# Patient Record
Sex: Female | Born: 1935 | Race: White | Hispanic: No | Marital: Married
Health system: Midwestern US, Academic
[De-identification: ages and names within clinical notes are randomized; demographics above are authoritative.]

---

## 2002-08-08 NOTE — Unmapped (Signed)
Signed by Meryle Ready MD on 08/08/2002 at 00:00:00  [facility]      Imported By: Betsey Amen 11/21/2007 12:07:26    _____________________________________________________________________    External Attachment:    Please see [facility] EMR for this document.

## 2002-10-22 NOTE — Unmapped (Signed)
Signed by Meryle Ready MD on 10/22/2002 at 00:00:00  [facility]      Imported By: Betsey Amen 11/21/2007 12:05:17    _____________________________________________________________________    External Attachment:    Please see [facility] EMR for this document.

## 2003-02-21 NOTE — Unmapped (Signed)
Signed by Meryle Ready MD on 02/21/2003 at 00:00:00  PET Scan      Imported By: Betsey Amen 11/21/2007 13:11:02    _____________________________________________________________________    External Attachment:    Please see [facility] EMR for this document.

## 2005-04-12 NOTE — Unmapped (Signed)
Signed by Elberta Fortis on 04/12/2005 at 13:02:38    Phone Note   Patient Call  Call back at Home Phone: [phone]  Caller: patient  Department: [facility]  Call for: RADIOLOGY     Summary of Call: PATIENT WOULD LIKE MRI STUDY'S DONE ON 08.31.04 AND 02.07.05 AND FORWARDED TO DR. BOB LUKIN CHAIR AT [facility].  HE WOULD LIKE TO COMPAIRED WITH STUDY DONE A FEW DAYS AGO.      LW [id number] WOULD LIKE TO BE NOTIFIED WHEN COPIED AND WOULD LIKE TO KNOW IF MESSENGER CAN FORWARD.     Initial call taken by: Elberta Fortis,  April 12, 2005 1:01 PM    This department is not currently using the EMR system. Please see the paper chart for this patient for the response to this phone message.

## 2005-05-27 ENCOUNTER — Inpatient Hospital Stay

## 2005-05-27 NOTE — Unmapped (Signed)
Signed by   LinkLogic on 05/27/2005 at 14:24:36  Patient: Ashley Madden  Note: All result statuses are Final unless otherwise noted.    Tests: (1) NM-BONE IMAGING WHOLE BODY [id number])    Order NotePricilla Handler Order Number: [id number]    Order Note:     *** VERIFIED [facility]  Reason:  TC-72M MEDRONATE I.V. REPORT RESULTS TO DR.  Shalise Rosado FAX: [phone]  Dict.Staff: Lovey Newcomer    Verified By: Onalee Hua   Ver: 05/27/05   2:24 pm  Exams:  NM-BONE IMAGING WHOLE BODY      Clinical: Patient has history of breast cancer and is referred  for evaluation of possible metastatic disease.    Technical: Whole-body images were obtained following the  administration of the radionuclide intravenously.    Findings: Images demonstrate the expected normal distribution of  the radionuclide in the skull, pelvis, the rib cage including  the sternum and long bones of the extremities. There is  scoliosis involving the Mauri Tolen thoracic and lumbar spine  identified. There is increased uptake noted in the distal lumbar  spine more pronounced on the left.    There is evidence of arthritis in the cervical spine and the  extremities.    Impression: This is  an unremarkable bone scan without evidence  to suggest presence of metastatic disease. Findings in the Reona Zendejas  spine are consistent with spondylosis secondary to this  patient's pronounced scoliosis.  **** end of result ****    Note: An exclamation mark (!) indicates a result that was not dispersed into   the flowsheet.  Document Creation Date: 05/27/2005 2:24 PM  _______________________________________________________________________    (1) Order result status: Final  Collection or observation date-time: 05/27/2005 13:30:00  Requested date-time: 05/27/2005 10:54:00  Receipt date-time:   Reported date-time: 05/27/2005 14:24:30  Referring Physician: Dionisio Paschal Diangelo Radel  Ordering Physician: Dionisio Paschal Chastity Noland (LOWERE)  Specimen Source:   Source: QRS  Filler Order Number:  [id number]  Lab site: [facility]

## 2006-08-29 ENCOUNTER — Inpatient Hospital Stay

## 2006-08-29 NOTE — Unmapped (Signed)
Signed by   LinkLogic on 08/29/2006 at 15:52:20  Patient: Ashley Madden  Note: All result statuses are Final unless otherwise noted.    Tests: (1) CT-PELVIS WITH CONTRAST [phone])    Order NotePricilla Handler Order Number: [id number]    Order Note:     *** VERIFIED [facility]  Reason:  BREAST CA,NON-HODGKINS LYMPHOMA  Dict.Staff: Lum Babe [phone]  Dict.Res: Leda Roys [id number]  Verified By: Lum Babe    Ver: 08/29/06   3:52 pm  Exams:  CT-ABDOMEN WITH CONTRAST  CT-PELVIS WITH CONTRAST    Exam: CT of the abdomen and pelvis with contrast performed on  08/29/2006    Comparison: None available    Indication: Breast cancer, non-Hodgkin's lymphoma    Technical factors: Following administration of 150 mL Isovue-370  intravenous contrast and water soluble oral contrast helically  acquired axial multidetector CT images were obtained from the  lung bases to the pelvis and reconstructed in contiguous 5 mm  sections using a 36 cm field-of-view.    Findings:    Chest findings will be reported separately.    The liver, adrenal glands, kidneys and pancreas are normal in  appearance.    The spleen is mildly enlarged.    Visualized portions of the bowel are normal in appearance.    There is no mesenteric or retroperitoneal adenopathy.    Atherosclerotic calcification of the origins of the celiac and  SMA are seen.    There are extensive degenerative changes in the lumbar spine. No  suspicious osteolytic or osteoblastic lesions are seen.    Impression:    Abdomen:  1. Mild splenomegaly.    Pelvis:  1. No evidence of metastatic disease.  **** end of result ****    Note: An exclamation mark (!) indicates a result that was not dispersed into   the flowsheet.  Document Creation Date: 08/29/2006 3:52 PM  _______________________________________________________________________    (1) Order result status: Final  Collection or observation date-time: 08/29/2006 10:20:43  Requested date-time: 08/29/2006  09:31:00  Receipt date-time:   Reported date-time: 08/29/2006 15:52:15  Referring Physician: Dionisio Paschal Katherene Dinino  Ordering Physician: Dionisio Paschal Ethel Veronica Sherryl Manges)  Specimen Source:   Source: QRS  Filler Order Number: [id number]  Lab site: [facility]

## 2006-08-29 NOTE — Unmapped (Signed)
Signed by   LinkLogic on 08/29/2006 at 15:02:17  Patient: Ashley Madden  Note: All result statuses are Final unless otherwise noted.    Tests: (1) CT-CHEST W CONTRAST [phone])    Order NotePricilla Handler Order Number: [id number]    Order Note:     *** VERIFIED [facility]  Reason:  BREAST CA,NON-HODGKINS LYMPHOMA  Dict.Staff: Horton Finer [phone]  Dict.Res: Henderson Cloud [id number]  Verified By: Shyrl Numbers A    Ver: 08/29/06   3:02 pm  Exams:  CT-CHEST W CONTRAST      CT of the chest with contrast dated 08/29/2006    Indication:  Breast cancer and non-Hodgkin's lymphoma.    Comparison:  None available.    Technique:  Helically acquired axial multidetector CT images  were obtained from the apices through the upper abdomen  following the administration of 150 ml of Isovue 370 intravenous  contrast  with a field-of-view of 36.  Images were reconstructed  at 5 mm.    Findings:    The central airways are widely patent.    Minimal irregular subpleural linear opacities are seen in  apices, likely postinflammatory change. These were seen on the  prior cervical spine CT in 2004. Parenchymal hands are seen the  bases bilaterally. No masses or pulmonary nodules are identified.    The patient is status post left mastectomy. There are  calcifications in the left anterior descending artery. The  pericardium and great vessels are normal in appearance.  No  mediastinal, hilar or axillary lymphadenopathy is identified.    No osteolytic or osteoblastic bone lesions are identified.    Abdomen and pelvis findings will be reported separately.    Impression:    No evidence of intrathoracic metastatic disease.  **** end of result ****    Note: An exclamation mark (!) indicates a result that was not dispersed into   the flowsheet.  Document Creation Date: 08/29/2006 3:02 PM  _______________________________________________________________________    (1) Order result status: Final  Collection or observation date-time:  08/29/2006 10:20:39  Requested date-time: 08/29/2006 09:31:00  Receipt date-time:   Reported date-time: 08/29/2006 15:02:25  Referring Physician: Dionisio Paschal Faylene Allerton  Ordering Physician: Dionisio Paschal Cagney Steenson Sherryl Manges)  Specimen Source:   Source: QRS  Filler Order Number: [id number]  Lab site: [facility]

## 2006-08-29 NOTE — Unmapped (Signed)
Signed by   LinkLogic on 08/29/2006 at 15:52:19  Patient: Ashley Madden  Note: All result statuses are Final unless otherwise noted.    Tests: (1) CT-ABDOMEN WITH CONTRAST [phone])    Order NotePricilla Handler Order Number: [id number]    Order Note:     *** VERIFIED [facility]  Reason:  BREAST CA,NON-HODGKINS LYMPHOMA  Dict.Staff: Lum Babe [phone]  Dict.Res: Leda Roys [id number]  Verified By: Lum Babe    Ver: 08/29/06   3:52 pm  Exams:  CT-ABDOMEN WITH CONTRAST  CT-PELVIS WITH CONTRAST    Exam: CT of the abdomen and pelvis with contrast performed on  08/29/2006    Comparison: None available    Indication: Breast cancer, non-Hodgkin's lymphoma    Technical factors: Following administration of 150 mL Isovue-370  intravenous contrast and water soluble oral contrast helically  acquired axial multidetector CT images were obtained from the  lung bases to the pelvis and reconstructed in contiguous 5 mm  sections using a 36 cm field-of-view.    Findings:    Chest findings will be reported separately.    The liver, adrenal glands, kidneys and pancreas are normal in  appearance.    The spleen is mildly enlarged.    Visualized portions of the bowel are normal in appearance.    There is no mesenteric or retroperitoneal adenopathy.    Atherosclerotic calcification of the origins of the celiac and  SMA are seen.    There are extensive degenerative changes in the lumbar spine. No  suspicious osteolytic or osteoblastic lesions are seen.    Impression:    Abdomen:  1. Mild splenomegaly.    Pelvis:  1. No evidence of metastatic disease.  **** end of result ****    Note: An exclamation mark (!) indicates a result that was not dispersed into   the flowsheet.  Document Creation Date: 08/29/2006 3:52 PM  _______________________________________________________________________    (1) Order result status: Final  Collection or observation date-time: 08/29/2006 10:20:35  Requested date-time: 08/29/2006  09:31:00  Receipt date-time:   Reported date-time: 08/29/2006 15:52:15  Referring Physician: Dionisio Paschal Joclyn Alsobrook  Ordering Physician: Dionisio Paschal Ellanie Oppedisano Sherryl Manges)  Specimen Source:   Source: QRS  Filler Order Number: [id number]  Lab site: [facility]

## 2006-08-29 NOTE — Unmapped (Signed)
Signed by   LinkLogic on 08/30/2006 at 13:12:40  Patient: Ashley Madden  Note: All result statuses are Final unless otherwise noted.    Tests: (1) MRI-L-SPINE W/WO CONTRAST [id number])    Order NotePricilla Handler Order Number: [id number]    Order Note:     *** VERIFIED [facility]  Reason:  HX OF BREAST CA, LYMPHOMA, F/U SCAN  Dict.Staff: Ursula Alert [phone]  Dict.Res: Nash Dimmer  Verified By: Burt Ek A      Ver: 08/30/06   1:12 pm  Exams:  MRI-L-SPINE W/WO CONTRAST  MRI-T-SPINE W/WO CONTRAST    Exam: MRI of thoracic and lumbar spine with and without contrast.    History: History of breast cancer and lymphoma for followup.    Comparison: 02 May 2003.    Technique: Multiplanar multiecho imaging of the thoracic and  lumbar spine was performed pre- and post uneventful  administration of 13 mL Magnevist IV.    Findings: Thoracic spine    On the scout images, there is mild retrolisthesis of C5 with  respect to C6. Spondylitic changes are seen within the cervical  spine, not directly imaged. There is normal anatomic alignment  of the thoracic spine. There are multilevel endplate  irregularities, consistent Schmorl's nodes.  A likely hemangioma  is seen within the T9 vertebral body, stable.    Since the prior examination, there is a new superior endplate  compression deformity involving the T4 vertebral body.There is a  region of decreased T1 and increased T2 signal intensity within  the T3 vertebral body which enhances after contrast  administration.  No additional abnormal regions of enhancement  are seen. These findings are improved from previous examination.    Axial imaging through the thoracic spine demonstrates the  following:    T6-T7: There is a left central disc herniation with flattening  of the ventral thecal sac and cord on the left. This appears  essentially stable from previous examination.    T7/8: There is a mild right paracentral disc osteophyte complex  which mildly  flattens the ventral thecal sac. This also appears  stable.    Impressions:  1. Region of enhancement involving the T3 vertebral body. No  additional abnormal regions of enhancement are seen. These  findings are improved from previous examination.  2. New superior endplate compression deformity involving T4.  3. Disc disease at T6-T7 and T7/8. There is flattening of the  ventral sac and cord at the T6/7 level, stable.    Findings: Lumbar spine    There is convex-right curvature of the lumbar spine. Alignment  is otherwise maintained without fracture or dislocation. There  are multilevel Schmorl's nodes, as well as endplate degenerative  changes at L4/5 and L5/S1. Marrow signal is otherwise  unremarkable for age without evidence of infiltrative process.  There is no abnormal enhancement postcontrast administration.  There is multilevel disc desiccation with narrowing of the  intervertebral disc space at L4/5. A root sleeve diverticulum  versus cyst is seen the S2/3 level. The conus is normal in size,  configuration, and signal intensity, terminating at L1/2. There  is slight disc bulge at L1/2.    Axial imaging through the intervertebral disc spaces  demonstrates the following:    L2/3: There is a diffuse disc bulge which extends into the right      neural foramina. This, combined with left greater than right  facet arthropathy and minimal ligamentum flavum infolding,  results in moderate left and  mild right neural foraminal  narrowing. In addition, there is mild left and minimal right  noncompressive lateral recess compromise.    L3/4: There is a diffuse disc bulge which combined with mild  facet and ligamentum flavum hypertrophy results in the mild to  moderate left and mild right neural foraminal narrowing. There  is minimal bilateral noncompressive lateral recess compromise.    L4/5: There is a diffuse disc bulge with right greater than left  facet arthropathy. This results in moderate right and mild left  neural  foraminal narrowing, as well as minimal right  noncompressive lateral recess compromise.    L5/S1: There are diffuse hypertrophic degenerative changes with  superimposed left foraminal disc protrusion. In addition, there  is a right greater than left facet arthropathy. These changes  result in moderate left neural foraminal narrowing, as well as  minimal noncompressive bilateral lateral recess compromise.    Impressions:  1. No evidence of marrow infiltrative process or abnormal  enhancement within the lumbar spine.  2. Multilevel disc disease as described, most pronounced at  L5/S1.  **** end of result ****    Note: An exclamation mark (!) indicates a result that was not dispersed into   the flowsheet.  Document Creation Date: 08/30/2006 1:12 PM  _______________________________________________________________________    (1) Order result status: Final  Collection or observation date-time: 08/29/2006 14:08:34  Requested date-time: 08/29/2006 12:05:00  Receipt date-time:   Reported date-time: 08/30/2006 13:12:48  Referring Physician: Dionisio Paschal Stevana Dufner  Ordering Physician: Dionisio Paschal Charly Hunton Sherryl Manges)  Specimen Source:   Source: QRS  Filler Order Number: [id number]  Lab site: [facility]

## 2006-08-29 NOTE — Unmapped (Signed)
Signed by   LinkLogic on 08/30/2006 at 13:12:40  Patient: Ashley Madden  Note: All result statuses are Final unless otherwise noted.    Tests: (1) MRI-T-SPINE W/WO CONTRAST [phone])    Order NotePricilla Handler Order Number: [id number]    Order Note:     *** VERIFIED [facility]  Reason:  HX OF BREAST CA, LYMPHOMA, F/U SCAN  Dict.Staff: Ursula Alert [phone]  Dict.Res: Nash Dimmer  Verified By: Burt Ek A      Ver: 08/30/06   1:12 pm  Exams:  MRI-L-SPINE W/WO CONTRAST  MRI-T-SPINE W/WO CONTRAST    Exam: MRI of thoracic and lumbar spine with and without contrast.    History: History of breast cancer and lymphoma for followup.    Comparison: 02 May 2003.    Technique: Multiplanar multiecho imaging of the thoracic and  lumbar spine was performed pre- and post uneventful  administration of 13 mL Magnevist IV.    Findings: Thoracic spine    On the scout images, there is mild retrolisthesis of C5 with  respect to C6. Spondylitic changes are seen within the cervical  spine, not directly imaged. There is normal anatomic alignment  of the thoracic spine. There are multilevel endplate  irregularities, consistent Schmorl's nodes.  A likely hemangioma  is seen within the T9 vertebral body, stable.    Since the prior examination, there is a new superior endplate  compression deformity involving the T4 vertebral body.There is a  region of decreased T1 and increased T2 signal intensity within  the T3 vertebral body which enhances after contrast  administration.  No additional abnormal regions of enhancement  are seen. These findings are improved from previous examination.    Axial imaging through the thoracic spine demonstrates the  following:    T6-T7: There is a left central disc herniation with flattening  of the ventral thecal sac and cord on the left. This appears  essentially stable from previous examination.    T7/8: There is a mild right paracentral disc osteophyte complex  which mildly  flattens the ventral thecal sac. This also appears  stable.    Impressions:  1. Region of enhancement involving the T3 vertebral body. No  additional abnormal regions of enhancement are seen. These  findings are improved from previous examination.  2. New superior endplate compression deformity involving T4.  3. Disc disease at T6-T7 and T7/8. There is flattening of the  ventral sac and cord at the T6/7 level, stable.    Findings: Lumbar spine    There is convex-right curvature of the lumbar spine. Alignment  is otherwise maintained without fracture or dislocation. There  are multilevel Schmorl's nodes, as well as endplate degenerative  changes at L4/5 and L5/S1. Marrow signal is otherwise  unremarkable for age without evidence of infiltrative process.  There is no abnormal enhancement postcontrast administration.  There is multilevel disc desiccation with narrowing of the  intervertebral disc space at L4/5. A root sleeve diverticulum  versus cyst is seen the S2/3 level. The conus is normal in size,  configuration, and signal intensity, terminating at L1/2. There  is slight disc bulge at L1/2.    Axial imaging through the intervertebral disc spaces  demonstrates the following:    L2/3: There is a diffuse disc bulge which extends into the right      neural foramina. This, combined with left greater than right  facet arthropathy and minimal ligamentum flavum infolding,  results in moderate left and  mild right neural foraminal  narrowing. In addition, there is mild left and minimal right  noncompressive lateral recess compromise.    L3/4: There is a diffuse disc bulge which combined with mild  facet and ligamentum flavum hypertrophy results in the mild to  moderate left and mild right neural foraminal narrowing. There  is minimal bilateral noncompressive lateral recess compromise.    L4/5: There is a diffuse disc bulge with right greater than left  facet arthropathy. This results in moderate right and mild left  neural  foraminal narrowing, as well as minimal right  noncompressive lateral recess compromise.    L5/S1: There are diffuse hypertrophic degenerative changes with  superimposed left foraminal disc protrusion. In addition, there  is a right greater than left facet arthropathy. These changes  result in moderate left neural foraminal narrowing, as well as  minimal noncompressive bilateral lateral recess compromise.    Impressions:  1. No evidence of marrow infiltrative process or abnormal  enhancement within the lumbar spine.  2. Multilevel disc disease as described, most pronounced at  L5/S1.  **** end of result ****    Note: An exclamation mark (!) indicates a result that was not dispersed into   the flowsheet.  Document Creation Date: 08/30/2006 1:12 PM  _______________________________________________________________________    (1) Order result status: Final  Collection or observation date-time: 08/29/2006 14:08:45  Requested date-time: 08/29/2006 12:05:00  Receipt date-time:   Reported date-time: 08/30/2006 13:12:48  Referring Physician: Dionisio Paschal Devlynn Knoff  Ordering Physician: Dionisio Paschal Loriana Samad Sherryl Manges)  Specimen Source:   Source: QRS  Filler Order Number: [id number]  Lab site: [facility]

## 2007-02-20 NOTE — Unmapped (Signed)
Signed by   LinkLogic on 02/20/2007 at 14:58:28    Appointment status changed to no show by  LinkLogic on 02/20/2007 2:58 PM.    No Show Comments  ----------------  (MOF) ELECTROMYOGRAPHY    Appointment Information  -----------------------  Appt Type:         Date:  Monday, February 20, 2007       Time:  2:45 PM for 45 min    Urgency:  Routine    Made By:  Berton Lan   To Visit:  EMG PROVIDER     Reason:  (MOF) ELECTROMYOGRAPHY    Appt Comments  -------------  -- 02/20/07 14:58: (DODDSL) NO SHOW --  (MOF) ELECTROMYOGRAPHY  EM[facility] LUE DX: NUMBNESS  FAX: [phone]    -- 02/13/07 10:37: Art Buff) BOOKED --  Routine  at 02/20/2007 2:45 PM for 45 min  (MOF) ELECTROMYOGRAPHY  EMG LUE DX: NUMBNESS  FAX: [phone]

## 2007-06-30 NOTE — Unmapped (Signed)
Signed by Meryle Ready MD on 06/30/2007 at 00:00:00  Office Visit      Imported By: Betsey Amen 11/21/2007 12:07:59    _____________________________________________________________________    External Attachment:    Please see [facility] EMR for this document.

## 2007-08-11 NOTE — Unmapped (Signed)
Signed by Meryle Ready MD on 08/11/2007 at 00:00:00  Office Visit      Imported By: Betsey Amen 11/21/2007 12:07:49    _____________________________________________________________________    External Attachment:    Please see [facility] EMR for this document.

## 2007-09-29 NOTE — Unmapped (Signed)
Signed by Meryle Ready MD on 09/29/2007 at 00:00:00  Office Visit      Imported By: Betsey Amen 11/21/2007 12:07:40    _____________________________________________________________________    External Attachment:    Please see [facility] EMR for this document.

## 2007-10-30 ENCOUNTER — Inpatient Hospital Stay

## 2007-10-30 NOTE — Unmapped (Signed)
Signed by Leanord Hawking Melissaann Dizdarevic MD on 10/31/2007 at 10:40:07  Patient: Ashley Madden  Note: All result statuses are Final unless otherwise noted.    Tests: (1) POC CREATININE (POCCRE)  ! POC Creatinine            0.8 mg/dL                   1.6-1.0    Note: An exclamation mark (!) indicates a result that was not dispersed into   the flowsheet.  Document Creation Date: 10/30/2007 2:44 PM  _______________________________________________________________________    (1) Order result status: Final  Collection or observation date-time: 10/30/2007 13:02  Requested date-time: 10/30/2007 13:02  Receipt date-time: 10/30/2007 14:48  Reported date-time: 10/30/2007 14:44  Referring Physician: Dionisio Paschal Okechukwu Regnier  Ordering Physician: Dionisio Paschal Kenedee Molesky [phone])  Specimen Source: VB&BLOOD, VENOUS     SYG&SYRINGE  Source: Faith Rogue Order Number: [id number]  Lab site: [facility]      [address] [place] [address]  [phone]

## 2007-10-30 NOTE — Unmapped (Signed)
Signed by Meryle Ready MD on 10/30/2007 at 00:00:00  CT Chest      Imported By: Betsey Amen 11/21/2007 12:04:31    _____________________________________________________________________    External Attachment:    Please see [facility] EMR for this document.

## 2007-11-01 ENCOUNTER — Inpatient Hospital Stay

## 2007-11-01 NOTE — Unmapped (Signed)
Signed by   LinkLogic on 11/01/2007 at 11:37:50  Patient: Ashley Madden  Note: All result statuses are Final unless otherwise noted.    Tests: (1) MRI-T-SPINE W CONTRAST [phone])    Order NotePricilla Handler Order Number: [id number]    Order Note:     *** VERIFIED [facility]  Reason:  BREAST CA  Dict.Staff: Jena Gauss [phone]    Verified By: Jena Gauss           Ver: 11/01/07  11:37 am  Exams:  MRI-L-SPINE W CONTRAST    Patient name: Ashley Madden    Examination: MR of the thoracic and lumbar spine with contrast  T1-weighted sequences only    Person: Previous noncontrast MR of the thoracic and lumbar spine  dated October 30, 2006, and previous MR's of the thoracic and  lumbar spine from 08/29/1998 88    History: Patient with known breast carcinoma and diffuse  metastasis related to this. Develops second primary of lymphoma.  Prior exam 2 days ago showed a T3 paraspinal soft tissue mass.    Findings:    MR of THORACIC SPINE --  As previously demonstrated left paraspinal, extra  canalicular soft tissue mass at the T3 level demonstrates  homogeneous abnormal contrast-enhancement and confirms that this  tumor is extended into the left T3/4 neural foramen and presents  as an intradural extramedullary lesion on the left side causing  mild compression and displacement of the thoracic spinal cord.  The appearance and location of this soft tissue mass would tend  to favor the known diagnosis of lymphoma.    There are considerable pulsation artifacts from the sagittal T1  fat-saturated postcontrast enhanced images making it difficult  to assess degree of enhancement in the thoracic vertebra.  However, the precontrast T1 sagittal images demonstrates  heterogeneity of marrow signal also seen on the previous study  from October 12. Furthermore, on the previous sagittal  fat-saturated fast spin-echo images, there is also inhomogeneity  of marrow signal with multifocal areas of increased T2 signal as  well as what  appears to be hemangioma of the T9 vertebral body.  It cannot be stated with certainty whether or not there is  abnormal enhancement of the thoracic vertebral bodies due to the  extensive pulsation artifacts. No definite abnormal enhancement  of the vertebral bodies is noted on the sagittal postcontrast  enhanced images without fat saturation. Nevertheless, the MR  does suggest heterogeneity of the thoracic marrow diffusely  perhaps related to infiltrating process, possibly related to  patient's known breast carcinoma. This inhomogeneity is also  noted on previous exams. However, the heights of the thoracic  vertebral bodies, and cortical margins as well as disc spaces  are preserved. There is no associated soft tissue mass extending  out from the vertebral bodies except for the left paraspinal  soft tissue mass at the T3 level.    Impression:    Enhancement of T3 left paraspinal soft tissue mass with  extension into foramen into the spinal canal most consistent  with patient's known diagnosis of lymphoma. Heterogeneity of the  thoracic vertebral marrow suggestive of infiltrative process.  This heterogeneity of the thoracic vertebral marrow is also  noted in 08/29/2006 and is basically unchanged    MR of LUMBAR SPINE--  There is again noted unchanged since 2005 a Tarlov's cyst,  or more likely an arachnoid cyst at the S2 level causing  scalloping of the posterior cortical margin. There does not  appear to be any abnormal contrast enhancement of the conus  medullaris or of the roots of the cauda equina. There does    appear to be patchy focal areas of abnormal enhancement in the  lumbar vertebral bodies on the fat saturation images as well as  some heterogeneity of signal. The enhancement does occur in  regions of Schmorl's nodes, and may just represent degenerative  changes. However, on the previous exam from October 12 and  T2-weighted fat-saturated images there also appears to be  heterogeneity diffusely of the  lumbar vertebra with focal areas  of increased T2 signal. The increased T2 signal heterogeneity is  seen much better on the sagittal topogram localizers in the  cervical thoracic and lumbar regions. Therefore, the patchy  areas of enhancement in the lumbar vertebral bodies raises the  question of a diffuse infiltrative process involving the lumbar  spine. However, the cortical margins of the lumbar vertebral  bodies are intact, and there is no evidence for soft tissue  tumor extending out from the margins of the bodies. These  heterogeneous changes in the lumbar spine were also noted back  and 08/29/2006 and are basically unchanged.    Impression:    No definite contrast enhancing mass lesions within the lumbar  spinal canal or related to the conus or roots of cauda equina.  Again, there is suggestion of heterogeneity of the lumbar  vertebral marrow suggesting infiltrative process, most likely  related to patient's previous breast cancer.  **** end of result ****    Order Note:   EMR Routing to: Alfredia Client, CNP - ordering - [id number]    Note: An exclamation mark (!) indicates a result that was not dispersed into   the flowsheet.  Document Creation Date: 11/01/2007 11:37 AM  _______________________________________________________________________    (1) Order result status: Final  Collection or observation date-time: 11/01/2007 08:51:15  Requested date-time: 11/01/2007 06:05:00  Receipt date-time:   Reported date-time: 11/01/2007 11:37:33  Referring Physician: Lenor Coffin  Ordering Physician: Lenor Coffin Algernon Huxley)  Specimen Source:   Source: QRS  Filler Order Number: [id number]  Lab site: [facility]

## 2007-11-01 NOTE — Unmapped (Signed)
Signed by   LinkLogic on 11/01/2007 at 11:37:49  Patient: Ashley Madden  Note: All result statuses are Final unless otherwise noted.    Tests: (1) MRI-L-SPINE W CONTRAST [phone])    Order NotePricilla Handler Order Number: [id number]    Order Note:     *** VERIFIED [facility]  Reason:  BREAST CA  Dict.Staff: Jena Gauss [phone]    Verified By: Jena Gauss           Ver: 11/01/07  11:37 am  Exams:  MRI-L-SPINE W CONTRAST    Patient name: Ashley Madden    Examination: MR of the thoracic and lumbar spine with contrast  T1-weighted sequences only    Person: Previous noncontrast MR of the thoracic and lumbar spine  dated October 30, 2006, and previous MR's of the thoracic and  lumbar spine from 08/29/1998 88    History: Patient with known breast carcinoma and diffuse  metastasis related to this. Develops second primary of lymphoma.  Prior exam 2 days ago showed a T3 paraspinal soft tissue mass.    Findings:    MR of THORACIC SPINE --  As previously demonstrated left paraspinal, extra  canalicular soft tissue mass at the T3 level demonstrates  homogeneous abnormal contrast-enhancement and confirms that this  tumor is extended into the left T3/4 neural foramen and presents  as an intradural extramedullary lesion on the left side causing  mild compression and displacement of the thoracic spinal cord.  The appearance and location of this soft tissue mass would tend  to favor the known diagnosis of lymphoma.    There are considerable pulsation artifacts from the sagittal T1  fat-saturated postcontrast enhanced images making it difficult  to assess degree of enhancement in the thoracic vertebra.  However, the precontrast T1 sagittal images demonstrates  heterogeneity of marrow signal also seen on the previous study  from October 12. Furthermore, on the previous sagittal  fat-saturated fast spin-echo images, there is also inhomogeneity  of marrow signal with multifocal areas of increased T2 signal as  well as what  appears to be hemangioma of the T9 vertebral body.  It cannot be stated with certainty whether or not there is  abnormal enhancement of the thoracic vertebral bodies due to the  extensive pulsation artifacts. No definite abnormal enhancement  of the vertebral bodies is noted on the sagittal postcontrast  enhanced images without fat saturation. Nevertheless, the MR  does suggest heterogeneity of the thoracic marrow diffusely  perhaps related to infiltrating process, possibly related to  patient's known breast carcinoma. This inhomogeneity is also  noted on previous exams. However, the heights of the thoracic  vertebral bodies, and cortical margins as well as disc spaces  are preserved. There is no associated soft tissue mass extending  out from the vertebral bodies except for the left paraspinal  soft tissue mass at the T3 level.    Impression:    Enhancement of T3 left paraspinal soft tissue mass with  extension into foramen into the spinal canal most consistent  with patient's known diagnosis of lymphoma. Heterogeneity of the  thoracic vertebral marrow suggestive of infiltrative process.  This heterogeneity of the thoracic vertebral marrow is also  noted in 08/29/2006 and is basically unchanged    MR of LUMBAR SPINE--  There is again noted unchanged since 2005 a Tarlov's cyst,  or more likely an arachnoid cyst at the S2 level causing  scalloping of the posterior cortical margin. There does not  appear to be any abnormal contrast enhancement of the conus  medullaris or of the roots of the cauda equina. There does    appear to be patchy focal areas of abnormal enhancement in the  lumbar vertebral bodies on the fat saturation images as well as  some heterogeneity of signal. The enhancement does occur in  regions of Schmorl's nodes, and may just represent degenerative  changes. However, on the previous exam from October 12 and  T2-weighted fat-saturated images there also appears to be  heterogeneity diffusely of the  lumbar vertebra with focal areas  of increased T2 signal. The increased T2 signal heterogeneity is  seen much better on the sagittal topogram localizers in the  cervical thoracic and lumbar regions. Therefore, the patchy  areas of enhancement in the lumbar vertebral bodies raises the  question of a diffuse infiltrative process involving the lumbar  spine. However, the cortical margins of the lumbar vertebral  bodies are intact, and there is no evidence for soft tissue  tumor extending out from the margins of the bodies. These  heterogeneous changes in the lumbar spine were also noted back  and 08/29/2006 and are basically unchanged.    Impression:    No definite contrast enhancing mass lesions within the lumbar  spinal canal or related to the conus or roots of cauda equina.  Again, there is suggestion of heterogeneity of the lumbar  vertebral marrow suggesting infiltrative process, most likely  related to patient's previous breast cancer.  **** end of result ****    Order Note:   EMR Routing to: Alfredia Client, CNP - ordering - [id number]    Note: An exclamation mark (!) indicates a result that was not dispersed into   the flowsheet.  Document Creation Date: 11/01/2007 11:37 AM  _______________________________________________________________________    (1) Order result status: Final  Collection or observation date-time: 11/01/2007 08:51:17  Requested date-time: 11/01/2007 06:05:00  Receipt date-time:   Reported date-time: 11/01/2007 11:37:33  Referring Physician: Lenor Coffin  Ordering Physician: Lenor Coffin Algernon Huxley)  Specimen Source:   Source: QRS  Filler Order Number: [id number]  Lab site: [facility]

## 2007-11-14 ENCOUNTER — Inpatient Hospital Stay: Attending: Hematology & Oncology

## 2007-11-23 NOTE — Unmapped (Signed)
Signed by Meryle Ready MD on 11/27/2007 at 07:25:19        A partnership of [facility] and [facility] Radiation Oncology      Reason for visit: Consultation  * This consultation was requested by [name], MD to evaluate the possible use of radiation therapy. A copy of this consultation is being forwarded with my opinion and recommendations.  Rad/Onc Provider: Lurline Idol MD      DIAGNOSIS    75 year old white female with 1. Breast cancer, diagnosed in 1998 ER-PR-  2. NHL, low grade B cell lymphoma, diagnosed in 2004      PREVIOUS TREATMENT   1998 - Surgery: left mastectomy  1998 - Chemotherapy: adjuvant chemotherapy per Dr Lower for breast cancer  2004-2009 - Chemotherapy: single agent rituxan every 6 weeks    HISTORY OF PRESENT ILLNESS  Mrs Soltis is a very pleasant 74 yo female we are seeing at the request of Dr Lower to discuss radiation therapy for her NHL.  She has a long history of breast cancer, the details of tx we do not have available.  She was diagnosed in 1998 and had a left mastectomy by Dr Eppie Gibson. She then had adjuvant chemotherapy per Dr Lower. She states her LN were not involved, and the tumor was ER-.  She then did well and was followed routinely by her doctors. She the developed significant neck pain in 2004. Apparently at this time imaging revealed an abnormality in her C5-6 and T12region and iliac crest.  She had bone marrow biopsies in 7/04 which was unable to ro underlying B cell lymphoproliferative disorder; She then had biopsy of her T12 vertebral body and this was confirmed as Low Grade B Cell Lymphoma.  Pet scan in 2/05 showed uptake in mid thoracic spine, lower left cerival spine, and possible subcarinal and hilar disease.  At this time she was started on single agent rituxan. She has taken this every 6 weeks for the past 5 years, and has tolerated it very well.  Her scans from 5/07-8/08 have not shown any significant disease. She then saw Dr Lower in 9/09 and had her routine  scans. This showed a new area at T3-4. MRI was then performed on 11/01/07 and showed a soft tissue mass from T3-4 w/ intradural extension and mild cord compression.  This was thought to favor lymphoma. She no other significant disease, aside from T2 herterogenitety in cervical and lumbar spine. She then had a CT guided biopsy by Dr Caleen Jobs which revealed benign lymphocytes and no malignancy. She then had a PET-CT on 11/2 which only showed disease in the T3 region, SUV only 2.4. There was no other evidence of disease.  She currently states she is feeling well. She has no pain in her back aside from chronic arthritis. She has numb/tingling fingers in her left hand, but states this has been like this for about 8 months. She is not having any change in bowels or bladder. She has had nocturia and some occasional incontinence for years and years.  She has no fevers, chills, nightsweats, or weight loss. She is on decadron 4 mg twice a day and is not tolerating this well. She feels off, dizzy, and not like herself.    Past History  Past Medical History (reviewed - no changes required):  Hyperlipidemia, Hypothyroidism, Cataracts, Eye Glasses, Shingles, Arthritis, Primary Breast cancer  Surgical History (reviewed - no changes required):  Appendectomy:*, Hysterectomy: *, Mastectomy: left, Knee Replacement: left  Social History (reviewed - no changes required): Marital Status: married,   Spouse: Norbert  Children: 7,   Employment Status: retired,   Patient Lives at: condo,   Exercise: lives in [place] half the year,   Tobacco Usage:non-smoker      REVIEW OF SYSTEMS   Ten system review (General/ Eyes/ ENT/ Cardiovascular/ Respiratory/ Gastrointestinal/ Genitourinary/ Musculoskeletal/ Skin and/ Neurologic) is negative except as noted in the HPI.        MEDICATIONS (on Intake):    SYNTHROID  TABS (LEVOTHYROXINE SODIUM TABS)   DEXAMETHASONE 4 MG TABS (DEXAMETHASONE) twice a day  ATENOLOL 50 MG TABS (ATENOLOL)   PREVACID   CPDR (LANSOPRAZOLE CPDR)   EVISTA 60 MG TABS (RALOXIFENE HCL)   ADULT ASPIRIN EC LOW STRENGTH 81 MG TBEC (ASPIRIN)   CITRACAL PLUS  TABS (MULTIPLE MINERALS-VITAMINS)         PHYSICAL EXAMINATION  Weight: 144 lbs./  0 kg.,   Temp. 98.1,   BP: 118/ 69 mm Hg,   Pulse Rate: 65,   Resp Rate: 16     Pain Score: 0       Constitutional: alert, no acute distress, well hydrated, well developed, well nourished;    Skin: normal turgor, normal color, no rashes;    Head: atraumatic, normocephalic;    Eyes: EOM intact, pupils equally reactive to light, normal light reaction, no nystagmus;    Ears/Nose/Throat: no pharyngeal erythema, tongue normal;    Neck: supple, no lymphadenopathy, no scars;    Chest: no chest wall tenderness, clear to auscultation, normal breath sounds, no dullness or hyperresonsance;    Cardiac:  regular rate and rhythm, no rubs, no murmurs;    Peripheral circulation: no cyanosis, no edema;    Extremities: full joint motion, no deformities, normal sensation;    Psych: oriented to time; place; and person, good eye contact, memory intact, normal cognition;    Neuro: cranial nerves grossly intact, DTR's normal, non focal, sensation intact, motor intact, speech intact, station & gait normal, appropriate mental status;  Cervical Nodes: normal  Clavicular Nodes normal  Axillary Nodes: normal          ASSESSMENT   Mrs Hamm is a 74 yo WF w/ history of breast cancer and NHL. She now has presumed progression of lymphoma w/ a paraspinal mass at T3-T4.    Disease Status:   local recurrence of disease      PLAN   We have recommended that she have radiation to her paraspinal mass.  She will get a total dose of 36 gray. We have discussed that potential side effects would be fatigue, skin irritation, and possible sore throat.  She would like to finish tx as soon as possible to get to [place].  We will set up a ct simulation tomorrow and begin tx monday. She will decrease her decadron to 2 mg three times a day.    PRECEPTOR  ACKNOWLEDGEMENTS    I saw and examined the patient.  I discussed with resident and agree with resident's findings and plan as documented in resident's note.    Comments: Mrs. Gannett is a delightful 75 year old white female, with a recurrent spinal lymphoma. On our planning CT, there is very clearly a threatenin cord compression.  We will begin her treatment immediately, and treat her over a course of 20 fractions in 4 weeks time.  Preceptor: Meryle Ready MD on November 27, 2007 7:25 AM

## 2007-11-24 ENCOUNTER — Inpatient Hospital Stay

## 2007-11-24 NOTE — Unmapped (Signed)
Signed by Meryle Ready MD on 11/24/2007 at 00:00:00  Procedure Consent      Imported By: Eather Colas Hummons 12/28/2007 11:09:32    _____________________________________________________________________    External Attachment:    Please see [facility] EMR for this document.

## 2007-11-24 NOTE — Unmapped (Signed)
Signed by   LinkLogic on 11/25/2007 at 10:32:33  Patient: Ashley Madden  Note: All result statuses are Final unless otherwise noted.    Tests: (1) CT-THERAPY FIELDS [id number])    Order NotePricilla Handler Order Number: [id number]    Order Note:     *** VERIFIED [facility]  Reason:  CT NECK,CHEST FOR TX PLANNING,DX:LYMPHOMA  Dict.Staff: Gwenyth Ober [phone]    Verified By: Gwenyth Ober      Ver: 11/25/07  10:32 am  Exams:  CT-THERAPY FIELDS      CT Scan performed on Nov 24, 2007 8:13:00 AM :    INDICATION:    Radiation Therapy Planning.    A CT Scan was performed for the purpose of Radiation Therapy  Planning.  Due to the nature of Radiation Therapy CT Scans, no  formal interpretation by a Radiologist will be made.    **** end of result ****    Order Note:   EMR Routing to: Meryle Ready, MD - ordering - [id number]    Note: An exclamation mark (!) indicates a result that was not dispersed into   the flowsheet.  Document Creation Date: 11/25/2007 10:32 AM  _______________________________________________________________________    (1) Order result status: Final  Collection or observation date-time: 11/24/2007 08:14:56  Requested date-time: 11/24/2007 08:13:00  Receipt date-time:   Reported date-time: 11/25/2007 10:32:29  Referring Physician: Cindi Carbon NON-STAFF  Ordering Physician: Lurline Idol [facility])  Specimen Source:   Source: QRS  Filler Order Number: [id number]  Lab site: [facility]

## 2007-11-27 NOTE — Unmapped (Signed)
Signed by Madden Ramp on 11/27/2007 at 10:21:33        A partnership of [facility] and [facility]          November 23, 2007    RE: Ashley Madden   DOB:  [birth year]    Dear Ashley Loser, MD,    It was a pleasure seeing your patient, Ashley Madden at your request for consultation.  Please find the evaluation of this visit below.    DIAGNOSIS:    1. Breast cancer, diagnosed in 1998 ER-PR-  2. NHL, low grade B cell lymphoma, diagnosed in 2004     HISTORY OF PRESENT ILLNESS:   Ashley Madden is a very pleasant 74 yo female we are seeing at the request of Ashley Madden to discuss radiation therapy for her NHL.  She has a long history of breast cancer, the details of tx we do not have available.  She was diagnosed in 1998 and had a left mastectomy by Ashley Madden. She then had adjuvant chemotherapy per Ashley Madden. She states her LN were not involved, and the tumor was ER-.  She then did well and was followed routinely by her doctors. She then developed significant neck pain in 2004. Apparently at this time imaging revealed an abnormality in her C5-6 and T12 region and iliac crest.  She had bone marrow biopsies in 7/04 which were unable to RO underlying B cell lymphoproliferative disorder. She then had biopsy of her T12 vertebral body and this was confirmed as Low Grade B Cell Lymphoma. Pet scan in 2/05 showed uptake in mid thoracic spine, Madden left cerival spine, and possible subcarinal and hilar disease.  At this time, she was started on single agent Rituxan. She has taken this every 6 weeks for the past 5 years, and has tolerated it very well.  Her scans from 5/07-8/08 have not shown any significant disease. She then saw Ashley Madden in 9/09 and had her routine scans. This showed a new area at T3-4. MRI was then performed on 11/01/07 and showed a soft tissue mass from T3-4 w/ intradural extension and mild cord compression.  This was thought to favor lymphoma. She no other significant disease, aside from  T2 herterogenitety in cervical and lumbar spine. She then had a CT guided biopsy by Ashley Ashley Madden which revealed benign lymphocytes and no malignancy. She then had a PET-CT on 11/2 which only showed disease in the T3 region, SUV only 2.4. There was no other evidence of disease. She currently states she is feeling well. She has no pain in her back aside from chronic arthritis. She has numb/tingling fingers in her left hand, but states this has been like this for about 8 months. She is not having any change in bowels or bladder. She has had nocturia and some occasional incontinence for years and years.  She has no fevers, chills, nightsweats, or weight loss. She is on Decadron 4 mg twice a day and is not tolerating this well. She feels off, dizzy, and not like herself.    DISEASE STATUS:  local recurrence of disease     ASSESSMENT:  Ashley Madden is a 74 yo WF w/ history of breast cancer and NHL. She now has presumed progression of lymphoma w/ a paraspinal mass at T3-T4.    PLAN:    We have recommended that she have radiation to her paraspinal mass.  She will get a total dose of 36 gray. We have discussed that  potential side effects would be fatigue, skin irritation, and possible sore throat.  She would like to finish tx as soon as possible to get to [place].  We will set up a ct simulation tomorrow and begin tx on Monday. She will decrease her Decadron to 2 mg three times a day.     Thank you for allowing me to participate in this patient's care.     Kind regards,      Ashley Ready, MD.  Radiation Oncologist

## 2007-11-30 NOTE — Unmapped (Signed)
Signed by Christel Mormon RN on 11/30/2007 at 16:03:51    PRELOAD    Coordinating Care Providers   Referring Physician: [name], MD  PCP Name: Jetty Peeks, MD  Radiation Oncologist: Lurline Idol MD    Diagnosis: 1. Breast cancer, diagnosed in 1998 ER-PR-  2. NHL, low grade B cell lymphoma, diagnosed in 2004      Preload Clinical Lists   Problems:   NON-HODGKIN'S LYMPHOMA (ICD-202.80)  CARCINOMA, BREAST (ICD-174.9)  HYPERTENSION (ICD-401.9)    Medications:   SYNTHROID  TABS (LEVOTHYROXINE SODIUM TABS)   DEXAMETHASONE 4 MG TABS (DEXAMETHASONE) twice a day  ATENOLOL 50 MG TABS (ATENOLOL)   PREVACID  CPDR (LANSOPRAZOLE CPDR)   EVISTA 60 MG TABS (RALOXIFENE HCL)   ADULT ASPIRIN EC LOW STRENGTH 81 MG TBEC (ASPIRIN)   CITRACAL PLUS  TABS (MULTIPLE MINERALS-VITAMINS)       Allergies:  No Known Allergies

## 2007-12-04 NOTE — Unmapped (Signed)
Signed by Meryle Ready MD on 12/04/2007 at 10:41:56        [facility]  THE [facility]  [place], [place]  [address]      Demographic Update       NURSING ASSESSMENT  Appetite: good  Sleeping: waking  Nausea: no  Vomiting: no  Diarrhea: no  Fatigue:     Feeling tired: yes    Napping Frequently: yes  Pain Level: 0    (Note: Pain level is assessed on a 10 point scale).Nurse: Neil Crouch RN      PATIENT PROFILE   74 years old white female with 1. Breast cancer, diagnosed in 1998 ER-PR-  2. NHL, low grade B cell lymphoma, diagnosed in 2004    Current Radiation Dose: 1080  Total Prescribed Dose: 3600     PHYSICIAN NOTE   Ms. Cosma notes today that she has a sore throat, thinks it may be from treatment, alxo notes mouth pain. Oral cavity and oropharynx without change or injection noted.      STAFF PHYSICIAN: Lurline Idol MD  RESIDENT: Marshall Cork, MD  TREATMENT UNIT: [facility]    MEDICATIONS (on Intake)  SYNTHROID  TABS (LEVOTHYROXINE SODIUM TABS)   DEXAMETHASONE 4 MG TABS (DEXAMETHASONE) twice a day  ATENOLOL 50 MG TABS (ATENOLOL)   PREVACID  CPDR (LANSOPRAZOLE CPDR)   EVISTA 60 MG TABS (RALOXIFENE HCL)   ADULT ASPIRIN EC LOW STRENGTH 81 MG TBEC (ASPIRIN)   CITRACAL PLUS  TABS (MULTIPLE MINERALS-VITAMINS)           Height: 64 in.,   Weight: 140.75 lbs.Temp. 97.1,   BP: 130/ 80 mm Hg,   Pulse Rate: 54,   Resp Rate: 18     Pain Score: 0             ASSESSMENT   Continue steroids at current dose given persistent neuropathy        PLAN   Continue treatment, add nystatin for sore throat.    MEDICATIONS   MILES MIXTURE Nystatin 2,000,000 U , Tetricycline 1 gram, Hydrocortisone 20 mg, Benadryl Elixir 1 tsp three times a day  #480 ml x 6, 12/04/2007, Marshall Cork MD  MILES MIXTURE Nystatin 2,000,000 U , Tetricycline 1 gram, Hydrocortisone 20 mg, Benadryl Elixir  #480 ml x 6, 12/04/2007, Marshall Cork MD        Prescriptions:  MILES MIXTURE Nystatin 2,000,000 U , Tetricycline 1 gram, Hydrocortisone 20 mg,  Benadryl Elixir 1 tsp three times a day  #480 ml x 6   Entered and Authorized by: Marshall Cork MD   Signed by: Marshall Cork MD on 12/04/2007   Method used: Print then Give to Patient   RxID: [id number]  MILES MIXTURE Nystatin 2,000,000 U , Tetricycline 1 gram, Hydrocortisone 20 mg, Benadryl Elixir  #480 ml x 6   Entered and Authorized by: Marshall Cork MD   Signed by: Marshall Cork MD on 12/04/2007   Method used: Print then Give to Patient   RxID: [id number]    ]

## 2007-12-06 NOTE — Unmapped (Signed)
Signed by Meryle Ready MD on 12/06/2007 at [facility]        [facility]  THE [facility]  [place], [place]  [address]      Demographic Update       NURSING ASSESSMENT  Appetite: good  Nausea: no  Vomiting: no  Diarrhea: yes, Take Immodium  Fatigue:   Pain Level: 0    (Note: Pain level is assessed on a 10 point scale).Nurse: Neil Crouch RN      PATIENT PROFILE   74 years old white female with 1. Breast cancer, diagnosed in 1998 ER-PR-  2. NHL, low grade B cell lymphoma, diagnosed in 2004    Current Radiation Dose: 1440  Total Prescribed Dose: 3600    PHYSICIAN NOTE   Her throat is still scratchy, and she tried miles mixture for the first time this morning. Still no injection.Her skin is very dry.      RESIDENT: Marshall Cork, MD  TREATMENT UNIT: [facility]    MEDICATIONS (on Intake)  SYNTHROID  TABS (LEVOTHYROXINE SODIUM TABS)   DEXAMETHASONE 4 MG TABS (DEXAMETHASONE) twice a day  ATENOLOL 50 MG TABS (ATENOLOL)   PREVACID  CPDR (LANSOPRAZOLE CPDR)   EVISTA 60 MG TABS (RALOXIFENE HCL)   ADULT ASPIRIN EC LOW STRENGTH 81 MG TBEC (ASPIRIN)   CITRACAL PLUS  TABS (MULTIPLE MINERALS-VITAMINS)   * MILES MIXTURE Nystatin 2,000,000 U , Tetricycline 1 gram, Hydrocortisone 20 mg, Benadryl Elixir 1 tsp three times a day          Height: 64 in.,   Weight: 143 lbs.Temp. 97,   BP: 118/ 70 mm Hg,   Pulse Rate: 92,   Resp Rate: 18     Pain Score: 0             ASSESSMENT   Give aquaphor.  Skin dry.        PLAN   Continue treatment.        ]

## 2007-12-13 NOTE — Unmapped (Signed)
Signed by Meryle Ready MD on 12/13/2007 at [facility]        [facility]  THE [facility]  [place], [place]  [address]      Demographic Update       NURSING ASSESSMENT  Appetite: good, sometimes feels full  Sleeping: no problems  Nausea: no  Vomiting: no  Fatigue:     Feeling tired: no    Napping Frequently: no    Energy Level: yes  Pain Level: 2    (Note: Pain level is assessed on a 10 point scale).  Pain Pain Location: mild sore throat  Skin Reactions: pink or redNurse: Christel Mormon RN      PATIENT PROFILE   74 years old white female with 1. Breast cancer, diagnosed in 1998 ER-PR-  2. NHL, low grade B cell lymphoma, diagnosed in 2004    Current Radiation Dose: 2420  Total Prescribed Dose: 3600    PHYSICIAN NOTE   Patient still having sore throat and issues with steroids.  Bloat, visual effects. Bleeding easly secondary to low platelets. Still with   neuropathy.      STAFF PHYSICIAN: Lurline Idol MD  RESIDENT: Marshall Cork, MD  TREATMENT UNIT: [facility]      Height: 64 in.,   Weight: 145 lbs.Temp. 97.5,   BP: 128/ 70 mm Hg,   Pulse Rate: 58,   Resp Rate: 18     Pain Score: 2             ASSESSMENT   Not much response        PLAN   Continue treatment.        ]

## 2007-12-20 NOTE — Unmapped (Signed)
Signed by Lenox Ahr MD on 12/20/2007 at [facility]        [facility]  THE [facility]  [place], [place]  [address]      Demographic Update       NURSING ASSESSMENT  Appetite: good, but experiences bloating  Nausea: no  Vomiting: no  Diarrhea: no, but reports frquency of stools up to 12/day  Fatigue:     Feeling tired: yes    Energy Level: yes, 5/10  Pain Level: 0    (Note: Pain level is assessed on a 10 point scale).Nurse: Neil Crouch RN      PATIENT PROFILE   74 years old white female with 1. Breast cancer, diagnosed in 1998 ER-PR-  2. NHL, low grade B cell lymphoma, diagnosed in 2004    Current Radiation Dose: 32.4  Total Prescribed Dose: 36    RESIDENT NOTE   Mrs Bolda is not tolerating steriods well. She feels very bloated, having visual symptoms, feels spacey, having trouble sleeping. She is on 2 mg twice a day of decadron. She is having more cough with thick yellow sputum and nasal discharge and frontal sinus pain. She does not feel the left hand tingling is stable, no change w/ tx or steriods.  Feels tired this week.    PHYSICIAN NOTE   cough and thik yellow sputum  nochange in L finger paresthesias since start of RT, but having steroid side effects      STAFF PHYSICIAN: Lenox Ahr MD  RESIDENT: Marshall Cork, MD  TREATMENT UNIT: [facility]    MEDICATIONS (on Intake)  SYNTHROID  TABS (LEVOTHYROXINE SODIUM TABS)   DEXAMETHASONE 4 MG TABS (DEXAMETHASONE) twice a day  ATENOLOL 50 MG TABS (ATENOLOL)   PREVACID  CPDR (LANSOPRAZOLE CPDR)   EVISTA 60 MG TABS (RALOXIFENE HCL)   ADULT ASPIRIN EC LOW STRENGTH 81 MG TBEC (ASPIRIN)   CITRACAL PLUS  TABS (MULTIPLE MINERALS-VITAMINS)   * MILES MIXTURE Nystatin 2,000,000 U , Tetricycline 1 gram, Hydrocortisone 20 mg, Benadryl Elixir 1 tsp three times a day          PHYSICAL EXAMINATION  Height: 64 in.,   Weight: 146 lbs.Temp. 97.5,   BP: 144/ 85 mm Hg,   Pulse Rate: 83,   Resp Rate: 20     Pain Score: 0       Constitutional: alert, no acute  distress, well hydrated, well developed;    Ears/Nose/Throat: no pharyngeal erythema, tongue normal;  slight frontal sinus tenderness.            ASSESSMENT   Pt with increased steriod side effects. Hand numbness stable. Likely sinus infection.      Performance Status Scores:      Karnofsky: 90    PLAN   She will finish tx on Friday. She will call when she is back on 12/14 for chemo if having any problems and to schedule her f/u. We will order an MRI and see her for f/u when she is here for her 2nd cycle of chemo in 01/2008. She will call in 12/09 to schedule when she knows when she will be back from [place].   We will decrease her decadron to 2 mg every day for 1 week, then every other day for 1 week, then stop. Her numbness is unchanged, and may or may not be related to her lymphoma.  We have also started her on a z-pack for her sinus infection.    MEDICATIONS   ZITHROMAX Z-PAK  TABS (AZITHROMYCIN TABS) as directed  #1 x 0, 12/20/2007, Marshall Cork MD    DISPOSITION     1.)   Return to clinic for Follow-up visit in 6 weeks   Preceptor Acknowledgements    I saw and examined the patient.  I discussed with the resident or fellow and agree with resident's/fellow's findings and plan as documented in the note.  A&P f/u with Dr Lower 12-14  f/u MRI 1/10    Preceptor: Lenox Ahr MD on December 20, 2007 8:58 AM        Prescriptions:  Rhys Martini   TABS (AZITHROMYCIN TABS) as directed  #1 x 0   Entered and Authorized by: Marshall Cork MD   Signed by: Marshall Cork MD on 12/20/2007   Method used: Print then Give to Patient   RxID: [id number]    ]

## 2007-12-22 NOTE — Unmapped (Signed)
Signed by Meryle Ready MD on 12/25/2007 at [facility]        [facility]  THE [facility]  [place], [place]  [address]    Demographic Update     FINAL RADIATION THERAPY NOTE     OTHER PHYSICIANS INVOLVED INCLUDE:     PATIENT PROFILE:   74 year old white female with1. Breast cancer, diagnosed in 1998 ER-PR-  2. NHL, low grade B cell lymphoma, diagnosed in 2004    TREATMENT SITE/ AREA OF INTEREST: T2-T4    COURSE #: 1  TECHNIQUE: 3D Conformal  BEAM ENERGY: 18 MV  DOSE PER FRACTION: 1.8   NUMBER OF FRACTIONS: 20  START DATE: 11/27/2007  END DATE: 12/22/2007  TOTAL SITE DOSE: 36 Gy    TREATMENT COURSE:   Mrs Vanes tolerated her tx well. She had a slight sore throat which was improved w/ miles mixture. She also had a slight decrease in energy. She did not notice any change in the numbness in her LUE. She did not tolerate steriods well, w/ visual changes, bloating, difficulty sleeping. She will be weaned over the next 1.5 weeks.   We will see her back in 6 weeks when she is in town for chemo. We will get an MRI at this time. She will call in 3 weeks to schedule.      CC:   Jetty Peeks, MD  [name], MD

## 2007-12-23 NOTE — Unmapped (Signed)
Signed by   LinkLog[employer] on 12/23/2007 at 19:09:23  Patient: Ashley Madden  Note: All result statuses are Final unless otherwise noted.    Tests: (1) DIAG-CHEST PA & LATERAL [phone])    Order NotePricilla Handler Order Number: [id number]    Non-EMR Ordering Provider: Sharrie Rothman     Order Note:     *** VERIFIED [facility]  Reason:  fever cough  Dict.Staff: Donia Pounds [phone]  Dict.Res: Henderson Cloud [id number]  Verified By: Donia Pounds     Ver: 12/23/07   7:09 pm  Exams:  DIAG-CHEST PA & LATERAL      PA and lateral chest dated 12/23/2007.    Indication: Cough and fever.    Comparison: CT chest dated 10/30/2007.    Findings: The cardiac silhouette is within normal limits.  Atherosclerotic calcification is noted in the aortic knob. There  is widening of the right paratracheal stripe, unchanged from the  prior CT. There is new bilateral upper lobe airspace opacity.  The patient is status post left-sided mastectomy.    Impression:    New bilateral upper lobe airspace opacity.  This is favored to  represent radiation pneumonitis. Pneumonia cannot be excluded.  Other etiologies such as drug toxicity are considered less  likely given the distribution. Sarcoidosis is also considered  less likely given the patient age.    **** end of result ****    Order Note:   EMR Routing to: Trudee Grip, MD - copy to - [phone]    Note: An exclamation mark (!) indicates a result that was not dispersed into   the flowsheet.  Document Creation Date: 12/23/2007 7:09 PM  _______________________________________________________________________    (1) Order result status: Final  Collection or observation date-time: 12/23/2007 14:49:32  Requested date-time: 12/23/2007 14:13:00  Receipt date-time:   Reported date-time: 12/23/2007 19:09:16  Referring Physician: Nelida Gores REFERRAL PT  Ordering Physician:  Non-EMR Physician [facility])  Specimen Source:   Source: QRS  Filler Order Number: [id number]  Lab site: [facility]

## 2007-12-23 NOTE — Unmapped (Signed)
THE [facility]     PATIENT NAME:   Ashley Madden, Ashley Madden                MR #:  [id number]   DATE OF BIRTH:  December 13, 1930                        ACCOUNT #:  [id number]   ED PHYSICIAN:   Salvatore Decent. Marylou Flesher, M.D.          ROOM #:  APOD   PRIMARY:        Marliss Coots, M.D.              NURSING UNIT:  [facility]   REFERRING:      Selected Referral Pt              FC:  M   DICTATED BY:    Prince Rome. Loleta Chance, M.D.             ADMIT DATE:  12/23/2007   VISIT DATE:     12/23/2007                        DISCHARGE DATE:                           EMERGENCY DEPARTMENT ADMISSION NOTE       CHIEF COMPLAINT:  Cough, fever.     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  The patient states that she woke up this morning with a   fever at 103.6.  She has, over the course of the past several weeks,   developed a cough that is productive of yellow sputum.  She has been taking   azithromycin at home, but has had continued worsened symptoms.  She states   that she does have some diarrhea, or frequent bowel movements, but this is   not changed from her baseline significantly.  She also has some nausea and   bloating sensation after meals, but again, this is not changed from baseline   for her.  She denies any dysuria or urinary frequency.  She denies any   symptoms of significant emesis.     PAST MEDICAL HISTORY:     1. Non-Hodgkin's lymphoma as stated above.   2. Hypothyroidism.   3. Hypertension.   4. Osteoporosis.     ALLERGIES:  None.     MEDICATIONS:     1. Atenolol.   2. Pravachol.   3. Synthroid.   4. Evista.   5. Aspirin.   6. Calcitrol.   7. Z-PAK.   8. Rituximab.     SOCIAL HISTORY:  She does not smoke.  She does drink wine with dinner daily.   She does not do any illicit drug.     REVIEW OF  SYSTEMS:  All other systems otherwise negative.     PHYSICAL EXAMINATION:     VITAL SIGNS:  Blood pressure is 132/81, pulse 108, respirations 20,   temperature is 97.3, O2 sat is 91% on room air.   GENERAL:  This is a well-developed, well-appearing 74 year old woman who   sitting in bed in no acute distress.   HEENT:  Pupils are  equal, round and reactive.  Sclerae are clear.  Oropharynx   is nonerythematous.   NECK:  Supple.  No lymphadenopathy.   CHEST:  Scattered inspiratory and expiratory crackles with decreased air   movement noticed in the right upper lung fields.   CARDIOVASCULAR:  Regular rate and rhythm, normal S1, S2, no murmurs, rubs,   gallops.   ABDOMEN:  Soft, nontender, nondistended, positive bowel sounds throughout.   SKIN:  Warm, dry, well-perfused.   NEUROLOGIC:  The patient is alert and oriented x4.  Speech is intact.  Gait   is intact.     LABORATORY DATA:  The patient had a CBC which shows a white count of 5.8, an   H&H 11.6 and 32.7, platelet count 147, neutrophils of 92.1, has an absolute   neutrophil count of 5342, has a renal panel which is largely within normal   limits except glucose 137,  sodium 134.     X-RAY:  The patient had a chest x-ray which was read as bilateral upper lobe   airspace opacities, favored to represent radiation pneumonitis, but pneumonia   cannot be excluded.     EMERGENCY DEPARTMENT COURSE/MEDICAL DECISION MAKING:  A 74 year old female   presenting to the emergency department with the above-stated complaint.  The   patient was triaged back to C pod, seen by myself and Dr. Marylou Flesher.  The   patient had a thorough history and physical examination performed.   IV   access was obtained.  The above-stated laboratory investigations were sent.   At this point in time, blood cultures are going to be obtained.  She will be   started on ceftriaxone and azithromycin for treatment of a community acquired   pneumonia.  She has not been hospitalized recently and given her febrile    symptoms even though her chest x-ray favors radiation pneumonitis type   picture, I do favor that this represents a pneumonia instead.  At this point   in time, I have the discussed the patient's clinical condition with the   Hematology/Oncology fellow on call, Dr. Carlisle Beers.  He is in agreement   with this plan.  The patient will be admitted to the hospital in stable   condition.     DIAGNOSIS:     1.  Community acquired pneumonia.                                                 _______________________________________   JMH/jf                                 _____   D:  12/23/2007 15:45                  Prince Rome. Loleta Chance, M.D.   T:  12/23/2007 16:23   Job #:  [id number]                                         _______________________________________  _____                                         Salvatore Decent Marylou Flesher, M.D.                             EMERGENCY DEPARTMENT ADMISSION NOTE                                                                PAGE    1 of   1                                                                PAGE    1 of   1

## 2007-12-27 NOTE — Unmapped (Signed)
Signed by   LinkLogic on 12/27/2007 at 15:40:52  Patient: Ashley Madden  Note: All result statuses are Final unless otherwise noted.    Tests: (1)  ([place])    Order Note: * [facility]*                       [address]                      [place], [place] [address]                          [phone]    Transthoracic Echocardiography    Patient:    Ashley Madden, Ashley Madden  MR #:       [id number]  Account:    [id number]  Study Date: 12/27/2007  Gender:     F  Age:        74  DOB:        06-08-1930  Room:       [address], Hemo   OPERATOR    [place] RDCS   REFERRING   Dammel, Lynelle Doctor, [place]   PERFORMING  Albertine Grates MD   ORDERING    Geanie Kenning, Valora Corporal   ADMITTING   [place], [place]   ATTENDING   Martyn Malay    ------------------------------------------------------------    Procedure:ECHO-2-D     Order:ECHO-2-D     Accession  ECHO COMPLETE (TTE)    ECHO COMPLETE      Number:[id number]                         (TTE)    ------------------------------------------------------------  Indications:  Shortness of Breath [786.05] pulmon HTN and  ejection fraction    ------------------------------------------------------------  PMH: Dyspnea. Risk factors: Hypothyroid, breast cancer s/p  left mastectomy, non Hodgkin's lymphoma s/p radiation and  chemotherapy Hypertension.    ------------------------------------------------------------  Study data:  Study status: Elective. Procedure:  Transthoracic echocardiography. Image quality was good.  Scanning was performed from the parasternal, apical, and  subcostal acoustic windows. Study completion: The patient  tolerated the procedure well and was discharged from the  lab.    Transthoracic echocardiography. M-mode, complete 2D,  complete spectral Doppler, and color Doppler. Height:  Height: 64in. Weight: Weight: 140lb. Body mass index: BMI:  24.1kg/m^2. Body surface area: BSA: 1.69m^2. Patient status:  Inpatient. Location: Echo  laboratory.    ------------------------------------------------------------  Study Conclusions    - Left ventricle: The cavity size was normal. Wall thickness    was normal. Systolic function was normal. The estimated    ejection fraction was in the range of 60% to 65%. Wall    motion was normal; there were no regional wall motion    abnormalities.  - Aortic valve: Trivial regurgitation.  - Mitral valve: Mildly calcified annulus. Mildly thickened    leaflets, .    ------------------------------------------------------------  Cardiac Anatomy  Left ventricle:    - The cavity size was normal. Wall thickness was normal.    Systolic function was normal. The estimated ejection    fraction was in the range of 60% to 65%. Wall motion was    normal; there were no regional wall motion abnormalities.  Aortic valve:    - Structurally normal valve. Cusp separation was normal.  Doppler:    - Transvalvular velocity  was within the normal range. There    was no stenosis. Trivial regurgitation.  - Peak velocity ratio of LVOT to aortic valve: 0.66.  - Mean gradient: 4mm Hg (S). Peak gradient: 7mm Hg (S).  Aorta:    - The aorta was normal.  Mitral valve:    - Mildly calcified annulus. Mildly thickened leaflets, .  Leaflet separation was normal.  Doppler:    - Transvalvular velocity was within the normal range. There    was no evidence for stenosis. No regurgitation.  Left atrium:    - The atrium was normal in size.  Right ventricle:    - The cavity size was normal. Wall thickness was normal.    Systolic function was normal.  Pulmonic valve:  Doppler:    - Trivial regurgitation.  Tricuspid valve:    - Structurally normal valve. Leaflet separation was normal.  Doppler:    - Transvalvular velocity was within the normal range. There    was no evidence for stenosis. Trivial regurgitation.  Right atrium:    - The atrium was normal in size.  Pericardium:    - There was no pericardial  effusion.    ------------------------------------------------------------    Basic Measurements                                  Normal  Left atrium  Area ES, A4C                            15.5 cm^2   8.8-23.4     Basic Measurements                                  Normal  Left ventricle  LV internal dimension, ED                 45 mm     37-56  LV internal dimension, ES                 27 mm     --------  Fractional shortening                     40 %      29-45  LV posterior wall, ED                      8 mm     6-11  Septal/posterior wall ratio, ED         0.88        --------  Relative wall thickness, ED             0.36        <0.45  Volume, ED, Teichholz                   92.4 ml     --------  Volume, ES, Teichholz                     27 ml     --------  Ejection fraction, Teichholz           70.78 %      64-83  Stroke volume, Teichholz                65.4 ml     --------  Volume index, ED, Teichholz               54 ml/m^2 --------  Volume index, ES, Teichholz               16 ml/m^2 --------  Stroke index, Teichholz                 38.4 ml/m^2 --------  Wall mass                              104.5 g      --------  Wall mass index                         61.3 g/m^2  --------  Mass/height                             0.64 g/cm   --------  Ventricular septum  Septal thickness, ED                       7 mm     --------  Septal thickness, ES                      12 mm     --------  Septal fractional thickening             *71 %      18-53  Aorta  Root diameter, ED                         29 mm     20-37  Left atrium  Anterior-posterior dimension, ES          28 mm     19-40  Anterior-posterior dimension index, ES  1.64 cm/m^2 <2.2  LA/aortic root ratio                    0.97        --------  Right ventricle  RV internal dimension, ED                 20 mm     --------     Doppler measurements                                Normal  Left ventricle  Ea, lateral annulus, tissue Doppler      7.7 cm/s    --------  E/Ea, lateral annulus, tissue Doppler   6.61        --------  Aa, lateral annulus, tissue Doppler     14.7 cm/s   --------  Ea/Aa, lateral annulus, tissue Doppler  0.52        --------  Sa, lateral annulus, tissue Doppler     10.8 cm/s   --------  LVOT  Peak velocity, S                          90 cm/s   --------  Aortic valve  Peak velocity, S                         136 cm/s   --------  Mean velocity, S  97.9 cm/s   --------  VTI, S                                  20.8 cm     --------  Mean gradient, S                           4 mm Hg  --------  Peak gradient, S                           7 mm Hg  --------  Peak velocity ratio, LVOT/AV            0.66        --------  Mitral valve  Peak E-wave velocity                    50.9 cm/s   --------  Peak A-wave velocity                    68.6 cm/s   --------  Deceleration time                       *130 ms     150-230  Peak E/A ratio                           0.7        --------  Tricuspid valve  Regurgitant peak velocity                228 cm/s   --------  Peak RV-RA gradient, S                    21 mm Hg  --------  Maximal regurgitant velocity             228 cm/s   --------  Pulmonic valve  Peak velocity, S                        53.2 cm/s   --------  Legend:  Mean values are shown as u=mean value.  Asterisk (*) marks values outside specified normal range.  Reviewed and confirmed by    Albertine Grates MD  2009-12-09T15:[id number]    Note: An exclamation mark (!) indicates a result that was not dispersed into   the flowsheet.  Document Creation Date: 12/27/2007 3:40 PM  _______________________________________________________________________    (1) Order result status: Final  Collection or observation date-time: 12/27/2007 14:13:17  Requested date-time:   Receipt date-time:   Reported date-time: 12/27/2007 15:40:48  Referring Physician: Marliss Coots  Ordering Physician:  Reviewed In Hospital [facility])  Specimen Source:   Source: DBS  Filler  Order Number: [id number]  Lab site:

## 2007-12-29 NOTE — Unmapped (Signed)
Signed by   LinkLogic on 12/29/2007 at 12:10:55  Patient: Ashley Madden  Note: All result statuses are Final unless otherwise noted.    Tests: (1) Bronchoscopy (Bronch)    Order Note: Indications:b/l upper lobe ground glass opacities; pt with   immunosupression     Medications: 1% lidocaine HCL 4 cc during the procedure Fentanyl 100   mcg IV during the procedure 1% lidocaine HCL 20 cc during the   procedure     Estimated Blood Loss:  Minimal cc.     Consent:  The benefits, risks, and alternatives to the procedure were   discussed, and informed consent was obtained from the patient.     Procedure:  The bronchoscope was passed with ease through the nares;   it was extended to the pharynx, larynx, trachea, right bronchial   tree, and left bronchial tree. The views were excellent. The   patient's toleration of the procedure was excellent. Transbronchial   biopsies were obtained from LUL ( apicoposterior segment). BAL was   then obtained: 150 cc total fluid instilled; approximately 50 cc   obtained in return. On the 3 subsequent alloquots there was a   decrease in the slightlly blood tinged color, therefore no pulmonary   hemorhage.     Findings: The vocal cord, a trachea, right and left bronchi were   examined. Tracheobronchial washings were obtained. The pharynx   appeared to be normal. The larynx appeared to be normal. The trachea   appeared to be normal. The right bronchial tree appeared to be   normal. The left bronchial tree appeared to be normal. No   endobronchial lesions; no hemorhage.     Unplanned Events:  There were no unplanned events.     Recommendations: Follow-up on the results of the biopsy specimen(s).   Follow up on the results of the cytology specimens. Follow-up on the   results of the cultures.     Summary: Normal appearance in the pharynx. Normal appearance in the   larynx. Normal appearance in the trachea. Normal appearance in the   right bronchial tree. Normal appearance in the left bronchial  tree.     Procedure Codes: 09323: Bronchoscopy with transbronchial biopsy   [phone]: Bronchoscopy with alveolar lavage     Performed By:  The procedure was performed by Nic Oprescu in the   presence of Philippa Chester, MD.     Fellows  Nic Oprescu  Version 1, electronically signed by Dr. Philippa Chester on 12/29/2007 at   12:10.    Note: An exclamation mark (!) indicates a result that was not dispersed into   the flowsheet.  Document Creation Date: 12/29/2007 12:10 PM  _______________________________________________________________________    (1) Order result status: Final  Collection or observation date-time: 12/29/2007 11:55  Requested date-time: 12/29/2007 11:00  Receipt date-time:   Reported date-time: 12/29/2007 12:11  Referring Physician: Marliss Coots  Ordering Physician:  Reviewed In Hospital [facility])  Specimen Source:   Source: DBS  Filler Order Number: [id number]  Lab site: 1

## 2008-01-04 NOTE — Unmapped (Signed)
Signed by   LinkLogic on 01/05/2008 at 00:59:50  Patient: Ashley Madden  Note: All result statuses are Final unless otherwise noted.    Tests: (1)  (MR)    Order Note:                                   [facility]     PATIENT NAME:   LEZLIE, RITCHEY                MR #:  [id number]  DATE OF BIRTH:  10/14/1930                        ACCOUNT #:  [id number]  ADMITTING:      Martyn Malay, M.D.         ROOM #:  [id number]  ATTENDING:      Theodis Aguas                      NURSING UNIT:  [id number]  SERVICE:                                          FC:  M  PRIMARY:        Marliss Coots, M.D.              ADMIT DATE:  12/23/2007  REFERRING:      Selected Referral Pt              DISCHARGE DATE:  01/04/2008  DICTATED BY:    Lillie Columbia, M.D.                                   DISCHARGE SUMMARY        ATTENDING PHYSICIAN:  Theodis Aguas, MD     DISCHARGE DIAGNOSES:     1. Non-Hodgkin's lymphoma.  2. History of breast cancer.  3. Pneumonia.  4. Hypothyroidism.  5. Hypertension.  6. Constipation.     PROCEDURES:     1. Chest x-ray on December 23, 2007, stable chest with diffuse airspace    opacities.  2. On December 25, 2007, chest x-ray showed a new bilateral upper lobe    airspace opacity.  The cardiomediastinal silhouette was stable, no definite    effusions or pneumothoraces are demonstrated, increased bilateral upper    zone predominant airspace disease of uncertain etiology, pneumonia versus    atypical edema.  The distribution and rapidity of onset are somewhat    atypical for radiation pneumonitis.  3. On December 27, 2007 there was an echo with Doppler.  The findings are left    ventricle, the cavity size was normal, wall thickness was normal, systolic    function was normal.  The estimated ejection fraction was in the range of    60-65%.  Wall motion was normal.  There were no regional wall motion    abnormalities.  Aortic valve, trivial regurgitation.  Mitral valve, mildly    calcified annulus, mildly  thickened leaflets.  4. On December 28, 2007, a  chest x-ray showed increased bilateral upper zone    predominant airspace disease consistent with pneumonia.  Also, consider    eosinophilic pneumonia, drug reaction, and cryptogenic  organizing pneumonia    as etiologies for the upper zone predominant parenchymal opacity.  5. On December 28, 2007, a CT chest without contrast showed findings of the    central airways are patent.  Previously visualized soft tissue density    within the trachea is no longer seen.  There has been interval development    of diffuse ground glass opacities in the bilateral upper zones.  There are    mild bronchiectasis associated with the ground glass opacities.  There is    relative sparing of the bilateral lung bases.  There are multiple    mediastinal lymph nodes, slightly increased in size since prior study.    There are findings consistent with left mastectomy.  Previous seen soft    tissue density in the left paravertebral region at T3 level extending into    the neural foraminal paravertebral region at T3 level extending into the    neural foraminal into the adjacent spinal canal has decreased in size,    measuring about 1.7 cm on today's study.  No bony erosion is demonstrated.    Bone windows demonstrate diffuse sclerosis of the C7 vertebral body,    unchanged.  Limited evaluation of the upper abdomen is normal.  Impression:  A. New areas of nonspecific diffuse ground glass opacities predominantly in    the are presents.  Diagnostic considerations include drug reaction,    atypical pneumonia, organizing pneumonia, pulmonary hemorrhage or acute    interstitial pneumonia.  Given the distribution, radiation pneumonitis is    unlikely.  B. Interval decrease in size of the left paraspinal soft tissue density at T3    extending into the neural foraminal spinal canal.  C. Interval mild increase in size and mediastinal lymph nodes which are not    enlarged by CT criteria.  D. Stable  sclerotic C7 vertebral body.  This is nonspecific, however,    metastatic disease cannot be excluded.  6. On December 29, 2007, there was an endoscopic bronchoscopy.  The findings    are the vocal cord, trachea, right and left bronchi were examined,    tracheobronchial washings were obtained.  Pharynx appear to be normal.  The    larynx appeared to be normal.  The trachea appeared to be normal.  The    right bronchial tree appeared to be normal.  The left bronchial tree    appeared to be normal and no endobronchial lesions, no hemorrhage.    Summary:  Normal appearance in the pharynx, normal appearance in the    larynx, normal appearance in the trachea, normal appearance in the right    bronchial tree, normal appearance in the left bronchial tree.  7. On December 29, 2007, a post transbronchial biopsy with the history of    biapical airspace disease is similar to the previous examination.  No    pneumothorax.  Impression:  There has been little change in the overall    appearance of the chest after transbronchial biopsy.  8. On January 01, 2008, a chest x-ray showed there are diffuse predominantly    upper and mid lung ill defined consolidative and ground glass opacities.    There is a focal opacity in the right upper lobe.  The lower lungs remain    grossly clear.  There is a more focal opacity in the right upper lobe.  The    cardiomediastinal silhouette appears stable.  There is no evidence of    pneumothorax.  Impression:  Diffuse ill defined consolidative and ground    glass opacities in both lungs.  These appear grossly unchanged.  These    findings can be seen in atypical pneumonia, drug toxicity or cryptogenic    organizing pneumonia.     CONSULTATIONS:     1. Pulmonary.  2. Infectious disease.  3. Respiratory.  4. Physical therapy.     ALLERGIES:  No known allergies.     DISCHARGE MEDICATIONS:     1. Atenolol 25 mg by mouth every day.  2. Synthroid 125 mcg po daily.  3. Evista 60 mg by mouth every day.  4.  Aspirin 81 mg by mouth every day.  5. Simvastatin 10 mg by mouth every qhs, however, the patient may substitute    their home prescription of Pravachol.  6. Calcitriol 0.25 mg by mouth every day.  7. Colace 100 mg by mouth daily prn constipation.     REASON FOR ADMISSION:  This is a 74 year old white female with history of  non-Hodgkin's lymphoma and paraspinal mass at T3-T4, it is a low-grade B cell  lymphoma diagnosed in 2004 and breast cancer, which was diagnosed in 1998.  It is ER negative, PR positive and status post left breast mastectomy.  Also  past medical history of hypothyroidism which is stable and hypertension which  is stable.  Presents with a fever up to 102 degrees Fahrenheit and a cough  productive with yellow sputum initially.  The hospital course was that the  patient was diagnosed with pneumonia versus rule out pneumonitis or the flu  and the patient was treated with Tamiflu and amantadine for the first four  days and was in isolation.  She had initially been started also on  azithromycin and IV ceftriaxone, but during the course of her stay, her fever  spiked and so the antibiotics were changed to vancomycin IV and cefepime of  which she received a seven day course and all the cultures were negative  including a urine Legionella negative, histo negative, fungal cultures  negative, and influenza A and B negative.  The patient improved over the  course of her hospitalization, however, at one point she had desaturated into  the 80s with activity and needed to be placed on 4 liters of oxygen, of which  she gradually weaned off on the day number 17 to room air with no oxygen  requirement.  Her oxygen sats were above 93 at that point and the patient was  seen by pulmonary and infectious disease and received a bronchoalveolar  lavage and bronchoscopy to aid in diagnosis, which showed no malignant cells  and HSV negative, and the CMV was negative.  So, the patient was seen to be  clinically improving  and was able to finish her course of seven days of IV  antibiotics and be discharged home on no oral outpatient antibiotics with no  oxygen requirement and no further cough or fever.  Also, her blood pressure  was well controlled during the course of her stay on her home medications,  which were atenolol and her constipation did require some oral lactulose as  she had no BM for three days and which resolved with the laxative and the  non-Hodgkin's lymphoma, was agreed to be the treatment for the chemotherapy  for it was to be rescheduled once she gets discharged from the hospital per  Dr. Samara Snide.     CONDITION ON DISCHARGE:  Stable.     DISCHARGE INSTRUCTIONS:  1. Discharge to home with her family.  2. Her diet is regular meals with supplementation of a protein shake with    every meal.  3. Her activity is as tolerated.  4. She is to contact Dr. Samara Snide for an appointment within 7-14 days.                                                    _______________________________________  JAK/im                                 _____  D:  01/04/2008 19:38                  Attending Physician, M.D.  T:  01/05/2008 00:53                  Dictated by:  Lillie Columbia, M.D.  Job #:  [id number]     c:   Elyse E. Lower, M.D.          Dr. Doralee Albino Rixe                                   DISCHARGE SUMMARY                                                               PAGE    1 of   1    Note: An exclamation mark (!) indicates a result that was not dispersed into   the flowsheet.  Document Creation Date: 01/05/2008 12:59 AM  _______________________________________________________________________    (1) Order result status: Final  Collection or observation date-time: 01/04/2008 00:00  Requested date-time:   Receipt date-time:   Reported date-time:   Referring Physician: Selected Pt  Ordering Physician:  Reviewed In Hospital [facility])  Specimen Source:   Source: DBS  Filler Order Number: [id number]  Lab site:

## 2008-01-05 NOTE — Unmapped (Signed)
THE [facility]     PATIENT NAME:   Ashley Madden, Ashley Madden                MR #:  [id number]   DATE OF BIRTH:  05/24/1930                        ACCOUNT #:  [id number]   ADMITTING:      Martyn Malay, M.D.         ROOM #:  [id number]   ATTENDING:      Theodis Aguas                      NURSING UNIT:  [id number]   SERVICE:                                          FC:  M   PRIMARY:        Marliss Coots, M.D.              ADMIT DATE:  12/23/2007   REFERRING:      Selected Referral Pt              DISCHARGE DATE:  01/04/2008   DICTATED BY:    Lillie Columbia, M.D.                                   DISCHARGE SUMMARY       ATTENDING PHYSICIAN:  Theodis Aguas, MD     DISCHARGE DIAGNOSES:     1. Non-Hodgkin's lymphoma.   2. History of breast cancer.   3. Pneumonia.   4. Hypothyroidism.   5. Hypertension.   6. Constipation.     PROCEDURES:     1. Chest x-ray on December 23, 2007, stable chest with diffuse airspace     opacities.   2. On December 25, 2007, chest x-ray showed a new bilateral upper lobe     airspace opacity.  The cardiomediastinal silhouette was stable, no definite     effusions or pneumothoraces are demonstrated, increased bilateral upper     zone predominant airspace disease of uncertain etiology, pneumonia versus     atypical edema.  The distribution and rapidity of onset are somewhat     atypical for radiation pneumonitis.   3. On December 27, 2007 there was an echo with Doppler.  The findings are left     ventricle, the cavity size was normal, wall thickness was normal, systolic     function was normal.  The estimated ejection fraction was in the range of     60-65%.  Wall motion was normal.  There were no regional wall motion     abnormalities.  Aortic valve, trivial regurgitation.  Mitral valve, mildly     calcified annulus, mildly thickened leaflets.   4. On December 28, 2007, a  chest x-ray showed increased bilateral upper zone     predominant airspace disease consistent with  pneumonia.  Also, consider     eosinophilic pneumonia, drug reaction, and cryptogenic organizing pneumonia     as etiologies for the upper zone predominant parenchymal opacity.   5. On December 28, 2007, a CT chest without contrast showed findings of the     central airways are patent.  Previously visualized  soft tissue density     within the trachea is no longer seen.  There has been interval development     of diffuse ground glass opacities in the bilateral upper zones.  There are     mild bronchiectasis associated with the ground glass opacities.  There is     relative sparing of the bilateral lung bases.  There are multiple     mediastinal lymph nodes, slightly increased in size since prior study.     There are findings consistent with left mastectomy.  Previous seen soft     tissue density in the left paravertebral region at T3 level extending into     the neural foraminal paravertebral region at T3 level extending into the     neural foraminal into the adjacent spinal canal has decreased in size,     measuring about 1.7 cm on today's study.  No bony erosion is demonstrated.     Bone windows demonstrate diffuse sclerosis of the C7 vertebral body,     unchanged.  Limited evaluation of the upper abdomen is normal.  Impression:   A. New areas of nonspecific diffuse ground glass opacities predominantly in     the are presents.  Diagnostic considerations include drug reaction,     atypical pneumonia, organizing pneumonia, pulmonary hemorrhage or acute     interstitial pneumonia.  Given the distribution, radiation pneumonitis is     unlikely.   B. Interval decrease in size of the left paraspinal soft tissue density at T3     extending into the neural foraminal spinal canal.   C. Interval mild increase in size and mediastinal lymph nodes which are not     enlarged by CT criteria.   D. Stable sclerotic C7 vertebral body.  This is nonspecific, however,     metastatic disease cannot be excluded.   6. On December 29, 2007, there was an endoscopic bronchoscopy.  The findings     are the vocal cord, trachea, right and left bronchi were examined,     tracheobronchial washings were obtained.  Pharynx appear to be normal.  The     larynx appeared to be normal.  The trachea appeared to be normal.  The     right bronchial tree appeared to be normal.  The left bronchial tree     appeared to be normal and no endobronchial lesions, no hemorrhage.     Summary:  Normal appearance in the pharynx, normal appearance in the     larynx, normal appearance in the trachea, normal appearance in the right     bronchial tree, normal appearance in the left bronchial tree.   7. On December 29, 2007, a post transbronchial biopsy with the history of     biapical airspace disease is similar to the previous examination.  No     pneumothorax.  Impression:  There has been little change in the overall     appearance of the chest after transbronchial biopsy.   8. On January 01, 2008, a chest x-ray showed there are diffuse predominantly     upper and mid lung ill defined consolidative and ground glass opacities.     There is a focal opacity in the right upper lobe.  The lower lungs remain     grossly clear.  There is a more focal opacity in the right upper lobe.  The     cardiomediastinal silhouette appears stable.  There is no evidence of     pneumothorax.  Impression:  Diffuse ill defined consolidative and ground     glass opacities in both lungs.  These appear grossly unchanged.  These     findings can be seen in atypical pneumonia, drug toxicity or cryptogenic     organizing pneumonia.     CONSULTATIONS:     1. Pulmonary.   2. Infectious disease.   3. Respiratory.   4. Physical therapy.     ALLERGIES:  No known allergies.     DISCHARGE MEDICATIONS:     1. Atenolol 25 mg by mouth every day.   2. Synthroid 125 mcg po daily.   3. Evista 60 mg by mouth every day.   4. Aspirin 81 mg by mouth every day.   5. Simvastatin 10 mg by mouth every qhs, however, the  patient may substitute     their home prescription of Pravachol.   6. Calcitriol 0.25 mg by mouth every day.   7. Colace 100 mg by mouth daily prn constipation.     REASON FOR ADMISSION:  This is a 74 year old white female with history of   non-Hodgkin's lymphoma and paraspinal mass at T3-T4, it is a low-grade B cell   lymphoma diagnosed in 2004 and breast cancer, which was diagnosed in 1998.   It is ER negative, PR positive and status post left breast mastectomy.  Also   past medical history of hypothyroidism which is stable and hypertension which   is stable.  Presents with a fever up to 102 degrees Fahrenheit and a cough   productive with yellow sputum initially.  The hospital course was that the   patient was diagnosed with pneumonia versus rule out pneumonitis or the flu   and the patient was treated with Tamiflu and amantadine for the first four   days and was in isolation.  She had initially been started also on   azithromycin and IV ceftriaxone, but during the course of her stay, her fever   spiked and so the antibiotics were changed to vancomycin IV and cefepime of   which she received a seven day course and all the cultures were negative   including a urine Legionella negative, histo negative, fungal cultures   negative, and influenza A and B negative.  The patient improved over the   course of her hospitalization, however, at one point she had desaturated into   the 80s with activity and needed to be placed on 4 liters of oxygen, of which   she gradually weaned off on the day number 17 to room air with no oxygen   requirement.  Her oxygen sats were above 93 at that point and the patient was   seen by pulmonary and infectious disease and received a bronchoalveolar   lavage and bronchoscopy to aid in diagnosis, which showed no malignant cells   and HSV negative, and the CMV was negative.  So, the patient was seen to be   clinically improving and was able to finish her course of seven days of IV    antibiotics and be discharged home on no oral outpatient antibiotics with no   oxygen requirement and no further cough or fever.  Also, her blood pressure   was well controlled during the course of her stay on her home medications,   which were atenolol and her constipation did require some oral lactulose as   she had no BM for three days and which resolved with the laxative and the   non-Hodgkin's lymphoma, was agreed  to be the treatment for the chemotherapy   for it was to be rescheduled once she gets discharged from the hospital per   Dr. Samara Snide.     CONDITION ON DISCHARGE:  Stable.     DISCHARGE INSTRUCTIONS:     1. Discharge to home with her family.   2. Her diet is regular meals with supplementation of a protein shake with     every meal.   3. Her activity is as tolerated.   4. She is to contact Dr. Samara Snide for an appointment within 7-14 days.                                                 _______________________________________   JAK/im                                 _____   D:  01/04/2008 19:38                  Attending Physician, M.D.   T:  01/05/2008 00:53                  Dictated by:  Lillie Columbia, M.D.   Job #:  [id number]     c:   Elyse E. Lower, M.D.          Dr. Doralee Albino Rixe                                   DISCHARGE SUMMARY                                                                PAGE    1 of   1   Job #:  [id number]     c:   Elyse E. Lower, M.D.          Dr. Doralee Albino Rixe                                   DISCHARGE SUMMARY                                                                PAGE    1 of   1

## 2008-01-15 NOTE — Unmapped (Signed)
Signed by Edman Circle on 01/15/2008 at 16:55:24    Clinical Lists Changes    Problems:  Added new problem of INTERSTITIAL LUNG DISEASE (ICD-515)  Orders:  Added new Test order of CT, Chest High Resolution w/o contrast (TDD-22025) - Signed

## 2008-01-17 ENCOUNTER — Inpatient Hospital Stay

## 2008-01-17 ENCOUNTER — Inpatient Hospital Stay: Attending: Vascular Surgery

## 2008-01-17 NOTE — Unmapped (Signed)
Signed by Kendrick Ranch MD on 01/17/2008 at 00:00:00  Dr. Garth Schlatter      Imported By: Maryellen Pile 01/23/2008 09:51:05    _____________________________________________________________________    External Attachment:    Please see [employer] EMR for this document.

## 2008-01-17 NOTE — Unmapped (Signed)
Signed by   [employer] on 01/17/2008 at 16:02:59  Patient: Ashley Madden  Note: All result statuses are Final unless otherwise noted.    Tests: (1) CT-CHEST W/O CONTRAST ([id number])    Order NotePricilla Handler Order Number: [id number]    Order Note:     *** VERIFIED ***  [facility]  Reason:  HRCT-ILD  Dict.Staff: Gwenyth Ober [phone]    Verified By: Gwenyth Ober      Ver: 01/17/08   4:03 pm  Exams:  CT-CHEST W/O CONTRAST      High-resolution CT of the chest, inspiratory and expiratory,  without intravenous contrast January 17, 2008 at 1522 the hours  compared with December 28, 2006.    Indication: ILD.    Findings: There are changes of left mastectomy, as seen on prior  exam.    The diffuse groundglass opacity seen on the prior exam has  evolved into denser areas of opacification with marked airway  dilatation. These findings are most profuse in the extreme lung  apices and the paramediastinal regions of the upper lungs. There  is associated architectural distortion.    Impression: Evolving bilateral parenchymal disease, now with an  appearance suggesting fibrosis. The distribution suggests  radiation as a possible cause. Recommend clinical correlation.  **** end of result ****    Order Note:   EMR Routing to: Kendrick Ranch, MD - ordering - [id number]    Note: An exclamation mark (!) indicates a result that was not dispersed into   the flowsheet.  Document Creation Date: 01/17/2008 4:02 PM  _______________________________________________________________________    (1) Order result status: Final  Collection or observation date-time: 01/17/2008 15:31:00  Requested date-time: 01/17/2008 06:51:00  Receipt date-time:   Reported date-time: 01/17/2008 16:03:04  Referring Physician: Dionisio Paschal LOWER  Ordering Physician: Aleen Sells [facility])  Specimen Source:   Source: QRS  Filler Order Number: [id number]  Lab site: [facility]

## 2008-01-17 NOTE — Unmapped (Signed)
Signed by Meryle Ready MD on 01/17/2008 at 00:00:00  DR. BAUGHMAN: CONSULT      Imported ByEather Colas Hummons 03/12/2008 14:05:21    _____________________________________________________________________    External Attachment:    Please see [employer] EMR for this document.

## 2008-01-17 NOTE — Unmapped (Signed)
Signed by Kendrick Ranch MD on 01/18/2008 at 11:10:04      History of Present Illness:     Patient with breast cancer and lypmhoma. Developed acute pneumontiis after radiation.  See letter.    Review of Systems     Respiratory   Cough:          - Better  Wheezing:          - Negative  Hemoptysis:          - Negative    Cardiovascular   Chest Discomfort:          - Better  Dyspnea on Exertion:        - Better  Edema:          - Currently has  History of Vascular Disease:          - Currently has    Gastrointestinal:          Gastrointestinal Comment:   no nausea    Musculoskeletal:     Arthritis:          - Currently has    Skin:     Rash:          - Negative    Neurological:     Seizures:          - Negative       Neurologic Comment:   numbness in left hand    Endocrine:     Diabetes:          - Negative    Constitutional:     Fatigue:          - Currently has  Weight Loss:          - Negative  Fever:          - Has had in the past      Results of Imaging Studies:   CT Scan Date: 01/18/08  CT scan results show: upper lobe fibrotic changes from radiation    Past History  Past Medical History (reviewed - no changes required):  Hyperlipidemia, Hypothyroidism, Cataracts, Eye Glasses, Shingles, Arthritis, Primary Breast cancer  Surgical History (reviewed - no changes required):  Appendectomy:*, Hysterectomy: *, Mastectomy: left, Knee Replacement: left    Social History (reviewed - no changes required): Marital Status: married,   Spouse: Norbert  Children: 7,   Employment Status: retired,   Patient Lives at: condo,   Exercise: lives in [place] half the year,   Tobacco Usage:non-smoker      VITAL SIGNS   Height: 64 inches,    Pulse rate: 80,    Respirations: 16,  Oximetry:      Patient has no oxygen requirements.    O2 Saturation:   96%      MEDICATIONS (on Intake):    SYNTHROID  TABS (LEVOTHYROXINE SODIUM TABS)   DEXAMETHASONE 4 MG TABS (DEXAMETHASONE) twice a day  ATENOLOL 50 MG TABS (ATENOLOL)   PREVACID  CPDR  (LANSOPRAZOLE CPDR)   EVISTA 60 MG TABS (RALOXIFENE HCL)   ADULT ASPIRIN EC LOW STRENGTH 81 MG TBEC (ASPIRIN)   CITRACAL PLUS  TABS (MULTIPLE MINERALS-VITAMINS)   * MILES MIXTURE Nystatin 2,000,000 U , Tetricycline 1 gram, Hydrocortisone 20 mg, Benadryl Elixir 1 tsp three times a day  ADVAIR DISKUS 250-50 MCG/DOSE MISC (FLUTICASONE-SALMETEROL) 1 bid        Physical Examination  Constitutional:  Within normal limits, well-developed.  Eyes (conjunctiva and lids):  Within normal limits.  Ears/Nose/Throat:  Nasal mucosa within normal limits, oral mucosa within normal limits.  Chest:  Inspection within normal limits, respiratory effort within normal limits, percussion of chest within normal limits, palpation of chest within normal limits, auscultation within normal limits.  Heart:  Heart exam within normal limits.  Muscle Strength/Tone:  Muscle strength/tone within normal limits.  Extremities:  Extremities within normal limits.  Skin:  Skin within normal limits.  Psychiatric:  Oriented to person-place-time.      ASSESSMENT & PLAN   Patient with past breast cacner and lymphoma. Had lymphoma around T4, got radiation. Developed diffuse infiltrates.  Bronch negative for infection. Foudn atypia.  CT evolved to upper lobe fibrosis.   Looks liuek radiation.  See letter.    Patient Instructions: advair one puff a day    chest xray on return    DISPOSITION Return to clinic for Follow-up  Appointment Date:   02/14/2008  Appt Time:   3 PM    MEDICATIONS   ADVAIR DISKUS 250-50 MCG/DOSE MISC (FLUTICASONE-SALMETEROL) 1 bid  #2 x 3, 01/17/2008, Kendrick Ranch MD    TODAY'S ORDERS   X-ray, Chest, single view, frontal [CPT-71010]      MEDICAL DECISION MAKING     * independent review of data-eg image specimen    * obtain old records or history from another person    * permanent chart problem/surgery list reviewed    * permanent chart chronic med/allergy list reviewed    * permanent chart social/family history reviewed    * review/order  clinical lab tests    * review/order radiology tests    RISKS & BENEFITS     * Risks, benefits and treatment options discussed with patient.

## 2008-01-20 NOTE — Unmapped (Signed)
THE [facility]     PATIENT NAME:   Ashley Madden, Ashley Madden                MR #:  [id number]   DATE OF BIRTH:  March 27, 1930                        ACCOUNT #:  [id number]   VISIT DATE:     01/17/2008                        ROOM #:   PRIMARY:        Marliss Coots, M.D.              NURSING UNIT:   REFERRING:      Elyse E. Lower, M.D.              Elaina Hoops:  Judie Petit   DICTATED BY:    Balinda Quails                       ADMIT DATE:  01/17/2008   READ BY:        Amy B. Renato Gails, M.D.                 DISCHARGE DATE:                                   VASCULAR LAB REPORT       PROCEDURE(S) PERFORMED:  Lower extremity venous duplex.     INDICATIONS:  This 75 year old white female presents with edema in the   bilateral lower extremities.     TECHNICAL OVERVIEW:  Lower extremity venous imaging was performed using real   time B-mode in the transverse plane with and without compression.   Visualization included the common femoral vein including the saphenofemoral   junction, femoral vein, popliteal vein, calf veins and the great saphenous   vein in the thigh.  Color and spectral Doppler were used to document flow   characteristics from the common femoral vein, femoral vein and popliteal vein   to evaluate for the presence or absence of spontaneous flow and respiratory   phasic variation.  Augmentation maneuvers were performed as needed to   document venous flow response and valve competence.     FINDINGS:  Evidence of acute occluding DVT noted in the right posterior   tibial vein and peroneal vein and subacute chronic occluding DVT noted in the   left mid femoral vein.  Otherwise there is no evidence for DVT/SVT noted in   the bilateral lower extremities.  Critical results called to Dr. Samara Snide on   01/17/08 at 11:05, read back and verified.     IMPRESSION:     1. Evidence of acute occluding DVT noted in the right posterior tibial vein     and peroneal vein and subacute chronic occluding DVT noted in the left mid   femoral vein.   2. Otherwise there is no evidence for DVT/SVT noted in the bilateral lower     extremities.   3. Critical results called to Dr. Samara Snide on 01/17/08 at 11:05, read back and     verified.  _______________________________________   MM/tmg                                 _____   D:  01/17/2008 15:31                  Amy B. Renato Gails, M.D.   T:  01/20/2008 08:51                  Dictated by:  Balinda Quails   Job #:  [id number]                                       VASCULAR LAB REPORT                                                                PAGE    1 of   1                                                                PAGE    1 of   1

## 2008-02-21 ENCOUNTER — Inpatient Hospital Stay

## 2008-02-21 NOTE — Unmapped (Signed)
Signed by Kendrick Ranch MD on 02/28/2008 at 13:46:48      History of Present Illness:     Patient's cough better, but not totally resolved. She is brining up some white with yellow phlegm      Review of Systems     Respiratory   Cough:          - Better  Wheezing:          - Negative  Hemoptysis:          - Negative    Cardiovascular   Chest Discomfort:          - Currently has  Dyspnea on Exertion:        - Better    Gastrointestinal:          Gastrointestinal Comment:   no nausea    Skin:     Rash:          - Worse       Skin Comment:   since returning to [place]    Constitutional:     Fatigue:          - Negative  Weight Loss:          - Negative  Fever:          - Negative      Results of Imaging Studies:   X-Ray Date: 02/21/08  Chest x-ray results show: marked improvement of infiltrates    Past History  Past Medical History (reviewed - no changes required):  Hyperlipidemia, Hypothyroidism, Cataracts, Eye Glasses, Shingles, Arthritis, Primary Breast cancer  Surgical History (reviewed - no changes required):  Appendectomy:*, Hysterectomy: *, Mastectomy: left, Knee Replacement: left    Social History (reviewed - no changes required): Marital Status: married,   Spouse: Norbert  Children: 7,   Employment Status: retired,   Patient Lives at: condo,   Exercise: lives in [place] half the year,   Tobacco Usage:non-smoker      VITAL SIGNS   Height: 64 inches,    Pulse rate: 80,    Respirations: 16,  Oximetry:      Patient has no oxygen requirements.    O2 Saturation:   98%      MEDICATIONS (on Intake):    SYNTHROID  TABS (LEVOTHYROXINE SODIUM TABS)   ATENOLOL 50 MG TABS (ATENOLOL)   PREVACID  CPDR (LANSOPRAZOLE CPDR)   EVISTA 60 MG TABS (RALOXIFENE HCL)   ADULT ASPIRIN EC LOW STRENGTH 81 MG TBEC (ASPIRIN)   CITRACAL PLUS  TABS (MULTIPLE MINERALS-VITAMINS)   * MILES MIXTURE Nystatin 2,000,000 U , Tetricycline 1 gram, Hydrocortisone 20 mg, Benadryl Elixir 1 tsp three times a day  ADVAIR DISKUS 500-50 MCG/DOSE MISC  (FLUTICASONE-SALMETEROL) 1 daily to twice a day        Physical Examination  Constitutional:  Within normal limits, well-developed.  Eyes (conjunctiva and lids):  Within normal limits.  Ears/Nose/Throat:  Nasal mucosa within normal limits, oral mucosa within normal limits.  Chest:  Inspection within normal limits, respiratory effort within normal limits, percussion of chest within normal limits, palpation of chest within normal limits, auscultation within normal limits.  Heart:  Heart exam within normal limits.  Muscle Strength/Tone:  Muscle strength/tone within normal limits.  Extremities:  Extremities within normal limits.  Skin:  Skin within normal limits.  Psychiatric:  Oriented to person-place-time.    Smoking Status: non-smoker   Medications reviewed, updated and verified with patient or patient representative.      ASSESSMENT &  PLAN   Patient's cough much better. However, she still has some phlegm. It sounds mildly purulent.    Her chest xray shows complete resolution of infiltrates in lung fields. Still has some upper lobe scarring.    Will add amoxicillin for bronchiectasis.  Will check CT on return.    Patient Instructions: CT scan at 1:30 PM    DISPOSITION Return to clinic for Follow-up  Appointment Date:   05/22/2008  Appt Time:   2 PM    MEDICATIONS   ADVAIR DISKUS 500-50 MCG/DOSE MISC (FLUTICASONE-SALMETEROL) 1 daily to twice a day  #3 x 3, 02/21/2008, Kendrick Ranch MD  ADVAIR DISKUS 500-50 MCG/DOSE MISC (FLUTICASONE-SALMETEROL) 1 daily to twice a day  #1 x 6, 02/21/2008, Kendrick Ranch MD  AMOXICILLIN 500 MG CAPS (AMOXICILLIN) 1 twice a day  #60 x 6, 02/21/2008, Kendrick Ranch MD  Stopped or Removed  at this visit:  DEXAMETHASONE 4 MG TABS (DEXAMETHASONE) twice a day    TODAY'S ORDERS   CT, Chest, w/o contrast [CPT-71250]      MEDICAL DECISION MAKING     * independent review of data-eg image specimen    * permanent chart problem/surgery list reviewed    * permanent chart chronic med/allergy  list reviewed    * permanent chart social/family history reviewed    * review/order radiology tests    * review/order other diagnostic or tx interventions    RISKS & BENEFITS     * Risks, benefits and treatment options discussed with patient.

## 2008-02-21 NOTE — Unmapped (Signed)
Signed by   LinkLogic on 02/21/2008 at 13:47:35  Patient: Ashley Madden  Note: All result statuses are Final unless otherwise noted.    Tests: (1) DIAG-CHEST PA OR AP [phone])    Order Note: RadNet Accession Number: [id number]    Order Note:                               [facility]     Patient: Ashley Madden, Ashley Madden   DOB:     [birth year]   MRN:     [id number]   FIN:       [id number]   Accn#:   [id number]                                  Diagnostic Radiology     Exam                       Exam Date/Time       Ordering Physician   DIAG-CHEST PA OR AP        02/21/2008 13:34 EST Kendrick Ranch     Reason for Exam   ild     Report   PA chest dated 21 February 2008 at 13: 33 hours.     Comparison: 01 January 2008.     Impression:     1. In the interim, there has been substantial improvement of the previously   described bilateral and biapical groundglass opacities. There are residual   opacities at the apices   while the parahilar opacities have essentially cleared.   2. Again noted is left mastectomy, calcified mediastinal and left hilar   lymph nodes, aortic atherosclerosis and lumbar scoliosis.   ********** VERIFIED REPORT **********     Dictated: 02/21/2008 1:43 pm      CANTOR M.D., Gerrit Friends   Signed (Electronic Signature):  CANTOR M.D., Gerrit Friends           02/21/08   1:48     Technologist: SAB    Order Note:   EMR Routing to: Kendrick Ranch - ordering - [id number]    Note: An exclamation mark (!) indicates a result that was not dispersed into   the flowsheet.  Document Creation Date: 02/21/2008 1:47 PM  _______________________________________________________________________    (1) Order result status: Final  Collection or observation date-time: 02/21/2008 13:34:48  Requested date-time: 02/21/2008 13:30:00  Receipt date-time:   Reported date-time: 02/21/2008 13:48:00  Referring Physician: Aleen Sells  Ordering Physician: Aleen Sells [facility])  Specimen Source: RAD&Rad Type  Source: QRS  Filler  Order Number: [id number]  Lab site: [facility]

## 2008-02-28 NOTE — Unmapped (Signed)
Signed by Kendrick Ranch MD on 02/28/2008 at 13:47:18          [facility]  [facility]  Aleen Sells MD and [place] MD  [phone]            Fax [phone]        Re:  Ashley Madden  SSN:  [id number]  DOB:  Nov 17, 1930  MRN:  [id number]      February 28, 2008              Dear Jetty Peeks, MD  Dr. Samara Snide  Dr. Dia Sitter      I recently saw your patient in the office.  Enclosed are the most recent laboratory tests and my comments.      Results of Imaging Studies:   X-Ray Date: 02/21/08  Chest x-ray results show: marked improvement of infiltrates        Patient's cough much better. However, she still has some phlegm. It sounds mildly purulent.    Her chest xray shows complete resolution of infiltrates in lung fields. Still has some upper lobe scarring.    Will add amoxicillin for bronchiectasis.  Will check CT on return.      Sincerely,    Kendrick Ranch MD  Professor of Medicine    Note:  If lab test results are missing, they were either not available or not done.  Further records may also be available in records at [address] [facility] [phone]).    Signed electronically      Copies To:        ]

## 2008-05-08 ENCOUNTER — Inpatient Hospital Stay

## 2008-05-22 ENCOUNTER — Inpatient Hospital Stay

## 2008-05-22 NOTE — Unmapped (Signed)
Signed by   LinkLogic on 05/22/2008 at 16:23:44    Appointment status changed to no show by  LinkLogic on 05/22/2008 4:23 PM.    No Show Comments  ----------------  (AM5) FOLLOW UP    Appointment Information  -----------------------  Appt Type:         Date:  Wednesday, May 22, 2008       Time:  2:00 PM for 15 min    Urgency:  Routine    Made By:  Kayren Eaves   To Visit:  Aleen Sells MD     Reason:  (AM5) FOLLOW UP    Appt Comments  -------------  -- 05/22/08 16:23: Gerrit Heck) NO SHOW --  (AM5) FOLLOW UP  CHEST CT ON 05/22/08 @ 1:30    -- 02/28/08 10:27: (COPELADJ) BOOKED --  Routine  at 05/22/2008 2:00 PM for 15 min  (AM5) FOLLOW UP  CHEST CT ON 05/22/08 @ 1:30

## 2009-07-30 ENCOUNTER — Inpatient Hospital Stay

## 2009-07-30 NOTE — Unmapped (Signed)
Signed by   LinkLogic on 07/30/2009 at 10:45:45  Patient: Ashley Madden  Note: All result statuses are Final unless otherwise noted.    Tests: (1) CT-MAXILLOFACIAL W/O CONTRAST ([id number])    Order Note: RadNet Accession Number: [id number]    Order Note:                                [facility]     Patient: [name], [name]   DOB:     [birth year]   MRN:     [id number]   FIN:       [id number]   Accn#:   [id number]                                   Computed Tomography     Exam                       Exam Date/Time       Ordering Physician   CT-MAXILLOFACIAL W/O       07/30/2009 10:15 EDT MCCLELLAN, MICHAEL M   CONTRAST     Reason for Exam   CHRONIC SINUSITIS     Report   Paranasal sinus CT without contrast July 30, 2009 at 10: 16     Indications: Chronic sinusitis     Comparison: None     Findings:     Axial images are obtained through the paranasal sinuses with coronal   reconstruction views.     Metallic posts from multiple maxillary dental implants are identified. At   least 2 maxillary dental implant posts on the right extending into the   right maxillary sinus cavity. One dental implant post on the left extends   into the left maxillary sinus cavity. There is extensive mucosal thickening   in both maxillary sinuses, right slightly greater than left. Right sphenoid   sella  mucosal thickening is noted. There is bilateral anterior, middle and   posterior ethmoid air cell mucosal thickening as well as bilateral inferior   frontal sinus mucosal thickening. There is no definite osseous thickening   present. There is mucosal thickening involving the osteomeatal complexes.   There is a Keros type III configuration of the floor the anterior cranial   fossa.      Bilateral TMJ arthropathy is noted which appears relatively severe with   marked flattening of the mandibular condyles and subchondral cystic change.     Impression:   1. Pansinus opacification as detailed above.   2. Bilateral dental implants with  implant posts extending into the   maxillary sinuses bilaterally.   3. Severe bilateral TMJ arthropathy.   ********** VERIFIED REPORT **********     Dictated: 07/30/2009 10:44 am     CORNELIUS M.D., [facility] S   Signed (Electronic Signature):  CORNELIUS M.D., [facility] S       07/30/09   10:47     Technologist: Minda Meo    Note: An exclamation mark (!) indicates a result that was not dispersed into   the flowsheet.  Document Creation Date: 07/30/2009 10:45 AM  _______________________________________________________________________    (1) Order result status: Final  Collection or observation date-time: 07/30/2009 10:15:54  Requested date-time: 07/30/2009 07:00:00  Receipt date-time:   Reported date-time: 07/30/2009 10:47:35  Referring Physician: Amador Cunas  Ordering Physician:  Non-EMR Physician [facility])  Specimen Source: RAD&Rad Type  Source: QRS  Filler Order Number: [id number]  Lab site: [facility]

## 2010-08-05 NOTE — Unmapped (Signed)
Name: Ashley Madden   DOB:  April 04, 1930   MRN:  [id number]    Indications:b/l upper lobe ground glass opacities; pt with  immunosupression    Medications: 1% lidocaine HCL 4 cc during the procedure Fentanyl 100  mcg IV during the procedure 1% lidocaine HCL 20 cc during the  procedure    Estimated Blood Loss:  Minimal cc.    Consent:  The benefits, risks, and alternatives to the procedure were  discussed, and informed consent was obtained from the patient.    Procedure:  The bronchoscope was passed with ease through the nares;  it was extended to the pharynx, larynx, trachea, right bronchial  tree, and left bronchial tree. The views were excellent. The  patient's toleration of the procedure was excellent. Transbronchial  biopsies were obtained from LUL ( apicoposterior segment). BAL was  then obtained: 150 cc total fluid instilled; approximately 50 cc  obtained in return. On the 3 subsequent alloquots there was a  decrease in the slightlly blood tinged color, therefore no pulmonary  hemorhage.    Findings: The vocal cord, a trachea, right and left bronchi were  examined. Tracheobronchial washings were obtained. The pharynx  appeared to be normal. The larynx appeared to be normal. The trachea  appeared to be normal. The right bronchial tree appeared to be  normal. The left bronchial tree appeared to be normal. No  endobronchial lesions; no hemorhage.    Unplanned Events:  There were no unplanned events.    Recommendations: Follow-up on the results of the biopsy specimen(s).  Follow up on the results of the cytology specimens. Follow-up on the  results of the cultures.    Summary: Normal appearance in the pharynx. Normal appearance in the  larynx. Normal appearance in the trachea. Normal appearance in the  right bronchial tree. Normal appearance in the left bronchial tree.    Procedure Codes: 54098: Bronchoscopy with transbronchial biopsy  [phone]: Bronchoscopy with alveolar lavage    Performed By:  The procedure was  performed by Nic Oprescu in the  presence of Philippa Chester, MD.    Fellows  Nic Oprescu  Version 1, electronically signed by Dr. Philippa Chester on 12/29/2007 at  12:10.

## 2011-02-25 MED ORDER — atenolol (TENORMIN) 50 MG tablet
50 | ORAL_TABLET | Freq: Every day | ORAL | 0.00 refills | 30.00000 days | Status: AC
Start: 2011-02-25 — End: 2012-11-03

## 2011-02-25 NOTE — Unmapped (Addendum)
Patient is out of refills and would like refill today.  Please call [facility] in [place]---R. Dyer// BB: [facility] records show we have regularly filled this Rx for patient and that it is a 50 mg tablet

## 2011-04-23 NOTE — Unmapped (Signed)
I spoke to [place] at Dr. Rema Jasmine office in medical records. The phone number is [phone]. She is going to fax her records and chemotherapy records to [place] at [phone]-------[facility]

## 2011-04-23 NOTE — Unmapped (Signed)
Patient has been in [place]. She has not been see Dr. Samara Snide  the [facility]. Her last appointment with Dr. Samara Snide was in October ( per Pt) Wanted to consult with you before appt. Was made. She also stated she is due for treatment on April 15. She will be returning to [place] [employer] before then

## 2011-04-23 NOTE — Unmapped (Signed)
I emailed Jasmaine and [facility] to schedule patient for that day, around 1230 or so and to confirm w/patient that she is still using Dr Phillips Hay with [facility] for her [facility] treatment.    BECKY - would you please contact [place] [facility] for CHEMO dosing records and all office visits and imaging, etc to have by end of next week (so we have time to review treatment dosing for EPIC input)?  PH: [phone]. Thanks! BB, RN//

## 2011-05-04 NOTE — Unmapped (Signed)
Message copied by Yarden Manuelito Kathie Rhodes on Tue May 04, 2011 10:21 AM  ------       Message from: Marcelo Baldy       Created: Tue May 04, 2011 10:06 AM       Regarding: RE: chemo infusion       Contact: [phone]         That diagnosis worked!!! She's cleared to start her chemo here.              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       She has been receiving this, approved, at the [place] oncologist during the winter and also at [facility] prior to transferring here.       Let me know if you need anything else and could you send me a message when approved?              Thanks!       Steward Drone       ----- Message -----          From: Marcelo Baldy          Sent: 05/04/2011   8:21 AM            To: Norton Blizzard, RN       Subject: chemo infusion                                             Good Morning,              Steward Drone,       Mrs.Lonzo has medicare and she'll require a signed abn for IVIG Infusion, it's not a covered service based on the diag I used which was 202.80. That was listed on her visit with Dr.Lower on 04/19.              Thanks,       Nordstrom       [phone]

## 2011-05-04 NOTE — Unmapped (Signed)
Message copied by Fallon Howerter S on Tue May 04, 2011 11:01 AM  ------       Message from: Valentino Saxon, [place] L       Created: Tue May 04, 2011 11:00 AM       Regarding: RE: chemo infusion       Contact: [phone]         Yes, her Rituxan is a go as well!!              Tiffani       ----- Message -----          From: Norton Blizzard, RN          Sent: 05/04/2011  10:10 AM            To: Tiffani L Harrell       Subject: RE: chemo infusion                                         YAY!!! Thanks!       Is her Rituxan a go as well, I assume?              :)       ----- Message -----          From: Marcelo Baldy          Sent: 05/04/2011  10:06 AM            To: Norton Blizzard, RN       Subject: RE: chemo infusion                                         That diagnosis worked!!! She's cleared to start her chemo here.              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       She has been receiving this, approved, at the [place] oncologist during the winter and also at [facility] prior to transferring here.       Let me know if you need anything else and could you send me a message when approved?              Thanks!       Steward Drone       ----- Message -----          From: Marcelo Baldy          Sent: 05/04/2011   8:21 AM            To: Norton Blizzard, RN       Subject: chemo infusion  Good Morning,              Steward Drone,       Mrs.Dorn has medicare and she'll require a signed abn for IVIG Infusion, it's not a covered service based on the diag I used which was 202.80. That was listed on her visit with Dr.Lower on 04/19.              Thanks,       Nordstrom       [phone]

## 2011-05-06 ENCOUNTER — Encounter

## 2011-05-06 NOTE — Unmapped (Signed)
[facility]  AUTHORITY FOR TREATMENT / CONSENT      Patient Name:  Ashley Madden   DOB: 11/26/1930    MRN: [id number]      I authorize  __________Dr [name], MD____________________________________ (lead practitioner) and the associates or assistants of his/her choice to treat the following condition(s): __________________Non-Hodgkins Lymphoma and Immunoglobulin Deficiency_______________________________________________________________________________________________________________________________    My physician/practitioner _____Dr [name], MD_________________________ has explained that the chemotherapy/biological therapy necessary to treat my condition will involve:     ________Ritxuan (lymphoma treatment) and IVIG (immunglobulin deficiency)________________________________________________________________________________________________________________________________________________    My physician has explained the risks and benefits associated with this treatment and the potential problems that might occur during treatment.  I understand that chemotherapy/biological therapy can have short-term and long-term side effects.  Each drug used has its own side effect risks. Depending on the drugs used, one or more of these side effects may occur. These side effects may include, but are not limited to:    ?? Feeling sick to my stomach (I may vomit or ???throw up???)  ?? Sores in the mouth  ?? Loose stools (diarrhea)  ?? Problems moving my bowels (constipation)  ?? Kidney damage (may be permanent)  ?? Heart damage (may be permanent)  ?? Nerve damage (may be permanent)  ?? Hearing loss  ?? Allergic reaction (fast heart rate, low blood pressure, trouble breathing, swelling of face)  ?? Fever with or without presence of infection  ?? Low white blood cells (these fight infection) - this could put me a risk for infection  ?? Low red blood cells (these cells carry oxygen) - this could make me feel tired or short of  breath  ?? Low platelets (these cells help blood clot) - this could put me at risk for bleeding   ?? Reduced chance of becoming pregnant or fathering children (however, I will not rely on chemotherapy as a form of birth control)  ?? Hair loss (usually not permanent)  ?? If the drug leaks out from the infusion site, I may have:  o Local redness, swelling, and pain  o Permanent skin or nerve damage at the site where the drug is infused       I understand that I could have side effects from my chemotherapy/biological therapy that are not listed on this form.  Each patient can respond differently to chemotherapy, and could have side effects that have not been reported by others.  I understand complications from chemotherapy/biological therapy could cause my death. My physician has explained the risks and benefits associated with this treatment potential and problems that might occur during treatment.  Additional risks include: ____________________________________________ _________________________________________________________________________  _________________________________________________________________________  (optional description of additional risks) or ______supplemental page attached    My physician has discussed appropriate alternatives and their associated benefits and risks. This includes the possible results from not receiving the recommended care, treatment, and services. I understand that I may stop treatment at any time.    The goal of my treatment is:   ____ Cure   ____ Disease control   ____ Symptom control    ____ Other: ______________________________________      The likelihood of achieving the goals of this treatment is:  ____Poor   ____Fair   ____Good   ____Unknown due to:  ______________________________________________________    I understand that physicians and other practitioners in addition to the lead practitioner may be involved in my treatment, including Resident physicians and other  trainees.  They may perform such tasks only within  their scope of practice and license, and as set forth in the privileges granted by the hospital.  Residents may participate under the oversight of the Attending physician, depending on their level of education and skills, and the patient???s condition. The names of these individuals will be identified in the medical record.     The [facility] is dedicated to advancing medical knowledge to improve care for its patients.  I understand that the treatment outlined below is necessary to support this mission and I consent to it.  However, I may cross out the treatment if I do not consent and it will not affect my care.    I consent to the photographing or televising of the treatment being given, including appropriate portions of my/the patient???s body, for medical, scientific or educational purposes as long as my/the patient???s identity is not disclosed. I understand that: 1) if I am conscious during the treatment, I can ask that the recording stop and 2) I can rescind (take back) my consent for use of this media up to a reasonable time before the images are used.     Additional comments:  ________________________________________________________________________________________________________________________________________________________    All information concerning this treatment and necessary for my informed consent, including alternative or no treatment has been disclosed to me.  Also, all my questions about the procedure, including the expected involvement of other practitioners and trainees, have been answered to my satisfaction.      I understand that the practice of medicine is not an exact science and I acknowledge that no guarantees have been made to me about the results of this treatment.      I explained the risks, benefits and alternatives of this procedure, including the above, with the patient, or the patient???s representative (Physician or other  individual practitioner)      ___________________________________________________________________________  (Physician Signature)                                         (Printed Name)                                                       (Date and Time)      I give my permission and consent to the treatment or procedure specified above:    ___________________________________________________________________________  (Patient Signature)                                         (Printed Name)                                                       (Date and Time)        Patient is unable to consent, I therefore consent for this patient.  ___________________________________________________________________________  (Signature of Surrogate Decision-maker)      (Printed Name/Relationship)                                        (  Date and Time)        ____________________________________________________________________________  (Signature of Witness if consent by telephone or otherwise not obtained at the time of the initial explanation)                                                      (Printed Name)                                      (Date and Time)         _____Check if telephone consent        _____Check if interpreter involved ________________________________________ (Name)

## 2011-05-07 ENCOUNTER — Ambulatory Visit: Admit: 2011-05-07 | Discharge: 2011-05-07 | Payer: MEDICARE | Attending: Hematology & Oncology

## 2011-05-07 ENCOUNTER — Inpatient Hospital Stay: Admit: 2011-05-07 | Attending: Hematology & Oncology

## 2011-05-07 ENCOUNTER — Ambulatory Visit: Admit: 2011-05-07 | Discharge: 2011-05-07 | Payer: MEDICARE

## 2011-05-07 DIAGNOSIS — C8589 Other specified types of non-Hodgkin lymphoma, extranodal and solid organ sites: Secondary | ICD-10-CM

## 2011-05-07 DIAGNOSIS — C50919 Malignant neoplasm of unspecified site of unspecified female breast: Secondary | ICD-10-CM

## 2011-05-07 LAB — BASIC METABOLIC PANEL
1/Creatinine: 1.19 ratio
Anion Gap: 10 mmol/L (ref 3–16)
BUN: 21 mg/dL (ref 7–25)
CO2: 33 mmol/L (ref 21–33)
Calcium: 9.4 mg/dL (ref 8.6–10.2)
Chloride: 102 mmol/L (ref 98–110)
Creatinine: 0.84 mg/dL (ref 0.50–1.20)
GFR MDRD Af Amer: 79 See note.
GFR MDRD Non Af Amer: 65 See note.
Glucose: 47 mg/dL (ref 65–99)
Osmolality, Calculated: 300 mOsm/kg (ref 278–305)
Potassium: 4.6 mmol/L (ref 3.5–5.3)
Sodium: 145 mmol/L (ref 135–146)

## 2011-05-07 LAB — KAPPA/LAMBDA, FREE LIGHT CHAINS, SERUM
Kappa/Lambda Ratio: 1 (ref 0.26–1.65)
Kappa: 8.91 mg/L (ref 3.30–19.40)
Lambda: 8.87 mg/L (ref 5.71–26.30)

## 2011-05-07 LAB — CBC
Hematocrit: 40.9 % (ref 35.0–45.0)
Hemoglobin: 13.9 g/dL (ref 11.7–15.5)
MCH: 29.3 pg (ref 27.0–33.0)
MCHC: 33.9 g/dL (ref 32.0–36.0)
MCV: 86.3 fL (ref 80.0–100.0)
MPV: 10.2 fL (ref 7.5–11.5)
Platelets: 150 10E3/uL (ref 140–400)
RBC: 4.74 10E6/uL (ref 3.80–5.10)
RDW: 14 % (ref 11.0–15.0)
WBC: 4.7 10E3/uL (ref 3.8–10.8)

## 2011-05-07 LAB — HEPATIC FUNCTION PANEL
ALT: 21 U/L (ref 6–40)
AST: 23 U/L (ref 14–36)
Albumin: 4.2 g/dL (ref 3.6–5.1)
Alkaline Phosphatase: 82 U/L (ref 33–130)
Bilirubin, Direct: 0 mg/dL (ref 0.0–0.2)
Total Bilirubin: 0.8 mg/dL (ref 0.2–1.2)
Total Protein: 6.9 g/dL (ref 6.2–8.3)

## 2011-05-07 LAB — IGA: IgA: 62 mg/dL (ref 91–414)

## 2011-05-07 LAB — IGM: IgM: 78 mg/dL (ref 40–230)

## 2011-05-07 LAB — IGG: IgG: 725 mg/dL (ref 700–1600)

## 2011-05-07 MED ORDER — sodium chloride 0.9 % flush 10 mL
Freq: Every day | INTRAMUSCULAR | Status: AC | PRN
Start: 2011-05-07 — End: 2011-05-07

## 2011-05-07 MED ORDER — diphenhydrAMINE (BENADRYL) injection 25 mg
50 | Freq: Every day | INTRAMUSCULAR | Status: AC | PRN
Start: 2011-05-07 — End: 2011-05-07

## 2011-05-07 MED ORDER — dexamethasone (DECADRON) tablet 10 mg
4 | Freq: Two times a day (BID) | ORAL | Status: AC
Start: 2011-05-07 — End: 2011-05-07
  Administered 2011-05-07: 16:00:00 via ORAL

## 2011-05-07 MED ORDER — albuterol (PROVENTIL HFA;VENTOLIN HFA) inhaler 1-2 puff
90 | RESPIRATORY_TRACT | Status: AC | PRN
Start: 2011-05-07 — End: 2011-05-07

## 2011-05-07 MED ORDER — riTUXimab (RITUXAN) 640 mg in sodium chloride 0.9 % 640 mL infusion
10 | Freq: Once | INTRAVENOUS | Status: AC
Start: 2011-05-07 — End: 2011-05-07
  Administered 2011-05-07: 17:00:00 via INTRAVENOUS

## 2011-05-07 MED ORDER — acetaminophen (TYLENOL) tablet 650 mg
325 | Freq: Once | ORAL | Status: AC
Start: 2011-05-07 — End: 2011-05-07
  Administered 2011-05-07: 16:00:00 via ORAL

## 2011-05-07 MED ORDER — diphenhydrAMINE (BENADRYL) capsule 50 mg
50 | Freq: Once | ORAL | Status: AC
Start: 2011-05-07 — End: 2011-05-07

## 2011-05-07 MED ORDER — EPINEPHrine (ADRENALIN) injection 0.3 mg
1 | Freq: Every day | INTRAMUSCULAR | Status: AC | PRN
Start: 2011-05-07 — End: 2011-05-07

## 2011-05-07 MED ORDER — 0.9%  NaCl infusion
Freq: Every day | INTRAVENOUS | Status: AC | PRN
Start: 2011-05-07 — End: 2011-05-07
  Administered 2011-05-07: 17:00:00 via INTRAVENOUS

## 2011-05-07 MED ORDER — 0.9%  NaCl infusion
Freq: Every day | INTRAVENOUS | Status: AC | PRN
Start: 2011-05-07 — End: 2011-05-07

## 2011-05-07 MED ORDER — acetaminophenTYLENOLtablet650mg
325 | Freq: Once | ORAL | Status: AC
Start: 2011-05-07 — End: 2011-05-07

## 2011-05-07 MED ORDER — heparin lock flush 100 unit/mL syringe Syrg 500 Units
100 | Freq: Every day | INTRAVENOUS | Status: AC | PRN
Start: 2011-05-07 — End: 2011-05-07

## 2011-05-07 MED ORDER — diphenhydrAMINE (BENADRYL) capsule 25 mg
25 | Freq: Once | ORAL | Status: AC
Start: 2011-05-07 — End: 2011-05-07
  Administered 2011-05-07: 16:00:00 via ORAL

## 2011-05-07 MED ORDER — hydrocortisone sod succ (PF)(SOLU-CORTEF) 100 mg injection
100 | Freq: Every day | INTRAMUSCULAR | Status: AC | PRN
Start: 2011-05-07 — End: 2011-05-07

## 2011-05-07 MED FILL — TYLENOL 325 MG TABLET: 325 325 mg | ORAL | Qty: 2

## 2011-05-07 MED FILL — RITUXAN 10 MG/ML CONCENTRATE,INTRAVENOUS: 10 10 mg/mL | INTRAVENOUS | Qty: 64

## 2011-05-07 MED FILL — DIPHENHYDRAMINE 25 MG CAPSULE: 25 25 mg | ORAL | Qty: 1

## 2011-05-07 NOTE — Unmapped (Signed)
See dictated note

## 2011-05-07 NOTE — Unmapped (Signed)
Chemotherapy consent signed and in IVT dept.  Pt here for Rituxan and IVIG for lymphoma and decreased immune system.  Was being tx'd in [place] for the winter but has returned to [place] again.  See clinic note for v/s, med update and pain assessment.  Pt states that she gets Rituxan over 3 hours d/t side effects after tx.  She will not be able to get IVIG today d/t time limitation today.  She will return Monday for IVIG.  See doc flow sheet for vs's.  Pt tolerated treatment well.  PIV discontinued, bandaide applied.  Left ambulatory.

## 2011-05-10 ENCOUNTER — Ambulatory Visit: Admit: 2011-05-10 | Discharge: 2011-05-10 | Payer: MEDICARE

## 2011-05-10 DIAGNOSIS — C8589 Other specified types of non-Hodgkin lymphoma, extranodal and solid organ sites: Secondary | ICD-10-CM

## 2011-05-10 MED ORDER — immuneglobulinhumanIgGGAMMAGARDSoln25g
10 | Freq: Once | INTRAVENOUS | Status: AC
Start: 2011-05-10 — End: 2011-05-10
  Administered 2011-05-10: 13:00:00 via INTRAVENOUS

## 2011-05-10 MED ORDER — sodium chloride 0.9 % flush 10 mL
Freq: Every day | INTRAMUSCULAR | Status: AC | PRN
Start: 2011-05-10 — End: 2011-05-10

## 2011-05-10 MED ORDER — acetaminophen (TYLENOL) tablet 650 mg
325 | Freq: Once | ORAL | Status: AC
Start: 2011-05-10 — End: 2011-05-10
  Administered 2011-05-10: 13:00:00 via ORAL

## 2011-05-10 MED ORDER — diphenhydrAMINE (BENADRYL) injection 25 mg
50 | Freq: Every day | INTRAMUSCULAR | Status: AC | PRN
Start: 2011-05-10 — End: 2011-05-10

## 2011-05-10 MED ORDER — diphenhydrAMINE (BENADRYL) capsule 25 mg
25 | Freq: Once | ORAL | Status: AC
Start: 2011-05-10 — End: 2011-05-10
  Administered 2011-05-10: 13:00:00 via ORAL

## 2011-05-10 MED ORDER — hydrocortisone sod succ (PF)(SOLU-CORTEF) 100 mg injection
100 | Freq: Every day | INTRAMUSCULAR | Status: AC | PRN
Start: 2011-05-10 — End: 2011-05-10

## 2011-05-10 MED ORDER — EPINEPHrine (ADRENALIN) injection 0.3 mg
1 | Freq: Every day | INTRAMUSCULAR | Status: AC | PRN
Start: 2011-05-10 — End: 2011-05-10

## 2011-05-10 MED ORDER — 0.9%  NaCl infusion
Freq: Every day | INTRAVENOUS | Status: AC | PRN
Start: 2011-05-10 — End: 2011-05-10
  Administered 2011-05-10: 13:00:00 via INTRAVENOUS

## 2011-05-10 MED ORDER — heparin lock flush 100 unit/mL syringe Syrg 500 Units
100 | Freq: Every day | INTRAVENOUS | Status: AC | PRN
Start: 2011-05-10 — End: 2011-05-10

## 2011-05-10 MED ORDER — albuterol (PROVENTIL HFA;VENTOLIN HFA) inhaler 1-2 puff
90 | RESPIRATORY_TRACT | Status: AC | PRN
Start: 2011-05-10 — End: 2011-05-10

## 2011-05-10 MED FILL — DIPHENHYDRAMINE 25 MG CAPSULE: 25 25 mg | ORAL | Qty: 1

## 2011-05-10 MED FILL — TYLENOL 325 MG TABLET: 325 325 mg | ORAL | Qty: 2

## 2011-05-10 MED FILL — GAMMAGARD LIQUID 10 % INJECTION SOLUTION: 10 10 % | INTRAMUSCULAR | Qty: 250

## 2011-05-10 NOTE — Unmapped (Signed)
Message copied by Maude Leriche on Mon May 10, 2011  9:14 AM  ------       Message from: BURNS, BRENDA S       Created: Fri May 07, 2011  5:28 PM       Regarding: FW: appt time                       ----- Message -----          From: Lois Huxley, RN          Sent: 05/07/2011   4:34 PM            To: Larence Penning, RN, Norton Blizzard, RN       Subject: appt time                                                  Steward Drone       This pt's next appt with Dr. Samara Snide is at 10:30 am on 6/14 I believe.  In order for her to get both the Rituxan and the IVIG on the same day, she needs to see Dr. Samara Snide earlier so we can get her started by 10 am.  Can you get her appt moved up earlier?  The pt said that she was going to call Karen Kitchens to get an earlier appt also.         Thank you       Chales Abrahams

## 2011-05-10 NOTE — Unmapped (Signed)
Here for IVIG. No recent problems with rituxan or IVIG. Has been getting infusions in [place]. Likes both infusions given on same day. Message left with Bobbi concerning earlier appt needed for next infusion day. No reaction. PIV flushed and dc'd. DC ambulatory.

## 2011-05-11 NOTE — Unmapped (Addendum)
Subjective:   DICTATED ADDENDUM.  PROVIDER TO REVIEW AND SIGN     Patient ID: Ashley Madden is a 78 y.o. female.    DIAGNOSIS:  Breast cancer, non-Hodgkin's lymphoma, acquired immunoglobulin deficiency.    HPI  Ashley Madden is here for follow up.  She is accompanied today by her husband.  She has been in [place] most of the winter where she has been receiving IVIG and Rituxan every 6 weeks.  For the most part she has done very well with this.  I did discuss with her that I think at some point we should re-evaluate and consider discontinuing this and just monitoring.  She is quite hesitant about it but I have told her that by the end of the year I would like to ideally have her off Rituxan.  She is not having any major issues with it.  No other GI, GU, pulmonary or other major toxicities.  She still requires Advair on a regular basis for her underlying lung issues.  She will need a mammogram this summer as well.  She is otherwise up-to-date with other testing.    Review of Systems  Remainder of a 10-point review of systems is unchanged.       Objective:    Physical Exam  VITAL SIGNS:  Per flow sheet.  HEENT:  She has a small, about 4 mm erythematous, slightly desquamative lesion over her nose, which I suspect may be an early skin cancer.  Throat is clear.  CHEST:  Mastectomy site negative.  LUNGS:  Clear.   ABDOMEN:  Soft, nontender.    EXTREMITIES:  There is no lower extremity edema.  NEUROLOGIC: Otherwise grossly intact.    Laboratory tests all per flow sheet.         Assessment:       Clinically pretty stable.       Plan:       1.  The patient will be re-evaluated this summer.  2.  She will continue IVIG and Rituxan.  3.  Questions of hers and her husband's have otherwise been addressed.  4.  She will return as noted.      EEL/dla  [id number]

## 2011-06-18 ENCOUNTER — Encounter: Payer: MEDICARE | Attending: Hematology & Oncology

## 2011-07-02 ENCOUNTER — Ambulatory Visit: Admit: 2011-07-02 | Discharge: 2011-07-02 | Payer: MEDICARE | Attending: Hematology & Oncology

## 2011-07-02 ENCOUNTER — Ambulatory Visit: Admit: 2011-07-02 | Discharge: 2011-07-02 | Payer: MEDICARE

## 2011-07-02 ENCOUNTER — Inpatient Hospital Stay: Admit: 2011-07-02 | Payer: MEDICARE | Attending: Hematology & Oncology

## 2011-07-02 DIAGNOSIS — C8589 Other specified types of non-Hodgkin lymphoma, extranodal and solid organ sites: Secondary | ICD-10-CM

## 2011-07-02 DIAGNOSIS — C50919 Malignant neoplasm of unspecified site of unspecified female breast: Secondary | ICD-10-CM

## 2011-07-02 DIAGNOSIS — C8590 Non-Hodgkin lymphoma, unspecified, unspecified site: Secondary | ICD-10-CM

## 2011-07-02 LAB — CBC
Hematocrit: 41.1 % (ref 35.0–45.0)
Hemoglobin: 13.9 g/dL (ref 11.7–15.5)
MCH: 29.8 pg (ref 27.0–33.0)
MCHC: 33.8 g/dL (ref 32.0–36.0)
MCV: 88.3 fL (ref 80.0–100.0)
MPV: 9.8 fL (ref 7.5–11.5)
Platelets: 130 10*3/uL (ref 140–400)
RBC: 4.65 10*6/uL (ref 3.80–5.10)
RDW: 14 % (ref 11.0–15.0)
WBC: 4.5 10*3/uL (ref 3.8–10.8)

## 2011-07-02 LAB — COMPREHENSIVE METABOLIC PANEL
1/Creatinine: 1.43 ratio
ALT: 22 U/L (ref 6–40)
AST: 24 U/L (ref 16–46)
Albumin: 4.1 g/dL (ref 3.6–5.1)
Alkaline Phosphatase: 77 U/L (ref 33–130)
Anion Gap: 12 mmol/L (ref 3–16)
BUN: 17 mg/dL (ref 7–25)
CO2: 30 mmol/L (ref 21–33)
Calcium: 9.2 mg/dL (ref 8.6–10.2)
Chloride: 101 mmol/L (ref 98–110)
Creatinine: 0.7 mg/dL (ref 0.50–1.20)
GFR MDRD Af Amer: 98 See note.
GFR MDRD Non Af Amer: 81 See note.
Glucose: 105 mg/dL (ref 65–99)
Osmolality, Calculated: 298 mOsm/kg (ref 278–305)
Potassium: 4.4 mmol/L (ref 3.5–5.3)
Sodium: 143 mmol/L (ref 135–146)
Total Bilirubin: 0.8 mg/dL (ref 0.2–1.2)
Total Protein: 7.1 g/dL (ref 6.2–8.3)

## 2011-07-02 LAB — DIFFERENTIAL
Basophils Absolute: 14 /uL (ref 0–200)
Basophils Relative: 0.3 % (ref 0.0–1.0)
Eosinophils Absolute: 36 /uL (ref 15–500)
Eosinophils Relative: 0.8 % (ref 0.0–8.0)
Lymphocytes Absolute: 608 /uL (ref 850–3900)
Lymphocytes Relative: 13.5 % (ref 15.0–45.0)
Monocytes Absolute: 441 /uL (ref 200–950)
Monocytes Relative: 9.8 % (ref 0.0–12.0)
Neutrophils Absolute: 3402 /uL (ref 1500–7800)
Neutrophils Relative: 75.6 % (ref 40.0–80.0)

## 2011-07-02 LAB — KAPPA/LAMBDA, FREE LIGHT CHAINS, SERUM
Kappa/Lambda Ratio: 0.44 ratio (ref 0.26–1.65)
Kappa: 3.2 mg/L (ref 3.3–19.4)
Lambda: 7.2 mg/L (ref 5.7–26.3)

## 2011-07-02 LAB — IGG: IgG: 746 mg/dL (ref 751.0–1560.0)

## 2011-07-02 LAB — IGA: IgA: 59.7 mg/dL (ref 82.0–453.0)

## 2011-07-02 LAB — IGM: IgM: 76 mg/dL (ref 46.0–304.0)

## 2011-07-02 MED ORDER — hydrocortisone sod succ (PF)(SOLU-CORTEF) 100 mg injection
100 | Freq: Every day | INTRAMUSCULAR | Status: AC | PRN
Start: 2011-07-02 — End: 2011-07-02

## 2011-07-02 MED ORDER — 0.9%  NaCl infusion
Freq: Every day | INTRAVENOUS | Status: AC | PRN
Start: 2011-07-02 — End: 2011-07-02
  Administered 2011-07-02: 15:00:00 via INTRAVENOUS

## 2011-07-02 MED ORDER — diphenhydrAMINE (BENADRYL) capsule 50 mg
50 | Freq: Once | ORAL | Status: AC
Start: 2011-07-02 — End: 2011-07-02
  Administered 2011-07-02: 15:00:00 via ORAL

## 2011-07-02 MED ORDER — dexamethasone (DECADRON) tablet 10 mg
4 | Freq: Two times a day (BID) | ORAL | Status: AC
Start: 2011-07-02 — End: 2011-07-02
  Administered 2011-07-02: 15:00:00 via ORAL

## 2011-07-02 MED ORDER — heparin lock flush 100 unit/mL syringe Syrg 500 Units
100 | Freq: Every day | INTRAVENOUS | Status: AC | PRN
Start: 2011-07-02 — End: 2011-07-02

## 2011-07-02 MED ORDER — diphenhydrAMINE (BENADRYL) injection 25 mg
50 | Freq: Every day | INTRAMUSCULAR | Status: AC | PRN
Start: 2011-07-02 — End: 2011-07-02

## 2011-07-02 MED ORDER — acetaminophen (TYLENOL) tablet 650 mg
325 | Freq: Once | ORAL | Status: AC
Start: 2011-07-02 — End: 2011-07-02
  Administered 2011-07-02: 15:00:00 via ORAL

## 2011-07-02 MED ORDER — EPINEPHrine (ADRENALIN) injection 0.3 mg
1 | Freq: Every day | INTRAMUSCULAR | Status: AC | PRN
Start: 2011-07-02 — End: 2011-07-02

## 2011-07-02 MED ORDER — albuterol (PROVENTIL HFA;VENTOLIN HFA) inhaler 1-2 puff
90 | RESPIRATORY_TRACT | Status: AC | PRN
Start: 2011-07-02 — End: 2011-07-02

## 2011-07-02 MED ORDER — riTUXimab (RITUXAN) 640 mg in sodium chloride 0.9 % 320 mL infusion
Freq: Once | INTRAVENOUS | Status: AC
Start: 2011-07-02 — End: 2011-07-02
  Administered 2011-07-02: 16:00:00 via INTRAVENOUS

## 2011-07-02 MED ORDER — sodium chloride 0.9 % flush 10 mL
Freq: Every day | INTRAMUSCULAR | Status: AC | PRN
Start: 2011-07-02 — End: 2011-07-02

## 2011-07-02 MED FILL — DIPHENHYDRAMINE 50 MG CAPSULE: 50 50 MG | ORAL | Qty: 1

## 2011-07-02 MED FILL — DEXAMETHASONE 4 MG TABLET: 4 4 MG | ORAL | Qty: 3

## 2011-07-02 MED FILL — RITUXAN 10 MG/ML CONCENTRATE,INTRAVENOUS: 10 10 mg/mL | INTRAVENOUS | Qty: 64

## 2011-07-02 MED FILL — TYLENOL 325 MG TABLET: 325 325 mg | ORAL | Qty: 2

## 2011-07-02 NOTE — Unmapped (Signed)
See dictated note

## 2011-07-02 NOTE — Unmapped (Signed)
Here for chemo after Dr Lower appt. Meds, allergies assessed in clinic. Labs WNL. Pt prefers slow about 3 hr infusion as she tolerates it better. Did well with infusion. PIV flushed and dc'd. DC ambulatory. Pt states not receiving IVIG until those labs reviewed by Dr Lower.

## 2011-07-06 NOTE — Unmapped (Signed)
DICTATED ADDENDUM.  PROVIDER TO REVIEW AND SIGN    Diagnosis:  Breast cancer, non-Hodgkin's lymphoma.    Miss Ashley Madden is here for follow up.  Overall she seems to be doing pretty well.  She and her husband recently returned from a river trip, which they thoroughly enjoyed.  She has had no increased coughing, shortness of breath.  She did not have any change in her diarrhea, which is pretty stable right now.  She is due for scans.  She is going to contact Dr. Patrcia Dolly office to see if mammo is set up.  We did discuss restaging her and perhaps discontinuation of treatment.  She feels that she does much better from a respiratory viewpoint as long as she stays on the IVIG.  I have told  her that we will recheck her immunoglobulin levels today to see if she needs this.      Review of Systems:    Remainder of a 10-point review of systems is unchanged.    Physical Examination:  Vital signs per flow sheet.  Throat is clear. Lungs are clear.  Cardiac exam is negative.  Abdomen is soft, nontender.  There is no lower extremity edema.  Skin is otherwise negative.  Neuro grossly intact.  Labs all per flow sheet.      Impression:  Clinically stable.    Plan:  1.  Patient will continue with current Rituxan.    2.  She will be set up for additional scans in the fall of this year.  It sounds like she would like [place] to do mammo, bone density, as well as CAT scans.  3.  Quantitative immunoglobulins will be checked.  4.  She is to return as noted.    EL/bk  [id number]

## 2011-07-08 NOTE — Unmapped (Addendum)
Patient LM on scheduler's line VM, which was transferred to me via [place] in VM.   She would like results of recent test to find out if she will need IVIG next visit.  Per Dr Chinita Greenland tasking note results, patient will need IVIG at next visit or to be scheduled for such.    6-21 - no answer at preferred c/b #. LM to c/b to both Becky's and RN line - her choice.    Patient did c/b and asked for results.  I called back shortly after; no answer again. LM this time that IgG level is low and to expect treatment of IVIG at next app't

## 2011-07-09 NOTE — Unmapped (Addendum)
Patient called back again after I'd LM for her that her IVIG does need to be given and at her next app't.  Patient wanted to clarify this and thought she was supposed to come for an early app't just to receive her IVIG, prior to next scheduled appt in July.    Dr Lower didn't indicate this in the memo sent to me, so I will ask her tonight and c/b patient on Monday. She is ok w/this plan.    Dr Lower had to re-clarify answer Monday night. She does agree to patient coming ahead of schedule to get her IVIG separately from her Rituxan infusion late July.  Notified schedulers to call patient with this information, etc.

## 2011-07-19 ENCOUNTER — Ambulatory Visit: Admit: 2011-07-19 | Discharge: 2011-07-19 | Payer: MEDICARE | Attending: Hematology & Oncology

## 2011-07-19 ENCOUNTER — Inpatient Hospital Stay: Admit: 2011-07-19 | Payer: MEDICARE | Attending: Hematology & Oncology

## 2011-07-19 ENCOUNTER — Ambulatory Visit: Admit: 2011-07-19 | Discharge: 2011-07-19 | Payer: MEDICARE

## 2011-07-19 DIAGNOSIS — C8590 Non-Hodgkin lymphoma, unspecified, unspecified site: Secondary | ICD-10-CM

## 2011-07-19 DIAGNOSIS — C50919 Malignant neoplasm of unspecified site of unspecified female breast: Secondary | ICD-10-CM

## 2011-07-19 DIAGNOSIS — D809 Immunodeficiency with predominantly antibody defects, unspecified: Secondary | ICD-10-CM

## 2011-07-19 LAB — COMPREHENSIVE METABOLIC PANEL
1/Creatinine: 1.61 ratio
ALT: 23 U/L (ref 6–40)
AST: 22 U/L (ref 16–46)
Albumin: 3.9 g/dL (ref 3.6–5.1)
Alkaline Phosphatase: 87 U/L (ref 33–130)
Anion Gap: 12 mmol/L (ref 3–16)
BUN: 20 mg/dL (ref 7–25)
CO2: 30 mmol/L (ref 21–33)
Calcium: 8.7 mg/dL (ref 8.6–10.2)
Chloride: 101 mmol/L (ref 98–110)
Creatinine: 0.62 mg/dL (ref 0.50–1.20)
GFR MDRD Af Amer: 112 See note.
GFR MDRD Non Af Amer: 93 See note.
Glucose: 94 mg/dL (ref 65–99)
Osmolality, Calculated: 298 mOsm/kg (ref 278–305)
Potassium: 3.9 mmol/L (ref 3.5–5.3)
Sodium: 143 mmol/L (ref 135–146)
Total Bilirubin: 0.6 mg/dL (ref 0.2–1.2)
Total Protein: 7.1 g/dL (ref 6.2–8.3)

## 2011-07-19 LAB — DIFFERENTIAL
Basophils Absolute: 5 /uL (ref 0–200)
Basophils Relative: 0.1 % (ref 0.0–1.0)
Eosinophils Absolute: 51 /uL (ref 15–500)
Eosinophils Relative: 1 % (ref 0.0–8.0)
Lymphocytes Absolute: 638 /uL (ref 850–3900)
Lymphocytes Relative: 12.5 % (ref 15.0–45.0)
Monocytes Absolute: 530 /uL (ref 200–950)
Monocytes Relative: 10.4 % (ref 0.0–12.0)
Neutrophils Absolute: 3876 /uL (ref 1500–7800)
Neutrophils Relative: 76 % (ref 40.0–80.0)

## 2011-07-19 LAB — CBC
Hematocrit: 41.5 % (ref 35.0–45.0)
Hemoglobin: 13.5 g/dL (ref 11.7–15.5)
MCH: 28.9 pg (ref 27.0–33.0)
MCHC: 32.7 g/dL (ref 32.0–36.0)
MCV: 88.6 fL (ref 80.0–100.0)
MPV: 9.4 fL (ref 7.5–11.5)
Platelets: 129 10*3/uL (ref 140–400)
RBC: 4.68 10*6/uL (ref 3.80–5.10)
RDW: 13.6 % (ref 11.0–15.0)
WBC: 5.1 10*3/uL (ref 3.8–10.8)

## 2011-07-19 MED ORDER — immune globulin (human) (IgG) (GAMMAGARD) Soln 25 g
10 | Freq: Once | INTRAVENOUS | Status: AC
Start: 2011-07-19 — End: 2011-07-19
  Administered 2011-07-19: 18:00:00 via INTRAVENOUS

## 2011-07-19 MED ORDER — hydrocortisone sod succ (PF) (SOLU-CORTEF) 100 mg/2 mL injection SolR 100 mg
100 | Freq: Every day | INTRAMUSCULAR | Status: AC | PRN
Start: 2011-07-19 — End: 2011-07-19

## 2011-07-19 MED ORDER — sodium chloride 0.9 % flush 10 mL
Freq: Every day | INTRAMUSCULAR | Status: AC | PRN
Start: 2011-07-19 — End: 2011-07-19

## 2011-07-19 MED ORDER — EPINEPHrine (ADRENALIN) injection 0.3 mg
1 | Freq: Every day | INTRAMUSCULAR | Status: AC | PRN
Start: 2011-07-19 — End: 2011-07-19

## 2011-07-19 MED ORDER — 0.9%  NaCl infusion
Freq: Every day | INTRAVENOUS | Status: AC | PRN
Start: 2011-07-19 — End: 2011-07-19
  Administered 2011-07-19: 18:00:00 via INTRAVENOUS

## 2011-07-19 MED ORDER — heparin lock flush 100 unit/mL syringe Syrg 500 Units
100 | Freq: Every day | INTRAVENOUS | Status: AC | PRN
Start: 2011-07-19 — End: 2011-07-19

## 2011-07-19 MED ORDER — diphenhydrAMINE (BENADRYL) capsule 25 mg
25 | Freq: Once | ORAL | Status: AC
Start: 2011-07-19 — End: 2011-07-19
  Administered 2011-07-19: 18:00:00 via ORAL

## 2011-07-19 MED ORDER — acetaminophen (TYLENOL) tablet 650 mg
325 | Freq: Once | ORAL | Status: AC
Start: 2011-07-19 — End: 2011-07-19
  Administered 2011-07-19: 18:00:00 via ORAL

## 2011-07-19 MED ORDER — albuterol (PROVENTIL HFA;VENTOLIN HFA) inhaler 1-2 puff
90 | RESPIRATORY_TRACT | Status: AC | PRN
Start: 2011-07-19 — End: 2011-07-19

## 2011-07-19 MED ORDER — diphenhydrAMINE (BENADRYL) injection 25 mg
50 | Freq: Every day | INTRAMUSCULAR | Status: AC | PRN
Start: 2011-07-19 — End: 2011-07-19

## 2011-07-19 MED FILL — DIPHENHYDRAMINE 25 MG CAPSULE: 25 25 mg | ORAL | Qty: 1

## 2011-07-19 MED FILL — TYLENOL 325 MG TABLET: 325 325 mg | ORAL | Qty: 2

## 2011-07-19 MED FILL — GAMMAGARD LIQUID 10 % INJECTION SOLUTION: 10 10 % | INTRAMUSCULAR | Qty: 250

## 2011-07-19 NOTE — Unmapped (Signed)
See dictated note

## 2011-07-19 NOTE — Unmapped (Addendum)
Pt here for IVIG infusion. #24 PIV started in R hand. Pt given pre-meds of tylenol and benadryl. Pt inquired about getting IVIG and rituxan on same day in the future. RN discussed with clinical manager and advised pt to discuss this with Dr. Chinita Greenland nurse or CSC at next visit, as pt will need to see Dr. Samara Snide and arrive for her infusion appt in the morning. Pt verbalized understanding. Pt tolerated infusion well. Vitals monitored throughout. PIV flushed with NS and d/c'd at completion of infusion. Pt left area ambulatory with appt for next treatment.

## 2011-07-21 NOTE — Unmapped (Signed)
DICTATED ADDENDUM.  PROVIDER TO REVIEW AND SIGN    DIAGNOSIS(ES):  History of breast cancer, non-Hodgkin's lymphoma on Rituxan maintenance, history immunoglobulin deficiency.      Ashley Madden is here with her husband to continue with IVIG.  Her last quantitative immunoglobulins were low and she has had a couple urinary tract infections treated by her PCP since I last saw her.  She is not complaining of any respiratory complaints.  Her stomach is pretty settled down, but she still has diarrhea on occasion.  She does see her gastroenterologist on a regular basis.  She has otherwise felt fairly well.  She does have a very busy holiday schedule this coming week.      Remainder of a 10-point review of systems unchanged.    PHYSICAL EXAMINATION:    She is accompanied by her husband.  She is alert, oriented and otherwise in no acute distress.  Her throat is clear.  No ulceration.  Hearing is grossly intact.    No lymph nodes in the cervical, supra clavicular areas.    Chest wall: No signs of recurrence.  Port is accessed.    Abdomen without ascites, jaundice or organomegaly.    There is no lower extremity edema.  She is not clubbed.    Skin without rash or urticaria.  She does have some scattered ecchymoses and some precancerous lesions.  Musculoskeletal exam:  No fractures or arthritis.    Neurologic:  Mental status , alert and oriented.  Cranial nerves grossly intact.  Remainder unchanged.      Laboratory tests all per flow sheet.      IMPRESSION:    1.  Ready to continue IVIG.  She is quite familiar with side effects.  2.  She will return as directed.  3.  The patient will see the dermatologist regarding her skin issues.  4.  I have cautioned her to let us know if she has additional signs or symptoms of infection.  She will return as noted.    EEL/kmh  [id number]

## 2011-08-12 ENCOUNTER — Inpatient Hospital Stay: Admit: 2011-08-12 | Payer: MEDICARE | Attending: Hematology & Oncology

## 2011-08-12 DIAGNOSIS — C50919 Malignant neoplasm of unspecified site of unspecified female breast: Secondary | ICD-10-CM

## 2011-08-12 LAB — COMPREHENSIVE METABOLIC PANEL
1/Creatinine: 1.12 ratio
ALT: 25 U/L (ref 6–40)
AST: 27 U/L (ref 16–46)
Albumin: 4 g/dL (ref 3.6–5.1)
Alkaline Phosphatase: 80 U/L (ref 33–130)
Anion Gap: 13 mmol/L (ref 3–16)
BUN: 21 mg/dL (ref 7–25)
CO2: 30 mmol/L (ref 21–33)
Calcium: 8.9 mg/dL (ref 8.6–10.2)
Chloride: 101 mmol/L (ref 98–110)
Creatinine: 0.89 mg/dL (ref 0.50–1.20)
GFR MDRD Af Amer: 74 See note.
GFR MDRD Non Af Amer: 61 See note.
Glucose: 96 mg/dL (ref 65–99)
Osmolality, Calculated: 301 mOsm/kg (ref 278–305)
Potassium: 4.2 mmol/L (ref 3.5–5.3)
Sodium: 144 mmol/L (ref 135–146)
Total Bilirubin: 0.7 mg/dL (ref 0.2–1.2)
Total Protein: 7.2 g/dL (ref 6.2–8.3)

## 2011-08-12 LAB — IGG: IgG: 620 mg/dL — ABNORMAL LOW (ref 751.0–1560.0)

## 2011-08-12 LAB — CBC
Hematocrit: 41.2 % (ref 35.0–45.0)
Hemoglobin: 13.7 g/dL (ref 11.7–15.5)
MCH: 29.7 pg (ref 27.0–33.0)
MCHC: 33.2 g/dL (ref 32.0–36.0)
MCV: 89.4 fL (ref 80.0–100.0)
MPV: 10.1 fL (ref 7.5–11.5)
Platelets: 113 10*3/uL (ref 140–400)
RBC: 4.61 10*6/uL (ref 3.80–5.10)
RDW: 13.9 % (ref 11.0–15.0)
WBC: 4.7 10*3/uL (ref 3.8–10.8)

## 2011-08-12 LAB — DIFFERENTIAL
Basophils Absolute: 9 /uL (ref 0–200)
Basophils Relative: 0.2 % (ref 0.0–1.0)
Eosinophils Absolute: 56 /uL (ref 15–500)
Eosinophils Relative: 1.2 % (ref 0.0–8.0)
Lymphocytes Absolute: 555 /uL (ref 850–3900)
Lymphocytes Relative: 11.8 % (ref 15.0–45.0)
Monocytes Absolute: 418 /uL (ref 200–950)
Monocytes Relative: 8.9 % (ref 0.0–12.0)
Neutrophils Absolute: 3661 /uL (ref 1500–7800)
Neutrophils Relative: 77.9 % (ref 40.0–80.0)

## 2011-08-12 LAB — KAPPA/LAMBDA, FREE LIGHT CHAINS, SERUM
Kappa/Lambda Ratio: 0.33 ratio (ref 0.26–1.65)
Kappa: 2.7 mg/L (ref 3.3–19.4)
Lambda: 8.2 mg/L (ref 5.7–26.3)

## 2011-08-12 LAB — IGA: IgA: 34.2 mg/dL — ABNORMAL LOW (ref 82.0–453.0)

## 2011-08-12 LAB — IGM: IgM: 26.7 mg/dL — ABNORMAL LOW (ref 46.0–304.0)

## 2011-08-12 LAB — LACTATE DEHYDROGENASE: LD: 481 U/L (ref 313–618)

## 2011-08-13 ENCOUNTER — Ambulatory Visit: Admit: 2011-08-13 | Discharge: 2011-08-13 | Payer: MEDICARE | Attending: Hematology & Oncology

## 2011-08-13 ENCOUNTER — Ambulatory Visit: Admit: 2011-08-13 | Payer: MEDICARE

## 2011-08-13 DIAGNOSIS — C8589 Other specified types of non-Hodgkin lymphoma, extranodal and solid organ sites: Secondary | ICD-10-CM

## 2011-08-13 MED ORDER — sodium chloride 0.9 % flush 10 mL
Freq: Every day | INTRAMUSCULAR | Status: AC | PRN
Start: 2011-08-13 — End: 2011-08-13

## 2011-08-13 MED ORDER — hydrocortisone sod succ (PF) (SOLU-CORTEF) 100 mg/2 mL injection SolR 100 mg
100 | Freq: Every day | INTRAMUSCULAR | Status: AC | PRN
Start: 2011-08-13 — End: 2011-08-13

## 2011-08-13 MED ORDER — diphenhydrAMINE (BENADRYL) capsule 25 mg
25 | Freq: Once | ORAL | Status: AC
Start: 2011-08-13 — End: 2011-08-13

## 2011-08-13 MED ORDER — albuterol (PROVENTIL HFA;VENTOLIN HFA) inhaler 1-2 puff
90 | RESPIRATORY_TRACT | Status: AC | PRN
Start: 2011-08-13 — End: 2011-08-13

## 2011-08-13 MED ORDER — 0.9%  NaCl infusion
Freq: Every day | INTRAVENOUS | Status: AC | PRN
Start: 2011-08-13 — End: 2011-08-13

## 2011-08-13 MED ORDER — heparin lock flush 100 unit/mL syringe Syrg 500 Units
100 | Freq: Every day | INTRAVENOUS | Status: AC | PRN
Start: 2011-08-13 — End: 2011-08-13

## 2011-08-13 MED ORDER — acetaminophen (TYLENOL) tablet 650 mg
325 | Freq: Once | ORAL | Status: AC
Start: 2011-08-13 — End: 2011-08-13

## 2011-08-13 MED ORDER — diphenhydrAMINE (BENADRYL) injection 25 mg
50 | Freq: Every day | INTRAMUSCULAR | Status: AC | PRN
Start: 2011-08-13 — End: 2011-08-13

## 2011-08-13 MED ORDER — riTUXimab (RITUXAN) 640 mg in sodium chloride 0.9 % 320 mL infusion
10 | Freq: Once | INTRAVENOUS | Status: AC
Start: 2011-08-13 — End: 2011-08-13
  Administered 2011-08-13: 15:00:00 via INTRAVENOUS

## 2011-08-13 MED ORDER — dexamethasone (DECADRON) tablet 10 mg
4 | Freq: Two times a day (BID) | ORAL | Status: AC
Start: 2011-08-13 — End: 2011-08-13
  Administered 2011-08-13: 15:00:00 via ORAL

## 2011-08-13 MED ORDER — immune globulin (human) (IgG) (GAMMAGARD) Soln 25 g
10 | Freq: Once | INTRAVENOUS | Status: AC
Start: 2011-08-13 — End: 2011-08-13
  Administered 2011-08-13: 18:00:00 via INTRAVENOUS

## 2011-08-13 MED ORDER — acetaminophen (TYLENOL) tablet 650 mg
325 | Freq: Once | ORAL | Status: AC
Start: 2011-08-13 — End: 2011-08-13
  Administered 2011-08-13: 15:00:00 via ORAL

## 2011-08-13 MED ORDER — diphenhydrAMINE (BENADRYL) capsule 50 mg
25 | Freq: Once | ORAL | Status: AC
Start: 2011-08-13 — End: 2011-08-13
  Administered 2011-08-13: 15:00:00 via ORAL

## 2011-08-13 MED ORDER — EPINEPHrine (ADRENALIN) injection 0.3 mg
1 | Freq: Every day | INTRAMUSCULAR | Status: AC | PRN
Start: 2011-08-13 — End: 2011-08-13

## 2011-08-13 MED FILL — TYLENOL 325 MG TABLET: 325 325 mg | ORAL | Qty: 2

## 2011-08-13 MED FILL — DEXAMETHASONE 4 MG TABLET: 4 4 MG | ORAL | Qty: 3

## 2011-08-13 MED FILL — DIPHENHYDRAMINE 25 MG CAPSULE: 25 25 mg | ORAL | Qty: 2

## 2011-08-13 MED FILL — RITUXAN 10 MG/ML CONCENTRATE,INTRAVENOUS: 10 10 mg/mL | INTRAVENOUS | Qty: 64

## 2011-08-13 MED FILL — GAMMAGARD LIQUID 10 % INJECTION SOLUTION: 10 10 % | INTRAMUSCULAR | Qty: 250

## 2011-08-13 NOTE — Unmapped (Signed)
See dictated note

## 2011-08-13 NOTE — Unmapped (Signed)
Chemotherapy consent signed and in Epic.  See clinic note for v/s, med update and pain assessment.  Pt here for Rituxan and IVIG tx.  1st time receiving both on the same day here at [facility].  See clinic note for v/s, med update and pain assessment.  Pt requested that we try running Rituxan alittle faster this time.  Rituxan was infused over 2hrs and 30 min instead of 3 hrs and tolerated well by pt.  Pt tolerated IVIG infusion well also.  PIV discontinued, bandaide applied.  Left ambulatory.

## 2011-08-17 NOTE — Unmapped (Signed)
DICTATED ADDENDUM.  PROVIDER TO REVIEW AND SIGN    DIAGNOSIS(ES):  History of breast cancer, history non-Hodgkin's lymphoma, on Rituxan and IVIG.    Ashley Madden is here for follow-up.  Overall she is actually doing pretty well today.  She has a lot of back pain in the morning.  This seems to be getting a little bit worse.  It has not been bad enough to necessitate medication but it takes her a while to get going.  It is an area in which she previously had disease and I have told her I think we should repeat her imaging to be sure things are stable.  She does plan a big vacation trip out [place] and then when she comes back she will be here a short time and take off again for [place].  She has not had any signs of intercurrent infection.  She tolerates her IVIG well as well as her Rituxan.  She is also due for a mammogram. Occasionally she has some shortness of breath but it is pretty much back to her baseline.  She also is due for restaging with a chest CT as well.    PHYSICAL EXAMINATION:  Vital signs per flow sheet.  She is alert, oriented and otherwise in no acute distress.  Throat is clear.  Hearing is grossly intact.  No supraclavicular, cervical or infraclavicular adenopathy.  Cardiac exam: No murmurs, rubs or gallops.  Lungs are clear.  Chest wall is negative.  Abdomen without ascites, jaundice or organomegaly.  There is no lower extremity edema.  She is not clubbed.  Skin is without rash.  She does have some ____ changes present which are not new for her.  There is no ecchymoses.  Musculoskeletal: No arthritis.  No fractures.  Neurologic: Mental status alert and oriented.  Cranial nerves grossly intact.  Motor and sensory grossly intact.  Gait unremarkable.      Laboratory tests per flow sheet.    IMPRESSION AND PLAN:   1. Ready to continue with IVIG and Rituxan.  2. Patient was given copies of today's blood work.  3. She will have MRIs of the thoracic and lumbar spine to follow-up on her back pain.  4. She is  due for chest CT as part of restaging for her lymphoma.  5. Questions were addressed.  6. Mammogram will be ordered.    7. She will return as directed.    EEL/jwa  [id number]

## 2011-08-17 NOTE — Unmapped (Signed)
Left patient a message to schedule Ct, Mammogram ( @ [facility] ) and MRI

## 2011-08-31 MED ORDER — LORazepam (ATIVAN) 1 MG tablet
1 | ORAL_TABLET | ORAL | Status: AC
Start: 2011-08-31 — End: 2011-09-17

## 2011-08-31 NOTE — Unmapped (Signed)
Mri is scheduled for Thursday am at [facility] needs a medication to calm her down. Please call her back. She had Ativan 1 mg last time, has 4 pill left.

## 2011-08-31 NOTE — Unmapped (Signed)
I was not here yesterday and coverage was in process of trying to get back to patient.  Explained this to patient. Dr Lower had already approved renewal of 1 mg Ativan for patient's MRI when I discussed w/her this afternoon.  Patient states her Ativan is from 2009 and is expired.   We will renew; called into pharmacist at confirmed pharmacy.

## 2011-09-02 ENCOUNTER — Inpatient Hospital Stay: Admit: 2011-09-02 | Payer: MEDICARE | Attending: Hematology & Oncology

## 2011-09-02 DIAGNOSIS — C50919 Malignant neoplasm of unspecified site of unspecified female breast: Secondary | ICD-10-CM

## 2011-09-02 DIAGNOSIS — J8410 Pulmonary fibrosis, unspecified: Secondary | ICD-10-CM

## 2011-09-02 MED ORDER — GADAVIST (gadobutrol) Soln 0.1 mL/kg
1 | Freq: Once | INTRAVENOUS | Status: AC | PRN
Start: 2011-09-02 — End: 2011-09-02
  Administered 2011-09-02: 16:00:00 via INTRAVENOUS

## 2011-09-03 NOTE — Unmapped (Signed)
Left message for patient that CT chest of 09/02/11 revealed no recurrence of lymphoma.

## 2011-09-07 NOTE — Unmapped (Signed)
Message copied by Lyndle Herrlich on Tue Sep 07, 2011 10:51 AM  ------       Message from: BURNS, BRENDA S       Created: Tue Sep 07, 2011 10:00 AM         Please let patient know the MRIs (done for back pain assessed at July visit) only show arthritis/degenerative changes; no cancer.       We can send to internist, etc if patient desires.       Thanks!       ----- Message -----          From: [name], MD          Sent: 09/06/2011   7:57 PM            To: Norton Blizzard, RN              notify       ----- Message -----          From: Rad Results In Interface          Sent: 09/06/2011  11:31 AM            To: [name], MD

## 2011-09-08 NOTE — Unmapped (Signed)
Patient states she was in hurry to call back as the radiologist who is her friend told her results. She knows that her the MRI of the T and L Spine showed arthritis. She states to fax to her PCP, Dr. Brain Hilts

## 2011-09-08 NOTE — Unmapped (Signed)
Faxed results to [phone]. Phone [phone].  Confirmation rec'd that fax went through--bdyer

## 2011-09-17 ENCOUNTER — Ambulatory Visit: Admit: 2011-09-17 | Discharge: 2011-09-17 | Payer: MEDICARE | Attending: Hematology & Oncology

## 2011-09-17 ENCOUNTER — Inpatient Hospital Stay: Admit: 2011-09-17 | Payer: MEDICARE | Attending: Hematology & Oncology

## 2011-09-17 ENCOUNTER — Ambulatory Visit: Admit: 2011-09-17 | Payer: MEDICARE

## 2011-09-17 DIAGNOSIS — C50919 Malignant neoplasm of unspecified site of unspecified female breast: Secondary | ICD-10-CM

## 2011-09-17 DIAGNOSIS — C8590 Non-Hodgkin lymphoma, unspecified, unspecified site: Secondary | ICD-10-CM

## 2011-09-17 DIAGNOSIS — D809 Immunodeficiency with predominantly antibody defects, unspecified: Secondary | ICD-10-CM

## 2011-09-17 LAB — CBC
Hematocrit: 37.7 % (ref 35.0–45.0)
Hemoglobin: 12.8 g/dL (ref 11.7–15.5)
MCH: 30.2 pg (ref 27.0–33.0)
MCHC: 34 g/dL (ref 32.0–36.0)
MCV: 89 fL (ref 80.0–100.0)
MPV: 10.7 fL (ref 7.5–11.5)
Platelet Estimate: DECREASED
Platelets: 124 10*3/uL (ref 140–400)
RBC: 4.24 10*6/uL (ref 3.80–5.10)
RDW: 13.9 % (ref 11.0–15.0)
WBC: 3.8 10*3/uL (ref 3.8–10.8)

## 2011-09-17 LAB — DIFFERENTIAL
Lymphocytes Absolute: 570 /uL (ref 850–3900)
Lymphocytes Relative: 15 % (ref 15.0–45.0)
Monocytes Absolute: 228 /uL (ref 200–950)
Monocytes Relative: 6 % (ref 0.0–12.0)
Neutrophils Absolute: 3002 /uL (ref 1500–7800)
Neutrophils Relative: 79 % (ref 40.0–80.0)

## 2011-09-17 LAB — COMPREHENSIVE METABOLIC PANEL
ALT: 24 U/L (ref 6–40)
AST: 24 U/L (ref 16–46)
Albumin: 3.9 g/dL (ref 3.6–5.1)
Alkaline Phosphatase: 65 U/L (ref 33–130)
Anion Gap: 15 mmol/L (ref 3–16)
BUN: 23 mg/dL (ref 7–25)
CO2: 28 mmol/L (ref 21–33)
Calcium: 8.6 mg/dL (ref 8.6–10.2)
Chloride: 102 mmol/L (ref 98–110)
Creatinine: 0.71 mg/dL (ref 0.50–1.20)
GFR MDRD Af Amer: 96 See note.
GFR MDRD Non Af Amer: 79 See note.
Glucose: 137 mg/dL (ref 65–99)
Osmolality, Calculated: 306 mOsm/kg (ref 278–305)
Potassium: 4 mmol/L (ref 3.5–5.3)
Sodium: 145 mmol/L (ref 135–146)
Total Bilirubin: 0.6 mg/dL (ref 0.2–1.2)
Total Protein: 6.5 g/dL (ref 6.2–8.3)

## 2011-09-17 LAB — LACTATE DEHYDROGENASE: LD: 551 U/L (ref 313–618)

## 2011-09-17 MED ORDER — immune globulin (human) (IgG) (GAMMAGARD) Soln 25 g
10 | Freq: Once | INTRAVENOUS | Status: AC
Start: 2011-09-17 — End: 2011-09-17
  Administered 2011-09-17: 16:00:00 via INTRAVENOUS

## 2011-09-17 MED ORDER — 0.9%  NaCl infusion
Freq: Every day | INTRAVENOUS | Status: AC | PRN
Start: 2011-09-17 — End: 2011-09-18

## 2011-09-17 MED ORDER — EPINEPHrine (ADRENALIN) injection 0.3 mg
1 | Freq: Every day | INTRAMUSCULAR | Status: AC | PRN
Start: 2011-09-17 — End: 2011-09-18

## 2011-09-17 MED ORDER — diphenhydrAMINE (BENADRYL) capsule 25 mg
25 | Freq: Once | ORAL | Status: AC
Start: 2011-09-17 — End: 2011-09-17
  Administered 2011-09-17: 16:00:00 via ORAL

## 2011-09-17 MED ORDER — heparin lock flush 100 unit/mL syringe Syrg 500 Units
100 | Freq: Every day | INTRAVENOUS | Status: AC | PRN
Start: 2011-09-17 — End: 2011-09-18

## 2011-09-17 MED ORDER — sodium chloride 0.9 % flush 10 mL
Freq: Every day | INTRAMUSCULAR | Status: AC | PRN
Start: 2011-09-17 — End: 2011-09-18

## 2011-09-17 MED ORDER — acetaminophen (TYLENOL) 325 MG tablet
325 | ORAL | Status: AC
Start: 2011-09-17 — End: 2011-09-17

## 2011-09-17 MED ORDER — acetaminophen (TYLENOL) tablet 650 mg
325 | Freq: Once | ORAL | Status: AC
Start: 2011-09-17 — End: 2011-09-22

## 2011-09-17 MED ORDER — hydrocortisone sod succ (PF) (SOLU-CORTEF) 100 mg/2 mL injection SolR 100 mg
100 | Freq: Every day | INTRAMUSCULAR | Status: AC | PRN
Start: 2011-09-17 — End: 2011-09-18

## 2011-09-17 MED ORDER — diphenhydrAMINE (BENADRYL) capsule 50 mg
25 | Freq: Once | ORAL | Status: AC
Start: 2011-09-17 — End: 2011-09-22

## 2011-09-17 MED ORDER — dexamethasone (DECADRON) tablet 10 mg
4 | Freq: Two times a day (BID) | ORAL | Status: AC
Start: 2011-09-17 — End: 2011-09-22
  Administered 2011-09-17: 16:00:00 via ORAL

## 2011-09-17 MED ORDER — diphenhydrAMINE (BENADRYL) injection 25 mg
50 | Freq: Every day | INTRAMUSCULAR | Status: AC | PRN
Start: 2011-09-17 — End: 2011-09-18

## 2011-09-17 MED ORDER — albuterol (PROVENTIL HFA;VENTOLIN HFA) inhaler 1-2 puff
90 | RESPIRATORY_TRACT | Status: AC | PRN
Start: 2011-09-17 — End: 2011-09-18

## 2011-09-17 MED ORDER — acetaminophen (TYLENOL) tablet 650 mg
325 | Freq: Once | ORAL | Status: AC
Start: 2011-09-17 — End: 2011-09-17
  Administered 2011-09-17: 16:00:00 via ORAL

## 2011-09-17 MED ORDER — riTUXimab (RITUXAN) 640 mg in sodium chloride 0.9 % 320 mL infusion
10 | Freq: Once | INTRAVENOUS | Status: AC
Start: 2011-09-17 — End: 2011-09-17
  Administered 2011-09-17: 18:00:00 via INTRAVENOUS

## 2011-09-17 MED FILL — TYLENOL 325 MG TABLET: 325 325 mg | ORAL | Qty: 2

## 2011-09-17 MED FILL — GAMMAGARD LIQUID 10 % INJECTION SOLUTION: 10 10 % | INTRAMUSCULAR | Qty: 250

## 2011-09-17 MED FILL — RITUXAN 10 MG/ML CONCENTRATE,INTRAVENOUS: 10 10 mg/mL | INTRAVENOUS | Qty: 64

## 2011-09-17 NOTE — Unmapped (Signed)
Pt here for IVIG and for Remicade.  See clinic note for v/s, med update and pain assessment.  Pt usually takes Remicade over several hrs d/t it causing her to feel bad for several days after infusion but wants to try infusing faster today.  Remicade infused over  ~2 hrs and 30 minutes. Pt tolerated both drugs well today.  PIV discontinued, bandaide applied.  Left ambulatory.

## 2011-09-17 NOTE — Unmapped (Signed)
See dictated note

## 2011-09-22 NOTE — Unmapped (Signed)
DICTATED ADDENDUM.  PROVIDER TO REVIEW AND SIGN    DIAGNOSIS(ES):  Non-Hodgkin's lymphoma, history of breast cancer.      Ms. Fontenot is here for continuation of Rituxan and IVIG.  Overall she is in very good spirits.  She has had no intercurrent infection.  No cough, shortness of breath, GI, GU or other major change.  She still is having her back pain.  Since I last saw her she had extensive imaging performed which did not show anything serious.  She does have some arthritis.  There were no signs of lymphoma, etc.  She is not coughing.  No other GI/GU complaints.  We did discuss that I think we should probably continue her Rituxan and IVIG support until they leave for [place] at which time I think we can discontinue her therapy and reevaluate her approximately 3 to 4 months later to make sure things are otherwise stable.  She is quite apprehensive about this.  Her husband does not accompany her today and I have told her we can discuss this again in a couple months but I think overall that would be a very warranted plan.  She does understand if we see a return of her disease activity we could certainly restart things down the road but hopefully that will not be the case.  She otherwise is in good spirits.  I did tell her she is due for bone density which we will order as well.  Mammogram was performed in August.      Remainder of a 10-point review of systems is unchanged.    PHYSICAL EXAMINATION:  Vital signs per flow sheet.  She is alert, oriented and otherwise in no acute distress.  Throat is clear.  Hearing is grossly intact.  No supra, infraclavicular or cervical adenopathy.  Cardiac exam: No murmurs, rubs or gallops.  Breast area was not examined.  Abdomen soft without ascites, jaundice or organomegaly.  There is no lower extremity edema.  She is not clubbed.  Skin is without rash.  No ecchymoses.  Musculoskeletal: No active arthritis.  She does have some mild musculoskeletal issues.  No fractures.  Neurologic:  Mental status alert and oriented.  Cranial nerves grossly intact.  Motor and sensory grossly intact.  Gait unremarkable.  Psychiatric: No anxiety or depression today.      Laboratory tests reviewed.      MRI report already reviewed with her.    IMPRESSION:  Clinically doing quite well.    PLAN:    1. Patient will continue with current IVIG as well as Rituxan today.    2. Copies of lab work will be given once they have returned.    3. We did discuss stopping treatment and observing.  Will discuss this again when I see her husband.    4. Questions were addressed.    EEL/jwa  [id number]

## 2011-10-22 NOTE — Unmapped (Signed)
Patient states she got card in mail we could not reach her for her dexa. I gave the patient the date and time of her DEXA. She is aware this was ordered by Dr. Harrie Jeans

## 2011-10-27 ENCOUNTER — Inpatient Hospital Stay: Admit: 2011-10-27 | Payer: MEDICARE | Attending: Hematology & Oncology

## 2011-10-27 DIAGNOSIS — C50919 Malignant neoplasm of unspecified site of unspecified female breast: Secondary | ICD-10-CM

## 2011-11-02 NOTE — Unmapped (Signed)
Normal bone mineral density in the lumbar spine (mean L1 through L4).   Spondylosis and/or scoliosis are present which spuriously elevate lumbar   bone mineral density. L1 has a T-score of -1.9 and therefore may be at   risk for fracture.     This patient's T-score in the left hip meets the World Health   Organization's guidelines for moderate osteopenia.

## 2011-11-02 NOTE — Unmapped (Signed)
Message copied by Lyndle Herrlich on Tue Nov 02, 2011  4:10 PM  ------       Message from: BURNS, BRENDA S       Created: Fri Oct 29, 2011 10:40 AM                       ----- Message -----          From: [name], MD          Sent: 10/28/2011   8:18 PM            To: Norton Blizzard, RN              notify       ----- Message -----          From: Rad Results In Interface          Sent: 10/28/2011   1:19 PM            To: [name], MD                       ------

## 2011-11-03 NOTE — Unmapped (Signed)
LM with husband for call back--he knows it is not urgent--he states she has an appt this week---bdyer

## 2011-11-04 NOTE — Unmapped (Signed)
Patient is aware that DEXA is normal in lumbar spine and shows osteopenia in hip. She state she takes Ca with Vit D once a day. She is aware that Dr. Samara Snide recommends this in split dose fashion for good bone health. She is aware Dr. Samara Snide will have a copy for her tomorrow at her appt--bdyer

## 2011-11-05 ENCOUNTER — Ambulatory Visit: Admit: 2011-11-05 | Discharge: 2011-11-05 | Payer: MEDICARE

## 2011-11-05 ENCOUNTER — Ambulatory Visit: Admit: 2011-11-05 | Discharge: 2011-11-05 | Payer: MEDICARE | Attending: Hematology & Oncology

## 2011-11-05 ENCOUNTER — Inpatient Hospital Stay: Admit: 2011-11-05 | Payer: MEDICARE

## 2011-11-05 DIAGNOSIS — C8590 Non-Hodgkin lymphoma, unspecified, unspecified site: Secondary | ICD-10-CM

## 2011-11-05 DIAGNOSIS — D809 Immunodeficiency with predominantly antibody defects, unspecified: Secondary | ICD-10-CM

## 2011-11-05 DIAGNOSIS — C50919 Malignant neoplasm of unspecified site of unspecified female breast: Secondary | ICD-10-CM

## 2011-11-05 LAB — COMPREHENSIVE METABOLIC PANEL
ALT: 26 U/L (ref 6–40)
AST: 34 U/L (ref 16–46)
Albumin: 4.2 g/dL (ref 3.6–5.1)
Alkaline Phosphatase: 67 U/L (ref 33–130)
Anion Gap: 7 mmol/L (ref 3–16)
BUN: 18 mg/dL (ref 7–25)
CO2: 32 mmol/L (ref 21–33)
Calcium: 8.7 mg/dL (ref 8.6–10.2)
Chloride: 100 mmol/L (ref 98–110)
Creatinine: 0.73 mg/dL (ref 0.50–1.20)
GFR MDRD Af Amer: 93 See note.
GFR MDRD Non Af Amer: 77 See note.
Glucose: 144 mg/dL — ABNORMAL HIGH (ref 65–99)
Osmolality, Calculated: 292 mosm/kg (ref 278–305)
Potassium: 4.5 mmol/L (ref 3.5–5.3)
Sodium: 139 mmol/L (ref 135–146)
Total Bilirubin: 0.8 mg/dL (ref 0.2–1.2)
Total Protein: 6.4 g/dL (ref 6.2–8.3)

## 2011-11-05 LAB — DIFFERENTIAL
Basophils Absolute: 9 /uL (ref 0–200)
Basophils Relative: 0.2 % (ref 0.0–1.0)
Eosinophils Absolute: 34 /uL (ref 15–500)
Eosinophils Relative: 0.8 % (ref 0.0–8.0)
Lymphocytes Absolute: 593 /uL (ref 850–3900)
Lymphocytes Relative: 13.8 % (ref 15.0–45.0)
Monocytes Absolute: 417 /uL (ref 200–950)
Monocytes Relative: 9.7 % (ref 0.0–12.0)
Neutrophils Absolute: 3247 /uL (ref 1500–7800)
Neutrophils Relative: 75.5 % (ref 40.0–80.0)

## 2011-11-05 LAB — CBC
Hematocrit: 39.6 % (ref 35.0–45.0)
Hemoglobin: 13.5 g/dL (ref 11.7–15.5)
MCH: 30.1 pg (ref 27.0–33.0)
MCHC: 34 g/dL (ref 32.0–36.0)
MCV: 88.7 fL (ref 80.0–100.0)
MPV: 10.8 fL (ref 7.5–11.5)
Platelets: 136 10E3/uL — ABNORMAL LOW (ref 140–400)
RBC: 4.47 10E6/uL (ref 3.80–5.10)
RDW: 13.2 % (ref 11.0–15.0)
WBC: 4.3 10E3/uL (ref 3.8–10.8)

## 2011-11-05 MED ORDER — sodium chloride 0.9 % flush 10 mL
Freq: Every day | INTRAMUSCULAR | Status: AC | PRN
Start: 2011-11-05 — End: 2011-11-06

## 2011-11-05 MED ORDER — albuterol (PROVENTIL HFA;VENTOLIN HFA) inhaler 1-2 puff
90 | RESPIRATORY_TRACT | Status: AC | PRN
Start: 2011-11-05 — End: 2011-11-06

## 2011-11-05 MED ORDER — heparin lock flush 100 unit/mL Syrg 500 Units
100 | Freq: Every day | INTRAVENOUS | Status: AC | PRN
Start: 2011-11-05 — End: 2011-11-06

## 2011-11-05 MED ORDER — EPINEPHrine (ADRENALIN) injection 0.3 mg
1 | Freq: Every day | INTRAMUSCULAR | Status: AC | PRN
Start: 2011-11-05 — End: 2011-11-06

## 2011-11-05 MED ORDER — hydrocortisone sod succ (PF) (SOLU-CORTEF) 100 mg/2 mL injection SolR 100 mg
100 | Freq: Every day | INTRAMUSCULAR | Status: AC | PRN
Start: 2011-11-05 — End: 2011-11-06

## 2011-11-05 MED ORDER — diphenhydrAMINE (BENADRYL) capsule 25 mg
25 | Freq: Once | ORAL | Status: AC
Start: 2011-11-05 — End: 2011-11-05
  Administered 2011-11-05: 15:00:00 via ORAL

## 2011-11-05 MED ORDER — immune globulin (human) (IgG) (GAMMAGARD) Soln 25 g
10 | Freq: Once | INTRAVENOUS | Status: AC
Start: 2011-11-05 — End: 2011-11-05
  Administered 2011-11-05: 16:00:00 via INTRAVENOUS

## 2011-11-05 MED ORDER — diphenhydrAMINE (BENADRYL) injection 25 mg
50 | Freq: Every day | INTRAMUSCULAR | Status: AC | PRN
Start: 2011-11-05 — End: 2011-11-06

## 2011-11-05 MED ORDER — sodium chloride 0.9 % infusion
Freq: Every day | INTRAVENOUS | Status: AC | PRN
Start: 2011-11-05 — End: 2011-11-06
  Administered 2011-11-05: 15:00:00 via INTRAVENOUS

## 2011-11-05 MED ORDER — acetaminophen (TYLENOL) tablet 650 mg
325 | Freq: Once | ORAL | Status: AC
Start: 2011-11-05 — End: 2011-11-05
  Administered 2011-11-05: 15:00:00 via ORAL

## 2011-11-05 MED FILL — GAMMAGARD LIQUID 10 % INJECTION SOLUTION: 10 10 % | INTRAMUSCULAR | Qty: 250

## 2011-11-05 NOTE — Unmapped (Signed)
Patient would like her recent scans faxed to her PCP, Dr. Marlane Hatcher

## 2011-11-05 NOTE — Unmapped (Signed)
See dictated note

## 2011-11-05 NOTE — Unmapped (Signed)
Faxed testing--Confirmation rec'd that fax went through--bdyer

## 2011-11-09 NOTE — Unmapped (Signed)
DICTATED ADDENDUM.  PROVIDER TO REVIEW AND SIGN     DIAGNOSIS(ES):  History of breast cancer, history of non-Hodgkin's lymphoma.      [name] is here for follow-up.  Overall she is actually doing very well.  I discussed with her today discontinuing her Rituxan and just following along.  She will need IVIG today.  She is not having any respiratory difficulties today but does realize this could become more problematic.  She is going to [place] and will be back sometime in mid-December and we will check her IVIG levels at that time as well.  She otherwise feels well.  No cough, shortness of breath, GI, GU or other major complaints.      Remainder of a 10-point review of systems is otherwise unchanged.    PHYSICAL EXAMINATION:  Vital signs per flow sheet.  She is alert, oriented and otherwise in no acute distress.  Throat is clear.  Hearing grossly intact.  No supra, infraclavicular adenopathy.  Lungs are clear.  Cardiac exam, no murmurs, rubs or gallops.  Abdomen without ascites, jaundice, organomegaly.  No lower extremity edema.  She is not clubbed.  Skin without rash.  No ecchymoses.  Musculoskeletal, no active arthritis or fractures.  Neurologic, mental status alert and oriented.  Cranial nerves grossly intact.  Motor and sensory grossly intact.  Gait is unremarkable.      Labs all per flow sheet.    IMPRESSION:  Clinically doing very well.    PLAN:    1.  Patient will continue with current regimen of just single agent IVIG.  2.  Questions regarding labs, bone density addressed.  She will return as noted.    EEL/wls  [id number]

## 2012-01-03 ENCOUNTER — Ambulatory Visit: Admit: 2012-01-03 | Payer: MEDICARE

## 2012-01-03 ENCOUNTER — Inpatient Hospital Stay: Admit: 2012-01-03 | Payer: MEDICARE | Attending: Hematology & Oncology

## 2012-01-03 ENCOUNTER — Ambulatory Visit: Admit: 2012-01-03 | Payer: MEDICARE | Attending: Hematology & Oncology

## 2012-01-03 DIAGNOSIS — D809 Immunodeficiency with predominantly antibody defects, unspecified: Secondary | ICD-10-CM

## 2012-01-03 DIAGNOSIS — C8590 Non-Hodgkin lymphoma, unspecified, unspecified site: Secondary | ICD-10-CM

## 2012-01-03 DIAGNOSIS — C50919 Malignant neoplasm of unspecified site of unspecified female breast: Secondary | ICD-10-CM

## 2012-01-03 LAB — KAPPA/LAMBDA, FREE LIGHT CHAINS, SERUM
Kappa/Lambda Ratio: 0.55 ratio (ref 0.26–1.65)
Kappa: 3.6 mg/L (ref 3.3–19.4)
Lambda: 6.6 mg/L (ref 5.7–26.3)

## 2012-01-03 LAB — DIFFERENTIAL
Basophils Absolute: 9 /uL (ref 0–200)
Basophils Relative: 0.2 % (ref 0.0–1.0)
Eosinophils Absolute: 106 /uL (ref 15–500)
Eosinophils Relative: 2.3 % (ref 0.0–8.0)
Lymphocytes Absolute: 695 /uL (ref 850–3900)
Lymphocytes Relative: 15.1 % (ref 15.0–45.0)
Monocytes Absolute: 570 /uL (ref 200–950)
Monocytes Relative: 12.4 % (ref 0.0–12.0)
Neutrophils Absolute: 3220 /uL (ref 1500–7800)
Neutrophils Relative: 70 % (ref 40.0–80.0)

## 2012-01-03 LAB — COMPREHENSIVE METABOLIC PANEL
ALT: 33 U/L (ref 13–69)
AST: 45 U/L (ref 14–65)
Albumin: 4.1 g/dL (ref 3.6–5.1)
Alkaline Phosphatase: 85 U/L (ref 33–130)
Anion Gap: 8 mmol/L (ref 3–16)
BUN: 20 mg/dL (ref 7–25)
CO2: 32 mmol/L (ref 21–33)
Calcium: 9.3 mg/dL (ref 8.6–10.2)
Chloride: 102 mmol/L (ref 98–110)
Creatinine: 0.77 mg/dL (ref 0.50–1.20)
GFR MDRD Af Amer: 87 See note.
GFR MDRD Non Af Amer: 72 See note.
Glucose: 97 mg/dL (ref 65–99)
Osmolality, Calculated: 297 mOsm/kg (ref 278–305)
Potassium: 4.6 mmol/L (ref 3.5–5.3)
Sodium: 142 mmol/L (ref 135–146)
Total Bilirubin: 0.7 mg/dL (ref 0.2–1.2)
Total Protein: 6.3 g/dL (ref 6.2–8.3)

## 2012-01-03 LAB — CBC
Hematocrit: 40.6 % (ref 35.0–45.0)
Hemoglobin: 13.5 g/dL (ref 11.7–15.5)
MCH: 29.8 pg (ref 27.0–33.0)
MCHC: 33.3 g/dL (ref 32.0–36.0)
MCV: 89.3 fL (ref 80.0–100.0)
MPV: 11.1 fL (ref 7.5–11.5)
Platelets: 145 10*3/uL (ref 140–400)
RBC: 4.55 10*6/uL (ref 3.80–5.10)
RDW: 13.6 % (ref 11.0–15.0)
WBC: 4.6 10*3/uL (ref 3.8–10.8)

## 2012-01-03 LAB — IGG: IgG: 627 mg/dL (ref 751.0–1560.0)

## 2012-01-03 LAB — IGM: IgM: 81.7 mg/dL (ref 46.0–304.0)

## 2012-01-03 LAB — IGA: IgA: 55.7 mg/dL (ref 82.0–453.0)

## 2012-01-03 MED ORDER — diphenhydrAMINE (BENADRYL) capsule 25 mg
25 | Freq: Once | ORAL | Status: AC
Start: 2012-01-03 — End: 2012-01-03
  Administered 2012-01-03: 18:00:00 via ORAL

## 2012-01-03 MED ORDER — immune globulin (human) (IgG) (GAMMAGARD) Soln 25 g
10 | Freq: Once | INTRAVENOUS | Status: AC
Start: 2012-01-03 — End: 2012-01-03
  Administered 2012-01-03: 18:00:00 via INTRAVENOUS

## 2012-01-03 MED ORDER — sodium chloride 0.9 % infusion
Freq: Every day | INTRAVENOUS | Status: AC | PRN
Start: 2012-01-03 — End: 2012-01-03

## 2012-01-03 MED ORDER — albuterol (PROVENTIL HFA;VENTOLIN HFA) inhaler 1-2 puff
90 | RESPIRATORY_TRACT | Status: AC | PRN
Start: 2012-01-03 — End: 2012-01-03

## 2012-01-03 MED ORDER — acetaminophen (TYLENOL) tablet 650 mg
325 | Freq: Once | ORAL | Status: AC
Start: 2012-01-03 — End: 2012-01-03
  Administered 2012-01-03: 18:00:00 via ORAL

## 2012-01-03 MED ORDER — heparin lock flush 100 unit/mL Syrg 500 Units
100 | Freq: Every day | INTRAVENOUS | Status: AC | PRN
Start: 2012-01-03 — End: 2012-01-03

## 2012-01-03 MED ORDER — sodium chloride 0.9 % flush 10 mL
Freq: Every day | INTRAMUSCULAR | Status: AC | PRN
Start: 2012-01-03 — End: 2012-01-03

## 2012-01-03 MED ORDER — diphenhydrAMINE (BENADRYL) injection 25 mg
50 | Freq: Every day | INTRAMUSCULAR | Status: AC | PRN
Start: 2012-01-03 — End: 2012-01-03

## 2012-01-03 MED ORDER — EPINEPHrine (ADRENALIN) injection 0.3 mg
1 | Freq: Every day | INTRAMUSCULAR | Status: AC | PRN
Start: 2012-01-03 — End: 2012-01-03

## 2012-01-03 MED ORDER — hydrocortisone sod succ (PF) (SOLU-CORTEF) 100 mg/2 mL injection SolR 100 mg
100 | Freq: Every day | INTRAMUSCULAR | Status: AC | PRN
Start: 2012-01-03 — End: 2012-01-03

## 2012-01-03 MED ORDER — metroNIDAZOLE (FLAGYL) 250 MG tablet
250 | ORAL_TABLET | Freq: Three times a day (TID) | ORAL | Status: AC
Start: 2012-01-03 — End: 2012-06-30

## 2012-01-03 MED FILL — TYLENOL 325 MG TABLET: 325 325 mg | ORAL | Qty: 2

## 2012-01-03 MED FILL — DIPHENHYDRAMINE 25 MG CAPSULE: 25 25 mg | ORAL | Qty: 1

## 2012-01-03 MED FILL — GAMMAGARD LIQUID 10 % INJECTION SOLUTION: 10 10 % | INTRAMUSCULAR | Qty: 250

## 2012-01-03 NOTE — Unmapped (Signed)
See dictated note

## 2012-01-03 NOTE — Unmapped (Signed)
Here for IgG, doing well, no problems, see Hemo/Onc clinic note for medication/allergy review, chemotherapy consent verified, see flowsheets, tol well, pt d/c'd amb

## 2012-01-04 NOTE — Unmapped (Signed)
DICTATED ADDENDUM.  PROVIDER TO REVIEW AND SIGN    DIAGNOSIS(ES):  History of breast cancer stage I, history non-Hodgkin's lymphoma.      [name] is here for follow-up.  Overall she is doing very well.  We stopped Rituxan in several months ago.  She has had no other major real problems. Her scans all looked quite good.  She is planning to spend most of the winter in [place].  She does still require IVIG.  I have told her that she should have her immunoglobulins checked in [place] in 2 to 3 months.  The further away she gets from Rituxan the less likely it is that she will probably need it.  However, today she is suffering from an upper respiratory infection.  She is pretty adamant she does not want to take antibiotics despite the fact that her phlegm and secretions are a little bit yellow.  She does not have documented fevers or chills.  I did tell her that she could at least try Flagyl which should not cause Clostridium difficile and she seems to be contented with that.  Remainder of a 10-point review of systems is otherwise unchanged.    PHYSICAL EXAMINATION:  Vital signs per flow sheet.  She is alert and oriented and otherwise in no acute distress.  She does sound a bit nasal today.  She is accompanied by her daughter.  Her throat is clear.  Hearing is grossly intact.  No cervical, supra, infraclavicular adenopathy.  Lungs are clear anteriorly and posteriorly.  Cardiac exam no murmurs, rubs or gallops.  Breast exam was not performed.  Abdomen is soft without ascites, jaundice or organomegaly.  There is no lower extremity edema.  She is not clubbed.  Skin shows erythematous plaque-like lesions on her arm secondary to her recent use of 5-FU on her skin.  There is no signs of ecchymoses.  Musculoskeletal, no active arthritis or fractures.  Neurologic, mental status alert and oriented.  Cranial nerves grossly intact.  Motor and sensory grossly intact.  Gait is unremarkable.  Psychiatric, no overt anxiety or  depression.  Laboratory tests all per flow sheet.    IMPRESSION:  Clinically stable, doing well.    PLAN:    1. Patient was given a prescription for Flagyl for her upper respiratory symptoms.  She is quite concerned about Clostridium difficile and I have told her this should be a safe antibiotic for her.    2. Patient will continue with IVIG.  3. She will have immunoglobulins assessed and possible continuation of IVIG while in [place].  She does have a med contact with Dr. Lyman Bishop.  4. Copies of blood work will be given.  5. She will contact us for additional issues.   6. Questions and other followup discussed.      EEL/tlm  [id number]

## 2012-02-14 NOTE — Unmapped (Signed)
Message copied by Lyndle Herrlich on Mon Feb 14, 2012 10:02 AM  ------       Message from: Malen Gauze       Created: Mon Feb 14, 2012  9:56 AM         Ashley Madden,              Patient called to make sure records are sent to Dr. Lenise Herald in [place] because she is seeing her this week. Please call patient and let her know how to obtain those records.              Phone # on header: [phone]              Thanks,       Ashley Madden  ------

## 2012-02-14 NOTE — Unmapped (Signed)
Ashley Madden is aware I don't send med records.  At [facility] all med rec request go through med rec dept at hospital.  She states records were requested. She wanted to know when they would be sent. I told her she would have to call the med rec dept. Patient given that phone number---bdyer

## 2012-06-16 NOTE — Unmapped (Signed)
Returned patients call in regards to needing an upcoming appointment with Dr. Samara Snide.  Advised patient she has an appointment already scheduled on 06/30/12 @ 9:30 am.  Patient agreed to appointment date and time.

## 2012-06-30 ENCOUNTER — Ambulatory Visit: Admit: 2012-06-30 | Payer: MEDICARE | Attending: Hematology & Oncology

## 2012-06-30 ENCOUNTER — Ambulatory Visit: Admit: 2012-06-30 | Payer: MEDICARE

## 2012-06-30 ENCOUNTER — Inpatient Hospital Stay: Admit: 2012-06-30 | Payer: MEDICARE | Attending: Hematology & Oncology

## 2012-06-30 DIAGNOSIS — D809 Immunodeficiency with predominantly antibody defects, unspecified: Secondary | ICD-10-CM

## 2012-06-30 DIAGNOSIS — J8410 Pulmonary fibrosis, unspecified: Secondary | ICD-10-CM

## 2012-06-30 DIAGNOSIS — C8589 Other specified types of non-Hodgkin lymphoma, extranodal and solid organ sites: Secondary | ICD-10-CM

## 2012-06-30 LAB — CBC
Hematocrit: 40.2 % (ref 35.0–45.0)
Hemoglobin: 13.8 g/dL (ref 11.7–15.5)
MCH: 29.5 pg (ref 27.0–33.0)
MCHC: 34.4 g/dL (ref 32.0–36.0)
MCV: 85.9 fL (ref 80.0–100.0)
MPV: 10.5 fL (ref 7.5–11.5)
Platelets: 122 10*3/uL (ref 140–400)
RBC: 4.68 10*6/uL (ref 3.80–5.10)
RDW: 16.4 % (ref 11.0–15.0)
WBC: 4.9 10*3/uL (ref 3.8–10.8)

## 2012-06-30 LAB — DIFFERENTIAL
Basophils Absolute: 15 /uL (ref 0–200)
Basophils Relative: 0.3 % (ref 0.0–1.0)
Eosinophils Absolute: 20 /uL (ref 15–500)
Eosinophils Relative: 0.4 % (ref 0.0–8.0)
Lymphocytes Absolute: 559 /uL (ref 850–3900)
Lymphocytes Relative: 11.4 % (ref 15.0–45.0)
Monocytes Absolute: 412 /uL (ref 200–950)
Monocytes Relative: 8.4 % (ref 0.0–12.0)
Neutrophils Absolute: 3896 /uL (ref 1500–7800)
Neutrophils Relative: 79.5 % (ref 40.0–80.0)

## 2012-06-30 LAB — COMPREHENSIVE METABOLIC PANEL
ALT: 36 U/L (ref 13–69)
AST: 34 U/L (ref 14–65)
Albumin: 4.4 g/dL (ref 3.6–5.1)
Alkaline Phosphatase: 72 U/L (ref 33–130)
Anion Gap: 12 mmol/L (ref 3–16)
BUN: 24 mg/dL (ref 7–25)
CO2: 29 mmol/L (ref 21–33)
Calcium: 9.5 mg/dL (ref 8.6–10.2)
Chloride: 105 mmol/L (ref 98–110)
Creatinine: 0.86 mg/dL (ref 0.50–1.20)
GFR MDRD Af Amer: 77 See note.
GFR MDRD Non Af Amer: 63 See note.
Glucose: 127 mg/dL (ref 65–99)
Osmolality, Calculated: 308 mOsm/kg (ref 278–305)
Potassium: 4.7 mmol/L (ref 3.5–5.3)
Sodium: 146 mmol/L (ref 135–146)
Total Bilirubin: 0.9 mg/dL (ref 0.2–1.2)
Total Protein: 7.2 g/dL (ref 6.2–8.3)

## 2012-06-30 LAB — IGM: IgM: 66.3 mg/dL (ref 46.0–304.0)

## 2012-06-30 LAB — KAPPA/LAMBDA, FREE LIGHT CHAINS, SERUM
Kappa/Lambda Ratio: 0.68 ratio (ref 0.26–1.65)
Kappa: 7.1 mg/L (ref 3.3–19.4)
Lambda: 10.4 mg/L (ref 5.7–26.3)

## 2012-06-30 LAB — IGA: IgA: 62.1 mg/dL — ABNORMAL LOW (ref 82.0–453.0)

## 2012-06-30 LAB — IGG: IgG: 794 mg/dL (ref 751.0–1560.0)

## 2012-06-30 NOTE — Unmapped (Signed)
See dictated note

## 2012-06-30 NOTE — Unmapped (Signed)
No show for appt. Seen by Dr Lower.

## 2012-06-30 NOTE — Unmapped (Signed)
Patient called and requested appointment rescheduled from 08/11/12 to 08/04/12.  Patient has been rescheduled and agrees to updated appointment date and time.

## 2012-07-03 NOTE — Unmapped (Signed)
Patient called to see if she needs immunoglobulins based on her blood work results from Friday--bdyer

## 2012-07-03 NOTE — Unmapped (Signed)
Pt notified of IgG level from 06/30/12 was [phone]. Per Dr. Samara Snide no IVIG transfusion is needed this month. Pt instructed to come back in one month for a transfusion. Pt stated understanding and someone from our office will call to make an appointment.

## 2012-07-03 NOTE — Unmapped (Signed)
Called and advised patient that her f/u appointment with Dr. Samara Snide is scheduled for 08/04/12 @ 9:30 am.  Patient agreed to appointment date and time.

## 2012-07-04 NOTE — Unmapped (Signed)
DICTATED ADDENDUM.  PROVIDER TO REVIEW AND SIGN    DIAGNOSIS(ES):  Non-Hodgkin's lymphoma, breast cancer.      Ashley Madden is here for follow-up.  She has been in [place] for about the last 6 months.  Overall she is doing well.  She is not had any Rituxan since last October and I really do not feel that she needs to resume this unless there is a specific reason.  She did continue with IVIG about every 5 to 6 weeks.  She actually is doing much better on the IVIG.  In fact she has had many, many fewer issues with infection, particularly her chronic sinusitis.  She feels fairly well today.  Her main complaint centers around fatigue.  She just feels tired.  No GI, GU complaints.  She was able to help redecorate one of their houses of [place] which she and her husband very much enjoyed.      Remainder of a 10-point review of systems is unchanged.    PHYSICAL EXAMINATION:  Vital signs per flow sheet.  She is alert and oriented and otherwise in no acute distress.  Her throat is clear.  Hearing is grossly intact.  No cervical, supra, infraclavicular adenopathy.  Lungs are clear.  Cardiac exam- no murmurs, rubs or gallops.  Chest wall is negative.  Abdomen is soft without ascites, jaundice or organomegaly.  There is no lower extremity edema.  She is not clubbed.  Skin is without rash or ecchymoses.  Musculoskeletal- no active arthritis or fractures.  Neurologic- mental status alert and oriented.  Cranial nerves grossly intact.  Motor and sensory grossly intact.  Gait is unremarkable.  Psychiatric- no overt anxiety or depression.      Laboratory tests all per flow sheet.    IMPRESSION:  Clinically doing well.    PLAN:    1. Will check immunoglobulin levels today to see if she still requires the IVIG.  She really feels that it has made a big difference clinically.  2. Copies of blood work given.    3. We did discuss her other issues regarding staging.  She will need to be set up for repeat CAT scan probably in August.  She and  her husband have an [place] safari and she wants to do it beforehand.    4. She will contact us for additional issues and return as noted.    EEL/mjs  [id number]

## 2012-07-06 NOTE — Unmapped (Signed)
Called and notified patient that she has been scheduled for her Chest CT on 08/18/12 @ 10:00 am, with an arrival time of 9:45 am.  Patient agreed to appointment date and time.

## 2012-08-04 ENCOUNTER — Ambulatory Visit: Payer: MEDICARE | Attending: Hematology & Oncology

## 2012-08-04 ENCOUNTER — Ambulatory Visit: Payer: MEDICARE

## 2012-08-04 NOTE — Unmapped (Signed)
Pt no show, Dr. Samara Snide notified.

## 2012-08-07 ENCOUNTER — Ambulatory Visit: Admit: 2012-08-07 | Payer: MEDICARE | Attending: Hematology & Oncology

## 2012-08-07 ENCOUNTER — Ambulatory Visit: Admit: 2012-08-07 | Payer: MEDICARE

## 2012-08-07 ENCOUNTER — Inpatient Hospital Stay: Admit: 2012-08-07 | Payer: MEDICARE | Attending: Hematology & Oncology

## 2012-08-07 DIAGNOSIS — D809 Immunodeficiency with predominantly antibody defects, unspecified: Secondary | ICD-10-CM

## 2012-08-07 DIAGNOSIS — C50919 Malignant neoplasm of unspecified site of unspecified female breast: Secondary | ICD-10-CM

## 2012-08-07 LAB — CBC
Hematocrit: 39 % (ref 35.0–45.0)
Hemoglobin: 13.5 g/dL (ref 11.7–15.5)
MCH: 29.8 pg (ref 27.0–33.0)
MCHC: 34.6 g/dL (ref 32.0–36.0)
MCV: 86.1 fL (ref 80.0–100.0)
MPV: 10.4 fL (ref 7.5–11.5)
Platelets: 124 10E3/uL — ABNORMAL LOW (ref 140–400)
RBC: 4.53 10E6/uL (ref 3.80–5.10)
RDW: 15.1 % — ABNORMAL HIGH (ref 11.0–15.0)
WBC: 5.4 10E3/uL (ref 3.8–10.8)

## 2012-08-07 LAB — DIFFERENTIAL
Basophils Absolute: 27 /uL (ref 0–200)
Basophils Relative: 0.5 % (ref 0.0–1.0)
Eosinophils Absolute: 43 /uL (ref 15–500)
Eosinophils Relative: 0.8 % (ref 0.0–8.0)
Lymphocytes Absolute: 653 /uL — ABNORMAL LOW (ref 850–3900)
Lymphocytes Relative: 12.1 % — ABNORMAL LOW (ref 15.0–45.0)
Monocytes Absolute: 643 /uL (ref 200–950)
Monocytes Relative: 11.9 % (ref 0.0–12.0)
Neutrophils Absolute: 4034 /uL (ref 1500–7800)
Neutrophils Relative: 74.7 % (ref 40.0–80.0)

## 2012-08-07 LAB — COMPREHENSIVE METABOLIC PANEL
ALT: 41 U/L (ref 13–69)
AST: 42 U/L (ref 14–65)
Albumin: 4.3 g/dL (ref 3.6–5.1)
Alkaline Phosphatase: 92 U/L (ref 33–130)
Anion Gap: 11 mmol/L (ref 3–16)
BUN: 20 mg/dL (ref 7–25)
CO2: 29 mmol/L (ref 21–33)
Calcium: 9.3 mg/dL (ref 8.6–10.2)
Chloride: 100 mmol/L (ref 98–110)
Creatinine: 0.71 mg/dL (ref 0.50–1.20)
GFR MDRD Af Amer: 96 See note.
GFR MDRD Non Af Amer: 79 See note.
Glucose: 88 mg/dL (ref 65–99)
Osmolality, Calculated: 292 mOsm/kg (ref 278–305)
Potassium: 4.6 mmol/L (ref 3.5–5.3)
Sodium: 140 mmol/L (ref 135–146)
Total Bilirubin: 0.8 mg/dL (ref 0.2–1.2)
Total Protein: 6.8 g/dL (ref 6.2–8.3)

## 2012-08-07 LAB — IGA: IgA: 60.3 mg/dL (ref 82.0–453.0)

## 2012-08-07 LAB — IGG: IgG: 670 mg/dL (ref 751.0–1560.0)

## 2012-08-07 LAB — KAPPA/LAMBDA, FREE LIGHT CHAINS, SERUM
Kappa/Lambda Ratio: 0.88 ratio (ref 0.26–1.65)
Kappa: 8.5 mg/L (ref 3.3–19.4)
Lambda: 9.7 mg/L (ref 5.7–26.3)

## 2012-08-07 LAB — IGM: IgM: 64.8 mg/dL (ref 46.0–304.0)

## 2012-08-07 MED ORDER — immune globulin (human) (IgG) (GAMMAGARD) Soln 25 g
10 | Freq: Once | INTRAVENOUS | Status: AC
Start: 2012-08-07 — End: 2012-08-07
  Administered 2012-08-07: 19:00:00 via INTRAVENOUS

## 2012-08-07 MED ORDER — sodium chloride 0.9 % infusion
Freq: Every day | INTRAVENOUS | Status: AC | PRN
Start: 2012-08-07 — End: 2012-08-07

## 2012-08-07 MED ORDER — sodium chloride 0.9 % flush 10 mL
Freq: Every day | INTRAMUSCULAR | Status: AC | PRN
Start: 2012-08-07 — End: 2012-08-07

## 2012-08-07 MED ORDER — hydrocortisone sod succ (PF)(SOLU-CORTEF) 100 mg injection
100 | Freq: Every day | INTRAMUSCULAR | Status: AC | PRN
Start: 2012-08-07 — End: 2012-08-07

## 2012-08-07 MED ORDER — diphenhydrAMINE (BENADRYL) injection 25 mg
50 | Freq: Every day | INTRAMUSCULAR | Status: AC | PRN
Start: 2012-08-07 — End: 2012-08-07

## 2012-08-07 MED ORDER — diphenhydrAMINE (BENADRYL) capsule 25 mg
25 | Freq: Once | ORAL | Status: AC
Start: 2012-08-07 — End: 2012-08-07
  Administered 2012-08-07: 18:00:00 via ORAL

## 2012-08-07 MED ORDER — heparin lock flush 100 unit/mL Syrg 500 Units
100 | Freq: Every day | INTRAVENOUS | Status: AC | PRN
Start: 2012-08-07 — End: 2012-08-07

## 2012-08-07 MED ORDER — albuterol (PROVENTIL HFA;VENTOLIN HFA) inhaler 1-2 puff
90 | RESPIRATORY_TRACT | Status: AC | PRN
Start: 2012-08-07 — End: 2012-08-07

## 2012-08-07 MED ORDER — EPINEPHrine (ADRENALIN) injection 0.3 mg
1 | Freq: Every day | INTRAMUSCULAR | Status: AC | PRN
Start: 2012-08-07 — End: 2012-08-07

## 2012-08-07 MED ORDER — acetaminophen (TYLENOL) tablet 650 mg
325 | Freq: Once | ORAL | Status: AC
Start: 2012-08-07 — End: 2012-08-07
  Administered 2012-08-07: 18:00:00 via ORAL

## 2012-08-07 MED FILL — DIPHENHYDRAMINE 25 MG CAPSULE: 25 25 mg | ORAL | Qty: 1

## 2012-08-07 MED FILL — TYLENOL 325 MG TABLET: 325 325 mg | ORAL | Qty: 2

## 2012-08-07 MED FILL — GAMMAGARD LIQUID 10 % INJECTION SOLUTION: 10 10 % | INTRAMUSCULAR | Qty: 250

## 2012-08-07 NOTE — Unmapped (Signed)
Here for IgG, doing well, no c/o's, see flowsheets, tol well, pt d/ced amb

## 2012-08-07 NOTE — Unmapped (Signed)
See dictated ntoe

## 2012-08-09 NOTE — Unmapped (Signed)
DICTATED ADDENDUM.  PROVIDER TO REVIEW AND SIGN     DIAGNOSIS(ES):  Non-Hodgkin's lymphoma, breast cancer, IgG deficiency.      Ashley Madden is here for continuation of IVIG.  Overall she is doing pretty well today.  She has had a little bit of loose stool.  She does not think she can give a stool specimen today but if she can we will certainly send it as she has had a history of C.  Diff in the past.  She is not having fevers, chills or abdominal pain.  She has no other signs of infection.  No fevers.  She is due to go to [place] for about a 2-week vacation in August and she would like to be treated prior to going as that really has helped her to keep her health up.  She is due for mammogram in August which I will also order for her.  In general, she is otherwise doing pretty well.  No new GI, GU, pulmonary or other major complaints.      Remainder of a 10-point review of systems is unchanged.    PHYSICAL EXAMINATION:  Vital signs per flow sheet.  She is alert, oriented and otherwise in no acute distress.  Her throat is clear.  Hearing is grossly intact.  No cervical, supra, infraclavicular adenopathy.  Lungs are clear.  Cardiac exam- no murmurs, rubs or gallops.  Abdomen is soft.  She has active bowel sounds.  No ascites, jaundice or organomegaly.  No lower extremity edema.  She is not clubbed.  Skin is without rash.  She has a lot of desquamative change but no signs of bruising.  Neurologic- mental status alert and oriented.  Cranial nerves grossly intact.  Motor and sensory grossly intact.  Gait is unremarkable. Psychiatric- no overt anxiety or depression.      Laboratory tests all per flow sheet.    IMPRESSION:  Clinically doing well.    PLAN:  1.  Patient will continue with current regimen.  2.  Copies of blood work given.  3.  She will continue IVIG.  4.  She will give stool specimen if possible.  She may also need to go back to see her gastroenterologist, Dr. Philis Nettle, if symptoms persist.  5.  Mammogram ordered.   She will return as directed.    EEL/wls  [id number]

## 2012-08-11 ENCOUNTER — Encounter: Payer: MEDICARE | Attending: Hematology & Oncology

## 2012-08-18 ENCOUNTER — Inpatient Hospital Stay: Admit: 2012-08-18 | Payer: MEDICARE | Attending: Hematology & Oncology

## 2012-08-18 DIAGNOSIS — J8410 Pulmonary fibrosis, unspecified: Secondary | ICD-10-CM

## 2012-08-21 NOTE — Unmapped (Signed)
Patient notified of CT Chest WO contrast from 08/18/12. Patient stated understanding.

## 2012-08-21 NOTE — Unmapped (Signed)
Message copied by Waldo Laine on Mon Aug 21, 2012 11:28 AM  ------       Message from: LOWER, Leanord Hawking       Created: Fri Aug 18, 2012  9:25 PM         Notify -- no change       ----- Message -----          From: Rad Results In Interface          Sent: 08/18/2012   5:46 PM            To: [name], MD                       ------

## 2012-08-25 ENCOUNTER — Inpatient Hospital Stay: Admit: 2012-08-25 | Payer: MEDICARE | Attending: Hematology & Oncology

## 2012-08-25 ENCOUNTER — Ambulatory Visit: Admit: 2012-08-25 | Discharge: 2012-08-25 | Payer: MEDICARE

## 2012-08-25 ENCOUNTER — Ambulatory Visit: Admit: 2012-08-25 | Payer: MEDICARE | Attending: Hematology & Oncology

## 2012-08-25 DIAGNOSIS — C50919 Malignant neoplasm of unspecified site of unspecified female breast: Secondary | ICD-10-CM

## 2012-08-25 DIAGNOSIS — D809 Immunodeficiency with predominantly antibody defects, unspecified: Secondary | ICD-10-CM

## 2012-08-25 DIAGNOSIS — D803 Selective deficiency of immunoglobulin G [IgG] subclasses: Secondary | ICD-10-CM

## 2012-08-25 LAB — COMPREHENSIVE METABOLIC PANEL
ALT: 23 U/L (ref 13–69)
AST: 29 U/L (ref 14–65)
Albumin: 4.3 g/dL (ref 3.6–5.1)
Alkaline Phosphatase: 75 U/L (ref 33–130)
Anion Gap: 10 mmol/L (ref 3–16)
BUN: 17 mg/dL (ref 7–25)
CO2: 30 mmol/L (ref 21–33)
Calcium: 9.2 mg/dL (ref 8.6–10.2)
Chloride: 103 mmol/L (ref 98–110)
Creatinine: 0.69 mg/dL (ref 0.50–1.20)
GFR MDRD Af Amer: 99 See note.
GFR MDRD Non Af Amer: 82 See note.
Glucose: 138 mg/dL (ref 65–99)
Osmolality, Calculated: 300 mOsm/kg (ref 278–305)
Potassium: 3.9 mmol/L (ref 3.5–5.3)
Sodium: 143 mmol/L (ref 135–146)
Total Bilirubin: 0.9 mg/dL (ref 0.2–1.2)
Total Protein: 6.8 g/dL (ref 6.2–8.3)

## 2012-08-25 LAB — CBC
Hematocrit: 39.2 % (ref 35.0–45.0)
Hemoglobin: 13.3 g/dL (ref 11.7–15.5)
MCH: 29.3 pg (ref 27.0–33.0)
MCHC: 33.9 g/dL (ref 32.0–36.0)
MCV: 86.3 fL (ref 80.0–100.0)
MPV: 11 fL (ref 7.5–11.5)
Platelets: 111 10*3/uL — ABNORMAL LOW (ref 140–400)
RBC: 4.55 10*6/uL (ref 3.80–5.10)
RDW: 14.9 % (ref 11.0–15.0)
WBC: 4.2 10*3/uL (ref 3.8–10.8)

## 2012-08-25 MED ORDER — diphenhydrAMINE (BENADRYL) capsule 25 mg
25 | Freq: Once | ORAL | Status: AC
Start: 2012-08-25 — End: 2012-08-25
  Administered 2012-08-25: 15:00:00 via ORAL

## 2012-08-25 MED ORDER — heparin lock flush 100 unit/mL Syrg 500 Units
100 | Freq: Every day | INTRAVENOUS | Status: AC | PRN
Start: 2012-08-25 — End: 2012-08-25

## 2012-08-25 MED ORDER — sodium chloride 0.9 % infusion
Freq: Every day | INTRAVENOUS | Status: AC | PRN
Start: 2012-08-25 — End: 2012-08-25
  Administered 2012-08-25: 15:00:00 via INTRAVENOUS

## 2012-08-25 MED ORDER — EPINEPHrine (ADRENALIN) injection 0.3 mg
1 | Freq: Every day | INTRAMUSCULAR | Status: AC | PRN
Start: 2012-08-25 — End: 2012-08-25

## 2012-08-25 MED ORDER — diphenhydrAMINE (BENADRYL) injection 25 mg
50 | Freq: Every day | INTRAMUSCULAR | Status: AC | PRN
Start: 2012-08-25 — End: 2012-08-25

## 2012-08-25 MED ORDER — hydrocortisone sod succ (PF)(SOLU-CORTEF) 100 mg injection
100 | Freq: Every day | INTRAMUSCULAR | Status: AC | PRN
Start: 2012-08-25 — End: 2012-08-25

## 2012-08-25 MED ORDER — sodium chloride 0.9 % flush 10 mL
Freq: Every day | INTRAMUSCULAR | Status: AC | PRN
Start: 2012-08-25 — End: 2012-08-25

## 2012-08-25 MED ORDER — albuterol (PROVENTIL HFA;VENTOLIN HFA) inhaler 1-2 puff
90 | RESPIRATORY_TRACT | Status: AC | PRN
Start: 2012-08-25 — End: 2012-08-25

## 2012-08-25 MED ORDER — immune globulin (human) (IgG) (GAMMAGARD) Soln 25 g
10 | Freq: Once | INTRAVENOUS | Status: AC
Start: 2012-08-25 — End: 2012-08-25
  Administered 2012-08-25: 15:00:00 via INTRAVENOUS

## 2012-08-25 MED ORDER — acetaminophen (TYLENOL) tablet 650 mg
325 | Freq: Once | ORAL | Status: AC
Start: 2012-08-25 — End: 2012-08-25
  Administered 2012-08-25: 15:00:00 via ORAL

## 2012-08-25 MED FILL — DIPHENHYDRAMINE 25 MG CAPSULE: 25 25 mg | ORAL | Qty: 1

## 2012-08-25 MED FILL — TYLENOL 325 MG TABLET: 325 325 mg | ORAL | Qty: 2

## 2012-08-25 MED FILL — GAMMAGARD LIQUID 10 % INJECTION SOLUTION: 10 10 % | INTRAMUSCULAR | Qty: 250

## 2012-08-25 NOTE — Unmapped (Signed)
Pt here for her IVIG infusion.  See clinic note for v/s, med update and pain assessment.  Pt tolerated treatment well.  PIV discontinued, bandaide applied.  Left ambulatory.

## 2012-08-25 NOTE — Unmapped (Signed)
See dictated note

## 2012-08-29 NOTE — Unmapped (Signed)
DICTATED ADDENDUM.  PROVIDER TO REVIEW AND SIGN    DIAGNOSIS(ES):  History non-Hodgkin's lymphoma, history of breast cancer, history of IgG deficiency.      [name] is here for continuation of treatment with IVIG.  She is doing actually very well.  She and her husband have a several week vacation trip planned to [place] and will be leaving tomorrow.  She has had no intercurrent infections recently but she does certainly get a tendency and we are trying to keep her IgG levels appropriate.      Remainder of a 10-point review of systems is otherwise unchanged.    PHYSICAL EXAMINATION:  Vital signs per flow sheet.  She is alert, oriented and otherwise in no acute distress.  Her throat is clear.  Hearing is grossly intact.  No cervical, supra, infraclavicular adenopathy.  Lungs are clear.  Cardiac exam is negative.  Abdomen is soft without ascites, jaundice or organomegaly.  There is no lower extremity edema.  She is not clubbed.  Skin is without rash or ecchymoses.  Musculoskeletal- no active arthritis or signs of fracture.  Neurologic- mental status alert and oriented.  Cranial nerves grossly intact.  Motor and sensory grossly intact.  Gait is unremarkable.  Psychiatric- no overt anxiety or depression.      Laboratory tests all per flow sheet.    IMPRESSION:  Clinically doing well.    PLAN:    1. Patient will continue with current IVIG.   2. Copies of blood work given.    3. She will contact us for additional issues.  4. Survivorship issues discussed.    EEL/tmg   [id number]

## 2012-09-29 ENCOUNTER — Inpatient Hospital Stay: Admit: 2012-09-29 | Payer: MEDICARE | Attending: Hematology & Oncology

## 2012-09-29 ENCOUNTER — Ambulatory Visit: Admit: 2012-09-29 | Discharge: 2012-09-29 | Payer: MEDICARE

## 2012-09-29 ENCOUNTER — Ambulatory Visit: Admit: 2012-09-29 | Payer: MEDICARE | Attending: Hematology & Oncology

## 2012-09-29 DIAGNOSIS — D809 Immunodeficiency with predominantly antibody defects, unspecified: Secondary | ICD-10-CM

## 2012-09-29 DIAGNOSIS — C50919 Malignant neoplasm of unspecified site of unspecified female breast: Secondary | ICD-10-CM

## 2012-09-29 LAB — COMPREHENSIVE METABOLIC PANEL
ALT: 38 U/L (ref 13–69)
AST: 37 U/L (ref 14–65)
Albumin: 4.2 g/dL (ref 3.6–5.1)
Alkaline Phosphatase: 82 U/L (ref 33–130)
Anion Gap: 10 mmol/L (ref 3–16)
BUN: 21 mg/dL (ref 7–25)
CO2: 31 mmol/L (ref 21–33)
Calcium: 10 mg/dL (ref 8.6–10.2)
Chloride: 100 mmol/L (ref 98–110)
Creatinine: 0.7 mg/dL (ref 0.50–1.20)
GFR MDRD Af Amer: 97 See note.
GFR MDRD Non Af Amer: 81 See note.
Glucose: 74 mg/dL (ref 65–99)
Osmolality, Calculated: 294 mOsm/kg (ref 278–305)
Potassium: 4 mmol/L (ref 3.5–5.3)
Sodium: 141 mmol/L (ref 135–146)
Total Bilirubin: 0.7 mg/dL (ref 0.2–1.2)
Total Protein: 6.9 g/dL (ref 6.2–8.3)

## 2012-09-29 LAB — DIFFERENTIAL
Basophils Absolute: 5 /uL (ref 0–200)
Basophils Relative: 0.1 % (ref 0.0–1.0)
Eosinophils Absolute: 55 /uL (ref 15–500)
Eosinophils Relative: 1.1 % (ref 0.0–8.0)
Lymphocytes Absolute: 590 /uL (ref 850–3900)
Lymphocytes Relative: 11.8 % (ref 15.0–45.0)
Monocytes Absolute: 535 /uL (ref 200–950)
Monocytes Relative: 10.7 % (ref 0.0–12.0)
Neutrophils Absolute: 3815 /uL (ref 1500–7800)
Neutrophils Relative: 76.3 % (ref 40.0–80.0)

## 2012-09-29 LAB — CBC
Hematocrit: 38.8 % (ref 35.0–45.0)
Hemoglobin: 13.4 g/dL (ref 11.7–15.5)
MCH: 29.5 pg (ref 27.0–33.0)
MCHC: 34.5 g/dL (ref 32.0–36.0)
MCV: 85.3 fL (ref 80.0–100.0)
MPV: 10.3 fL (ref 7.5–11.5)
Platelets: 104 10*3/uL (ref 140–400)
RBC: 4.55 10*6/uL (ref 3.80–5.10)
RDW: 14.4 % (ref 11.0–15.0)
WBC: 5 10*3/uL (ref 3.8–10.8)

## 2012-09-29 MED ORDER — immune globulin (human) (IgG) (GAMMAGARD) Soln 25 g
10 | Freq: Once | INTRAVENOUS | Status: AC
Start: 2012-09-29 — End: 2012-09-29
  Administered 2012-09-29: 15:00:00 via INTRAVENOUS

## 2012-09-29 MED ORDER — heparin lock flush 100 unit/mL Syrg 500 Units
100 | Freq: Every day | INTRAVENOUS | Status: AC | PRN
Start: 2012-09-29 — End: 2012-09-29

## 2012-09-29 MED ORDER — sodium chloride 0.9 % flush 10 mL
Freq: Every day | INTRAMUSCULAR | Status: AC | PRN
Start: 2012-09-29 — End: 2012-09-29

## 2012-09-29 MED ORDER — EPINEPHrine (ADRENALIN) injection 0.3 mg
1 | Freq: Every day | INTRAMUSCULAR | Status: AC | PRN
Start: 2012-09-29 — End: 2012-09-29

## 2012-09-29 MED ORDER — diphenhydrAMINE (BENADRYL) injection 25 mg
50 | Freq: Every day | INTRAMUSCULAR | Status: AC | PRN
Start: 2012-09-29 — End: 2012-09-29

## 2012-09-29 MED ORDER — albuterol (PROVENTIL HFA;VENTOLIN HFA) inhaler 1-2 puff
90 | RESPIRATORY_TRACT | Status: AC | PRN
Start: 2012-09-29 — End: 2012-09-29

## 2012-09-29 MED ORDER — hydrocortisone sod succ (PF)(SOLU-CORTEF) 100 mg injection
100 | Freq: Every day | INTRAMUSCULAR | Status: AC | PRN
Start: 2012-09-29 — End: 2012-09-29

## 2012-09-29 MED ORDER — acetaminophen (TYLENOL) tablet 650 mg
325 | Freq: Once | ORAL | Status: AC
Start: 2012-09-29 — End: 2012-09-29
  Administered 2012-09-29: 15:00:00 via ORAL

## 2012-09-29 MED ORDER — diphenhydrAMINE (BENADRYL) capsule 25 mg
25 | Freq: Once | ORAL | Status: AC
Start: 2012-09-29 — End: 2012-09-29
  Administered 2012-09-29: 15:00:00 via ORAL

## 2012-09-29 MED ORDER — sodium chloride 0.9 % infusion
Freq: Every day | INTRAVENOUS | Status: AC | PRN
Start: 2012-09-29 — End: 2012-09-29
  Administered 2012-09-29: 15:00:00 via INTRAVENOUS

## 2012-09-29 MED FILL — TYLENOL 325 MG TABLET: 325 325 mg | ORAL | Qty: 2

## 2012-09-29 MED FILL — DIPHENHYDRAMINE 25 MG CAPSULE: 25 25 mg | ORAL | Qty: 1

## 2012-09-29 MED FILL — GAMMAGARD LIQUID 10 % INJECTION SOLUTION: 10 10 % | INTRAMUSCULAR | Qty: 250

## 2012-09-29 NOTE — Unmapped (Signed)
See dictated note

## 2012-09-29 NOTE — Unmapped (Signed)
Pt here for IVIG. See MD visit note for Vitals, Allergy and Medication review.  No crash cart available on site in [facility], Manager  aware and approves treatment on site.  Pt tolerated treatment without difficulty. Discharged ambulatory, given AVS.

## 2012-10-03 NOTE — Unmapped (Signed)
DICTATED ADDENDUM.  PROVIDER TO REVIEW AND SIGN     DIAGNOSIS(ES):  History of breast cancer, non-Hodgkin's lymphoma, IgG deficiency.      [name] is here for continuation of IVIG.  She has been receiving this every 4 to 6 weeks.  Overall she is doing pretty well today.  She just returned from a vacation trip to [place] including [place], which she thoroughly enjoyed.  She has had no increased coughing, shortness of breath, GI, GU, pulmonary or other major complaints and for the most part feels that things are going along fairly well.      Remainder of a 10-point review of systems is unchanged.    PHYSICAL EXAMINATION:  Vital signs per flow sheet.  She is alert, oriented and otherwise in no acute distress.  Throat is clear.  Hearing is grossly intact.  No cervical, supra, infraclavicular adenopathy.  Lungs are clear.  Cardiac exam- no murmurs, rubs or gallops.  Breast area was not examined.  Abdomen is soft without ascites, jaundice or organomegaly.  There is no lower extremity edema.  She is not clubbed.  Skin is without rash.  She does have some solar damage, which is pretty much unchanged.  Neurologic- mental status alert and oriented.  Cranial nerves grossly intact.  Motor and sensory grossly intact.  Gait is unremarkable.  Psychiatric- no overt anxiety or depression.      Laboratory tests all per flow sheet.    IMPRESSION:  Stable.    PLAN:    1. Patient will continue with IVIG.  2. Copies of blood work given.  3. She will return as directed and will contact us for additional issues.    4. We also ordered mammogram, which is due.    5. She will contact us for additional problems.    EEL/smj   [id number]

## 2012-10-05 NOTE — Unmapped (Signed)
Called and left a message advising patient that she has been scheduled for her screening mammogram on 10/16/12 @ 8:45 am.  Patient agreed to appointment date and time.

## 2012-10-11 ENCOUNTER — Inpatient Hospital Stay: Admit: 2012-10-11

## 2012-10-13 MED ORDER — atenolol (TENORMIN) 50 MG tablet
50 | ORAL_TABLET | Freq: Every day | ORAL | 0.00 refills | 30.00000 days | Status: AC
Start: 2012-10-13 — End: 2014-06-26

## 2012-10-16 ENCOUNTER — Inpatient Hospital Stay: Admit: 2012-10-16 | Payer: MEDICARE

## 2012-10-16 ENCOUNTER — Ambulatory Visit: Payer: MEDICARE

## 2012-10-16 DIAGNOSIS — Z1231 Encounter for screening mammogram for malignant neoplasm of breast: Secondary | ICD-10-CM

## 2012-11-03 ENCOUNTER — Ambulatory Visit: Admit: 2012-11-03 | Discharge: 2012-11-03 | Payer: MEDICARE

## 2012-11-03 ENCOUNTER — Ambulatory Visit: Admit: 2012-11-03 | Payer: MEDICARE | Attending: Hematology & Oncology

## 2012-11-03 ENCOUNTER — Inpatient Hospital Stay: Admit: 2012-11-03 | Payer: MEDICARE

## 2012-11-03 DIAGNOSIS — C50919 Malignant neoplasm of unspecified site of unspecified female breast: Secondary | ICD-10-CM

## 2012-11-03 DIAGNOSIS — D809 Immunodeficiency with predominantly antibody defects, unspecified: Secondary | ICD-10-CM

## 2012-11-03 LAB — CBC
Hematocrit: 36.9 % (ref 35.0–45.0)
Hemoglobin: 12.5 g/dL (ref 11.7–15.5)
MCH: 29.6 pg (ref 27.0–33.0)
MCHC: 34 g/dL (ref 32.0–36.0)
MCV: 87.1 fL (ref 80.0–100.0)
MPV: 10.6 fL (ref 7.5–11.5)
Platelets: 122 10*3/uL (ref 140–400)
RBC: 4.23 10*6/uL (ref 3.80–5.10)
RDW: 14.7 % (ref 11.0–15.0)
WBC: 5 10*3/uL (ref 3.8–10.8)

## 2012-11-03 LAB — COMPREHENSIVE METABOLIC PANEL
ALT: 24 U/L (ref 13–69)
AST: 23 U/L (ref 14–65)
Albumin: 3.8 g/dL (ref 3.6–5.1)
Alkaline Phosphatase: 66 U/L (ref 33–130)
Anion Gap: 10 mmol/L (ref 3–16)
BUN: 23 mg/dL (ref 7–25)
CO2: 31 mmol/L (ref 21–33)
Calcium: 9.6 mg/dL (ref 8.6–10.2)
Chloride: 102 mmol/L (ref 98–110)
Creatinine: 0.9 mg/dL (ref 0.50–1.20)
GFR MDRD Af Amer: 73 See note.
GFR MDRD Non Af Amer: 60 See note.
Glucose: 74 mg/dL (ref 65–99)
Osmolality, Calculated: 298 mOsm/kg (ref 278–305)
Potassium: 4.4 mmol/L (ref 3.5–5.3)
Sodium: 143 mmol/L (ref 135–146)
Total Bilirubin: 0.6 mg/dL (ref 0.2–1.2)
Total Protein: 6.2 g/dL (ref 6.2–8.3)

## 2012-11-03 LAB — DIFFERENTIAL
Basophils Absolute: 10 /uL (ref 0–200)
Basophils Relative: 0.2 % (ref 0.0–1.0)
Eosinophils Absolute: 45 /uL (ref 15–500)
Eosinophils Relative: 0.9 % (ref 0.0–8.0)
Lymphocytes Absolute: 490 /uL — ABNORMAL LOW (ref 850–3900)
Lymphocytes Relative: 9.8 % — ABNORMAL LOW (ref 15.0–45.0)
Monocytes Absolute: 570 /uL (ref 200–950)
Monocytes Relative: 11.4 % (ref 0.0–12.0)
Neutrophils Absolute: 3885 /uL (ref 1500–7800)
Neutrophils Relative: 77.7 % (ref 40.0–80.0)

## 2012-11-03 LAB — IGA: IgA: 56.3 mg/dL (ref 82.0–453.0)

## 2012-11-03 LAB — KAPPA/LAMBDA, FREE LIGHT CHAINS, SERUM
Kappa/Lambda Ratio: 0.77 ratio (ref 0.26–1.65)
Kappa: 7.4 mg/L (ref 3.3–19.4)
Lambda: 9.6 mg/L (ref 5.7–26.3)

## 2012-11-03 LAB — IGG: IgG: 806 mg/dL (ref 751.0–1560.0)

## 2012-11-03 LAB — IGM: IgM: 74.4 mg/dL (ref 46.0–304.0)

## 2012-11-03 MED ORDER — diphenhydrAMINE (BENADRYL) injection 25 mg
50 | Freq: Every day | INTRAMUSCULAR | Status: AC | PRN
Start: 2012-11-03 — End: 2012-11-03

## 2012-11-03 MED ORDER — immune globulin (human) (IgG) (GAMMAGARD) Soln 25 g
10 | Freq: Once | INTRAVENOUS | Status: AC
Start: 2012-11-03 — End: 2012-11-03
  Administered 2012-11-03: 17:00:00 via INTRAVENOUS

## 2012-11-03 MED ORDER — diphenhydrAMINE (BENADRYL) capsule 25 mg
25 | Freq: Once | ORAL | Status: AC
Start: 2012-11-03 — End: 2012-11-03
  Administered 2012-11-03: 16:00:00 via ORAL

## 2012-11-03 MED ORDER — EPINEPHrine (ADRENALIN) injection 0.3 mg
1 | Freq: Every day | INTRAMUSCULAR | Status: AC | PRN
Start: 2012-11-03 — End: 2012-11-03

## 2012-11-03 MED ORDER — sodium chloride 0.9 % infusion
Freq: Every day | INTRAVENOUS | Status: AC | PRN
Start: 2012-11-03 — End: 2012-11-03
  Administered 2012-11-03: 16:00:00 via INTRAVENOUS

## 2012-11-03 MED ORDER — albuterol (PROVENTIL HFA;VENTOLIN HFA) inhaler 1-2 puff
90 | RESPIRATORY_TRACT | Status: AC | PRN
Start: 2012-11-03 — End: 2012-11-03

## 2012-11-03 MED ORDER — acetaminophen (TYLENOL) tablet 650 mg
325 | Freq: Once | ORAL | Status: AC
Start: 2012-11-03 — End: 2012-11-03
  Administered 2012-11-03: 16:00:00 via ORAL

## 2012-11-03 MED ORDER — heparin lock flush 100 unit/mL Syrg 500 Units
100 | Freq: Every day | INTRAVENOUS | Status: AC | PRN
Start: 2012-11-03 — End: 2012-11-03

## 2012-11-03 MED ORDER — amoxicillin-clavulanate (AUGMENTIN) 875-125 mg per tablet
875-125 | ORAL_TABLET | Freq: Two times a day (BID) | ORAL | Status: AC
Start: 2012-11-03 — End: 2014-06-26

## 2012-11-03 MED ORDER — sodium chloride 0.9 % flush 10 mL
Freq: Every day | INTRAMUSCULAR | Status: AC | PRN
Start: 2012-11-03 — End: 2012-11-03

## 2012-11-03 MED ORDER — hydrocortisone sod succ (PF)(SOLU-CORTEF) 100 mg injection
100 | Freq: Every day | INTRAMUSCULAR | Status: AC | PRN
Start: 2012-11-03 — End: 2012-11-03

## 2012-11-03 MED ORDER — atenolol (TENORMIN) 50 MG tablet
50 | ORAL_TABLET | Freq: Every day | ORAL | 0.00 refills | 30.00000 days | Status: AC
Start: 2012-11-03 — End: 2013-06-01

## 2012-11-03 MED FILL — GAMMAGARD LIQUID 10 % INJECTION SOLUTION: 10 10 % | INTRAMUSCULAR | Qty: 250

## 2012-11-03 MED FILL — TYLENOL 325 MG TABLET: 325 325 mg | ORAL | Qty: 2

## 2012-11-03 MED FILL — DIPHENHYDRAMINE 25 MG CAPSULE: 25 25 mg | ORAL | Qty: 1

## 2012-11-03 NOTE — Unmapped (Signed)
See dictated note

## 2012-11-03 NOTE — Unmapped (Signed)
Patient goes to [place], [place] for the winter and will see her oncologist there.  Patient states that she does this every winter and Dr. Samara Snide has approved for her records and lab orders to be sent to Dr. Ramon Dredge there.

## 2012-11-03 NOTE — Unmapped (Signed)
Arrived ambulatory after md appt.  See md note for assessment vital signs and update of allergies and medications.  Patient here for IVIG. Treatment plan reads to have crash cart readily available. Crash cart not available at office location.  Discussed with manager, Lupita Shutter RN, ok to proceed with treatment without crash cart.  Tolerated tx without difficulty.  Left ambulatory.

## 2012-11-07 NOTE — Unmapped (Signed)
DICTATED ADDENDUM.  PROVIDER TO REVIEW AND SIGN    DIAGNOSIS(ES):  Non-Hodgkin's lymphoma, breast cancer.      Ashley Madden is here for follow-up.  Overall she looks and feels really very well today.  She is having problems with superficial skin issues and I think she definitely needs her IVIG.  She does have a dermatologist who for the most part has been managing this and I have cautioned her that I'm going to place her on an antibiotic today but she needs to see the dermatologist.  She does not have fevers or chills.  She does not have a leukocytosis but the area is still reddened and may need debrided.  She has had no GI, GU complaints today.  Otherwise she has had no signs of infection.  She does know that she should get a flu shot.  She does have an upcoming vacation trip planned back to their home in [place].  She tolerates IVIG without problems.  She recently had mammogram which was unremarkable.      Remainder of a 10-point review of systems is unchanged.    PHYSICAL EXAMINATION:  Vital signs per flow sheet.  She is alert and oriented and otherwise in no acute distress.  Her throat is clear.  Hearing is grossly intact.  No cervical, supra, infraclavicular adenopathy.  Lungs are clear.  Cardiac exam- no murmurs, rubs or gallops.  Abdomen is soft without ascites, jaundice or organomegaly.  There is no lower extremity edema change.  Skin- she has a superficial lesion on her hand which seems to be healing fairly well.  This was a squamous cell carcinoma.  Right leg does show 2 areas related to prior trauma.  There is some redness.  No drainage.  She does have an eschar present.  Musculoskeletal- no active arthritis or fracture.  Neurologic- mental status alert and oriented.  Cranial nerves grossly intact.  Motor and sensory grossly intact.  Gait is unremarkable.  Psychiatric- no overt anxiety or depression.      Laboratory tests per flow sheet.    IMPRESSION:  Clinically stable.  Superficial skin issues.    PLAN:     1. She will continue IVIG today.    2. Copies of blood work given.  3. She will be placed on Augmentin 875 one 2 times a day.  She is well aware of side effects and has tolerated this antibiotic in the past.  4. She knows we recommend flu vaccine.  5. She will return probably in about 4 to 6 weeks.  Part of this will depend upon her upcoming travel plans.  Questions were addressed and she will return as noted.    EEL/mjs  [id number]

## 2013-06-01 MED ORDER — atenolol (TENORMIN) 50 MG tablet
50 | ORAL_TABLET | ORAL | 0.00 refills | 30.00000 days | Status: AC
Start: 2013-06-01 — End: 2014-03-22

## 2013-06-08 ENCOUNTER — Ambulatory Visit: Admit: 2013-06-08 | Payer: MEDICARE | Attending: Hematology & Oncology

## 2013-06-08 ENCOUNTER — Other Ambulatory Visit: Admit: 2013-06-08 | Payer: MEDICARE

## 2013-06-08 ENCOUNTER — Ambulatory Visit: Admit: 2013-06-08 | Payer: MEDICARE

## 2013-06-08 DIAGNOSIS — C4490 Unspecified malignant neoplasm of skin, unspecified: Secondary | ICD-10-CM

## 2013-06-08 DIAGNOSIS — D809 Immunodeficiency with predominantly antibody defects, unspecified: Secondary | ICD-10-CM

## 2013-06-08 DIAGNOSIS — C50919 Malignant neoplasm of unspecified site of unspecified female breast: Secondary | ICD-10-CM

## 2013-06-08 LAB — COMPREHENSIVE METABOLIC PANEL
ALT: 15 U/L (ref 7–52)
AST: 19 U/L (ref 13–39)
Albumin: 4.5 g/dL (ref 3.5–5.7)
Alkaline Phosphatase: 73 U/L (ref 36–125)
Anion Gap: 3 mmol/L (ref 3–16)
BUN: 22 mg/dL (ref 7–25)
CO2: 28 mmol/L (ref 21–33)
Calcium: 9.5 mg/dL (ref 8.6–10.3)
Chloride: 106 mmol/L (ref 98–110)
Creatinine: 0.69 mg/dL (ref 0.60–1.30)
GFR MDRD Af Amer: 99 See note.
GFR MDRD Non Af Amer: 82 See note.
Glucose: 82 mg/dL (ref 70–100)
Osmolality, Calculated: 286 mOsm/kg (ref 278–305)
Potassium: 3.8 mmol/L (ref 3.5–5.3)
Sodium: 137 mmol/L (ref 133–146)
Total Bilirubin: 0.6 mg/dL (ref 0.0–1.5)
Total Protein: 6.4 g/dL (ref 6.4–8.9)

## 2013-06-08 LAB — CBC
Hematocrit: 40.5 % (ref 35.0–45.0)
Hemoglobin: 13.4 g/dL (ref 11.7–15.5)
MCH: 28.8 pg (ref 27.0–33.0)
MCHC: 33.2 g/dL (ref 32.0–36.0)
MCV: 86.8 fL (ref 80.0–100.0)
MPV: 10.4 fL (ref 7.5–11.5)
Platelets: 120 10*3/uL (ref 140–400)
RBC: 4.66 10*6/uL (ref 3.80–5.10)
RDW: 14.6 % (ref 11.0–15.0)
WBC: 3.7 10*3/uL (ref 3.8–10.8)

## 2013-06-08 LAB — DIFFERENTIAL
Basophils Absolute: 7 /uL (ref 0–200)
Basophils Relative: 0.2 % (ref 0.0–1.0)
Eosinophils Absolute: 44 /uL (ref 15–500)
Eosinophils Relative: 1.2 % (ref 0.0–8.0)
Lymphocytes Absolute: 592 /uL (ref 850–3900)
Lymphocytes Relative: 16 % (ref 15.0–45.0)
Monocytes Absolute: 548 /uL (ref 200–950)
Monocytes Relative: 14.8 % (ref 0.0–12.0)
Neutrophils Absolute: 2509 /uL (ref 1500–7800)
Neutrophils Relative: 67.8 % (ref 40.0–80.0)

## 2013-06-08 LAB — IGG: IgG: 733 mg/dL (ref 751.0–1560.0)

## 2013-06-08 LAB — IGM: IgM: 76.1 mg/dL (ref 46.0–304.0)

## 2013-06-08 LAB — IGA: IgA: 62.1 mg/dL — ABNORMAL LOW (ref 82.0–453.0)

## 2013-06-08 MED ORDER — sodium chloride 0.9 % flush 10 mL
Freq: Every day | INTRAMUSCULAR | Status: AC | PRN
Start: 2013-06-08 — End: 2013-06-08

## 2013-06-08 MED ORDER — diphenhydrAMINE (BENADRYL) capsule 25 mg
25 | Freq: Once | ORAL | Status: AC
Start: 2013-06-08 — End: 2013-06-08
  Administered 2013-06-08: 18:00:00 25 mg via ORAL

## 2013-06-08 MED ORDER — sodium chloride 0.9 % infusion
Freq: Every day | INTRAVENOUS | Status: AC | PRN
Start: 2013-06-08 — End: 2013-06-08
  Administered 2013-06-08: 18:00:00 25 mL/h via INTRAVENOUS

## 2013-06-08 MED ORDER — immune globulin (human) (IgG) (GAMMAGARD) Soln 25 g
10 | Freq: Once | INTRAVENOUS | Status: AC
Start: 2013-06-08 — End: 2013-06-08
  Administered 2013-06-08: 18:00:00 25 g/kg via INTRAVENOUS

## 2013-06-08 MED ORDER — hydrocortisone sod succ (PF)(SOLU-CORTEF) 100 mg injection
100 | Freq: Every day | INTRAMUSCULAR | Status: AC | PRN
Start: 2013-06-08 — End: 2013-06-08

## 2013-06-08 MED ORDER — diphenhydrAMINE (BENADRYL) injection 25 mg
50 | Freq: Every day | INTRAMUSCULAR | Status: AC | PRN
Start: 2013-06-08 — End: 2013-06-08

## 2013-06-08 MED ORDER — albuterol (PROVENTIL HFA;VENTOLIN HFA) inhaler 1-2 puff
90 | RESPIRATORY_TRACT | Status: AC | PRN
Start: 2013-06-08 — End: 2013-06-08

## 2013-06-08 MED ORDER — acetaminophen (TYLENOL) tablet 650 mg
325 | Freq: Once | ORAL | Status: AC
Start: 2013-06-08 — End: 2013-06-08
  Administered 2013-06-08: 18:00:00 650 mg via ORAL

## 2013-06-08 MED ORDER — EPINEPHrine (ADRENALIN) injection 0.3 mg
1 | Freq: Every day | INTRAMUSCULAR | Status: AC | PRN
Start: 2013-06-08 — End: 2013-06-08

## 2013-06-08 MED ORDER — heparin lock flush 100 unit/mL Syrg 500 Units
100 | Freq: Every day | INTRAVENOUS | Status: AC | PRN
Start: 2013-06-08 — End: 2013-06-08

## 2013-06-08 MED ORDER — amoxicillin-clavulanate (AUGMENTIN) 875-125 mg per tablet
875-125 | ORAL_TABLET | Freq: Two times a day (BID) | ORAL | Status: AC
Start: 2013-06-08 — End: 2014-06-26

## 2013-06-08 MED FILL — GAMMAGARD LIQUID 10 % INJECTION SOLUTION: 10 10 % | INTRAMUSCULAR | Qty: 250

## 2013-06-08 MED FILL — DIPHENHYDRAMINE 25 MG CAPSULE: 25 25 mg | ORAL | Qty: 1

## 2013-06-08 MED FILL — TYLENOL 325 MG TABLET: 325 325 mg | ORAL | Qty: 2

## 2013-06-08 NOTE — Unmapped (Signed)
See dictated ntoe

## 2013-06-08 NOTE — Unmapped (Signed)
Pt here for IVIG infusion. Pt seen by Dr. Samara Snide today. Med list updated and assessment completed at MD visit. Pt here for IVIG. Treatment plan reads to have crash cart readily available. Crash cart not available at office location. Discussed with manager Lupita Shutter RN. Okay to proceed with treatment without crash cart. IVIG infused as ordered. Pt tolerated well. PIV flushed with 20ml NS and discontinued. Pt given AVS. Pt left area ambulatory.

## 2013-06-12 NOTE — Unmapped (Signed)
Spoke with the patient and informed her of her IGG results. Pt stated understanding.

## 2013-06-13 NOTE — Unmapped (Signed)
DICTATED ADDENDUM.  PROVIDER TO REVIEW     DIAGNOSIS(ES): History of Non-Hodgkin's lymphoma with recurrence, now with IgG deficiency.      Ashley Madden is here for follow-up.  She just got back from [place] last night.  She has been receiving her IVIG every 5 to 6 weeks down there.  Unfortunately she developed a skin cancer on her left leg which looks like it could potentially be superinfected.  She was on antibiotics in [place].  They are now discontinued.  She has both a dermatologist and a wound care team here in [place] which she will need to get reengaged.  She does not have fevers or chills.  She drove up and so her legs are a little bit swollen today, but not dramatically so considering the 2-day drive.  She has had no fevers, no chills.  Her white count is slightly low but she has adequate granulocytes and she obviously does need to continue with her IVIG.  She is very disturbed that her surgeon is now on medical leave.  I have told her that we would be happy to help her with issues as they come up and she will most likely need to have chest imaging, as well as mammogram, DEXA scan, etc. this fall.  She otherwise feels that she has had a little bit of breathlessness.  It has been almost a year since she had an x-ray.  I told her we could get it pretty much at any time, but I think she will wait till this fall for x-rays unless she has a change in her symptomatology. She is not losing weight.  Appetite is fine.  No GI, GU, neurologic, musculoskeletal, cutaneous, endocrine, psychiatric, hematologic or other major difficulties except as noted above.    PHYSICAL EXAMINATION:  Vital signs per flow sheet.  She is alert, oriented and otherwise in no acute distress.  Her throat is clear without ulceration or Candida.  Lungs are clear.  Cardiac exam- no murmurs, rubs or gallops.  No cervical, supra, infraclavicular adenopathy.  Breast area was not examined today.  Abdomen is soft without ascites, jaundice or  organomegaly.  She does have about 1+ to trace lower extremity edema which is pretty symmetric on her left leg.  She does have about a 6 cm area of prior surgery from skin cancer.  She reports this is squamous cell, but I do not have the path for confirmation.  There is a little bit of fluctuance superiorly, but it is not warm.  It is not red.  I think it may just be part of the whole healing process, although it will need to be monitored very carefully.  There is no other active skin lesions.  Musculoskeletal- no active arthritis or fracture.  Neurologic- mental status, alert and oriented.  Cranial nerves grossly intact.  Motor and sensory grossly intact.  Gait is unremarkable.  Psychiatric- no overt anxiety or depression.      Laboratory tests all per flow sheet.    IMPRESSION:    1. Questionably superinfected squamous cell carcinoma biopsy.    2. IgG deficiency.    3. History non-Hodgkin's lymphoma.    4. History of breast cancer.    PLAN:    1. Breast cancer survivorship issues discussed.    2. I have started her on Augmentin antibiotic.  I did caution her that if things worsen, she should let us know.  3. She will contact her wound care team here in [place].  4. She will continue with IVIG today.  We will check quantitative immunoglobulins.    5. We did discuss ordering her mammogram and other imaging this fall.    6. Questions of hers and her husband who accompany her have otherwise been addressed and she will return as noted.    EEL/nc  [id number]

## 2013-06-26 ENCOUNTER — Encounter: Payer: MEDICARE | Attending: Hematology & Oncology

## 2013-06-27 ENCOUNTER — Encounter: Payer: MEDICARE | Attending: Hematology & Oncology

## 2013-07-13 ENCOUNTER — Ambulatory Visit: Admit: 2013-07-13 | Payer: MEDICARE | Attending: Hematology & Oncology

## 2013-07-13 ENCOUNTER — Ambulatory Visit: Admit: 2013-07-13 | Discharge: 2013-07-13 | Payer: MEDICARE

## 2013-07-13 ENCOUNTER — Ambulatory Visit: Admit: 2013-07-13 | Payer: MEDICARE

## 2013-07-13 DIAGNOSIS — D809 Immunodeficiency with predominantly antibody defects, unspecified: Secondary | ICD-10-CM

## 2013-07-13 DIAGNOSIS — C50919 Malignant neoplasm of unspecified site of unspecified female breast: Secondary | ICD-10-CM

## 2013-07-13 LAB — COMPREHENSIVE METABOLIC PANEL
ALT: 13 U/L (ref 7–52)
AST: 16 U/L (ref 13–39)
Albumin: 4.1 g/dL (ref 3.5–5.7)
Alkaline Phosphatase: 62 U/L (ref 36–125)
Anion Gap: 2 mmol/L (ref 3–16)
BUN: 23 mg/dL (ref 7–25)
CO2: 32 mmol/L (ref 21–33)
Calcium: 9.4 mg/dL (ref 8.6–10.3)
Chloride: 105 mmol/L (ref 98–110)
Creatinine: 0.71 mg/dL (ref 0.60–1.30)
GFR MDRD Af Amer: 96 See note.
GFR MDRD Non Af Amer: 79 See note.
Glucose: 66 mg/dL (ref 70–100)
Osmolality, Calculated: 290 mOsm/kg (ref 278–305)
Potassium: 3.9 mmol/L (ref 3.5–5.3)
Sodium: 139 mmol/L (ref 133–146)
Total Bilirubin: 0.8 mg/dL (ref 0.0–1.5)
Total Protein: 6.2 g/dL (ref 6.4–8.9)

## 2013-07-13 LAB — DIFFERENTIAL
Basophils Absolute: 8 /uL (ref 0–200)
Basophils Relative: 0.2 % (ref 0.0–1.0)
Eosinophils Absolute: 72 /uL (ref 15–500)
Eosinophils Relative: 1.8 % (ref 0.0–8.0)
Lymphocytes Absolute: 636 /uL (ref 850–3900)
Lymphocytes Relative: 15.9 % (ref 15.0–45.0)
Monocytes Absolute: 540 /uL (ref 200–950)
Monocytes Relative: 13.5 % (ref 0.0–12.0)
Neutrophils Absolute: 2744 /uL (ref 1500–7800)
Neutrophils Relative: 68.6 % (ref 40.0–80.0)

## 2013-07-13 LAB — CBC
Hematocrit: 38.5 % (ref 35.0–45.0)
Hemoglobin: 12.9 g/dL (ref 11.7–15.5)
MCH: 29.5 pg (ref 27.0–33.0)
MCHC: 33.6 g/dL (ref 32.0–36.0)
MCV: 87.8 fL (ref 80.0–100.0)
MPV: 10.7 fL (ref 7.5–11.5)
Platelets: 119 10*3/uL (ref 140–400)
RBC: 4.38 10*6/uL (ref 3.80–5.10)
RDW: 14.9 % (ref 11.0–15.0)
WBC: 4 10*3/uL (ref 3.8–10.8)

## 2013-07-13 LAB — LACTATE DEHYDROGENASE: LD: 173 U/L (ref 110–270)

## 2013-07-13 MED ORDER — diphenhydrAMINE (BENADRYL) injection 25 mg
50 | Freq: Every day | INTRAMUSCULAR | Status: AC | PRN
Start: 2013-07-13 — End: 2013-07-13

## 2013-07-13 MED ORDER — sodium chloride 0.9 % flush 10 mL
Freq: Every day | INTRAMUSCULAR | Status: AC | PRN
Start: 2013-07-13 — End: 2013-07-13

## 2013-07-13 MED ORDER — acetaminophen (TYLENOL) tablet 650 mg
325 | Freq: Once | ORAL | Status: AC
Start: 2013-07-13 — End: 2013-07-13
  Administered 2013-07-13: 15:00:00 650 mg via ORAL

## 2013-07-13 MED ORDER — albuterol (PROVENTIL HFA;VENTOLIN HFA) inhaler 1-2 puff
90 | RESPIRATORY_TRACT | Status: AC | PRN
Start: 2013-07-13 — End: 2013-07-13

## 2013-07-13 MED ORDER — heparin lock flush 100 unit/mL Syrg 500 Units
100 | Freq: Every day | INTRAVENOUS | Status: AC | PRN
Start: 2013-07-13 — End: 2013-07-13

## 2013-07-13 MED ORDER — hydrocortisone sod succ (PF)(SOLU-CORTEF) 100 mg injection
100 | Freq: Every day | INTRAMUSCULAR | Status: AC | PRN
Start: 2013-07-13 — End: 2013-07-13

## 2013-07-13 MED ORDER — sodium chloride 0.9 % infusion
Freq: Every day | INTRAVENOUS | Status: AC | PRN
Start: 2013-07-13 — End: 2013-07-13
  Administered 2013-07-13: 16:00:00 25 mL/h via INTRAVENOUS

## 2013-07-13 MED ORDER — EPINEPHrine (ADRENALIN) injection 0.3 mg
1 | Freq: Every day | INTRAMUSCULAR | Status: AC | PRN
Start: 2013-07-13 — End: 2013-07-13

## 2013-07-13 MED ORDER — diphenhydrAMINE (BENADRYL) capsule 25 mg
25 | Freq: Once | ORAL | Status: AC
Start: 2013-07-13 — End: 2013-07-13
  Administered 2013-07-13: 15:00:00 25 mg via ORAL

## 2013-07-13 MED ORDER — immune globulin (human) (IgG) (GAMMAGARD) Soln 25 g
10 | Freq: Once | INTRAVENOUS | Status: AC
Start: 2013-07-13 — End: 2013-07-13
  Administered 2013-07-13: 16:00:00 25 g/kg via INTRAVENOUS

## 2013-07-13 MED FILL — GAMMAGARD LIQUID 10 % INJECTION SOLUTION: 10 10 % | INTRAMUSCULAR | Qty: 250

## 2013-07-13 MED FILL — TYLENOL 325 MG TABLET: 325 325 mg | ORAL | Qty: 2

## 2013-07-13 MED FILL — DIPHENHYDRAMINE 25 MG CAPSULE: 25 25 mg | ORAL | Qty: 1

## 2013-07-13 NOTE — Unmapped (Signed)
Pt here for IVIG. Denies any complaints. Pt assessed as high fall risk. Oriented to call for assistance ambulating, verbalized good understanding. Call light given to pt. Fall Risk armband placed on wrist and Patient Safety flyer given to pt.  Premeds given followed by IVIG. Pt tol well w/ slight increase in BP toward end of infusion but no c/o headache, etc.   Vss, see flowsheet for vs/rates/times. PIV d/c'd. AVS containing next appt printed and given to pt.  Left area ambulatory.

## 2013-07-13 NOTE — Unmapped (Signed)
see dictated note

## 2013-07-16 NOTE — Unmapped (Signed)
[facility] called and left a message advising patient that she has been scheduled for her screening mammogram on 10/15/13 @ 7:45 am, with an arrival time of 7:30 am.  [facility] advised patient of prep and location.  [facility] requested a return call @ [phone] if the patient has any questions or concerns regarding this appointment.

## 2013-07-17 NOTE — Unmapped (Signed)
DICTATED ADDENDUM.  PROVIDER TO REVIEW.      DIAGNOSIS(ES):    1. Breast cancer.  2. Non-Hodgkin's lymphoma.    3. History of IgG deficiency.      Ashley Madden is here for IVIG today.  Overall she is doing pretty well.  She has a little bit of cough, shortness of breath.  I do not really hear anything that makes me think she is infected.  She is still having issues with her legs.  She did see Dr. Jerald Kief about it and he thought she needed to wear more support hose which is tough for her particularly in the summer months although she has purchased some.  She does not have any redness or anything to suggest infection but she has had some superficial issues in the past.  No other new GI, GU, pulmonary, cardiac, neurologic, musculoskeletal, cutaneous, endocrine, psychiatric or other difficulties.      Remainder of a 10-point review of systems is unchanged.    PHYSICAL EXAMINATION:  Vital signs per flow sheet.  She is alert, oriented and otherwise in no acute distress.  Throat is clear without ulceration or Candida.  Lungs are clear.  Cardiac exam: No murmur, rub or gallop.  Chest wall is unchanged.  Nothing new or suspicious.  Abdomen without ascites, jaundice or organomegaly.  There is no lower extremity edema.  She is not clubbed.  Skin without rash or ecchymoses.  Musculoskeletal: No active arthritis or fracture.  Neurologic: Mental status alert and oriented.  Cranial nerves grossly intact.  Motor and sensory grossly intact.  Gait is unremarkable.  Psychiatric: No overt anxiety or depression.      Laboratory tests all per flow sheet.    IMPRESSION:  Clinically doing well.    PLAN:    1. Patient will continue with current regimen.  2. Copies of blood work given.  3. Psychosocial issues discussed.  4. She does know to contact us if she gets fevers, chills or signs of infection.  5. Mammogram ordered for September.    6. She will return as noted.    EEL/ja  [id number]

## 2013-08-10 ENCOUNTER — Ambulatory Visit: Payer: MEDICARE

## 2013-08-10 ENCOUNTER — Encounter: Payer: MEDICARE | Attending: Hematology & Oncology

## 2013-08-10 NOTE — Unmapped (Signed)
No show

## 2013-08-15 ENCOUNTER — Encounter: Payer: MEDICARE | Attending: Hematology & Oncology

## 2013-08-17 ENCOUNTER — Ambulatory Visit: Admit: 2013-08-17 | Payer: MEDICARE

## 2013-08-17 ENCOUNTER — Ambulatory Visit: Admit: 2013-08-17 | Payer: MEDICARE | Attending: Hematology & Oncology

## 2013-08-17 DIAGNOSIS — D803 Selective deficiency of immunoglobulin G [IgG] subclasses: Secondary | ICD-10-CM

## 2013-08-17 DIAGNOSIS — D809 Immunodeficiency with predominantly antibody defects, unspecified: Secondary | ICD-10-CM

## 2013-08-17 DIAGNOSIS — C50919 Malignant neoplasm of unspecified site of unspecified female breast: Secondary | ICD-10-CM

## 2013-08-17 LAB — COMPREHENSIVE METABOLIC PANEL
ALT: 16 U/L (ref 7–52)
AST: 19 U/L (ref 13–39)
Albumin: 4.3 g/dL (ref 3.5–5.7)
Alkaline Phosphatase: 67 U/L (ref 36–125)
Anion Gap: 5 mmol/L (ref 3–16)
BUN: 20 mg/dL (ref 7–25)
CO2: 31 mmol/L (ref 21–33)
Calcium: 9.8 mg/dL (ref 8.6–10.3)
Chloride: 103 mmol/L (ref 98–110)
Creatinine: 0.75 mg/dL (ref 0.60–1.30)
GFR MDRD Af Amer: 90 See note.
GFR MDRD Non Af Amer: 74 See note.
Glucose: 88 mg/dL (ref 70–100)
Osmolality, Calculated: 290 mosm/kg (ref 278–305)
Potassium: 4.4 mmol/L (ref 3.5–5.3)
Sodium: 139 mmol/L (ref 133–146)
Total Bilirubin: 0.9 mg/dL (ref 0.0–1.5)
Total Protein: 6.7 g/dL (ref 6.4–8.9)

## 2013-08-17 LAB — CBC
Hematocrit: 40.9 % (ref 35.0–45.0)
Hemoglobin: 13.7 g/dL (ref 11.7–15.5)
MCH: 29.1 pg (ref 27.0–33.0)
MCHC: 33.6 g/dL (ref 32.0–36.0)
MCV: 86.7 fL (ref 80.0–100.0)
MPV: 11 fL (ref 7.5–11.5)
Platelets: 147 10*3/uL (ref 140–400)
RBC: 4.72 10*6/uL (ref 3.80–5.10)
RDW: 14.3 % (ref 11.0–15.0)
WBC: 5 10*3/uL (ref 3.8–10.8)

## 2013-08-17 LAB — DIFFERENTIAL
Basophils Absolute: 15 /uL (ref 0–200)
Basophils Relative: 0.3 % (ref 0.0–1.0)
Eosinophils Absolute: 100 /uL (ref 15–500)
Eosinophils Relative: 2 % (ref 0.0–8.0)
Lymphocytes Absolute: 885 /uL (ref 850–3900)
Lymphocytes Relative: 17.7 % (ref 15.0–45.0)
Monocytes Absolute: 715 /uL (ref 200–950)
Monocytes Relative: 14.3 % — ABNORMAL HIGH (ref 0.0–12.0)
Neutrophils Absolute: 3285 /uL (ref 1500–7800)
Neutrophils Relative: 65.7 % (ref 40.0–80.0)

## 2013-08-17 MED ORDER — diphenhydrAMINE (BENADRYL) injection 25 mg
50 | Freq: Every day | INTRAMUSCULAR | Status: AC | PRN
Start: 2013-08-17 — End: 2013-08-17

## 2013-08-17 MED ORDER — sodium chloride 0.9 % infusion
Freq: Every day | INTRAVENOUS | Status: AC | PRN
Start: 2013-08-17 — End: 2013-08-17
  Administered 2013-08-17: 16:00:00 25 mL/h via INTRAVENOUS

## 2013-08-17 MED ORDER — heparin lock flush 100 unit/mL Syrg 500 Units
100 | Freq: Every day | INTRAVENOUS | Status: AC | PRN
Start: 2013-08-17 — End: 2013-08-17

## 2013-08-17 MED ORDER — dicyclomine (BENTYL) 10 MG capsule
10 | ORAL_CAPSULE | ORAL | Status: AC | PRN
Start: 2013-08-17 — End: 2013-09-14

## 2013-08-17 MED ORDER — hydrocortisone sod succ (PF)(SOLU-CORTEF) 100 mg injection
100 | Freq: Every day | INTRAMUSCULAR | Status: AC | PRN
Start: 2013-08-17 — End: 2013-08-17

## 2013-08-17 MED ORDER — immune globulin (human) (IgG) (GAMMAGARD) Soln 25 g
10 | Freq: Once | INTRAVENOUS | Status: AC
Start: 2013-08-17 — End: 2013-08-17
  Administered 2013-08-17: 16:00:00 25 g/kg via INTRAVENOUS

## 2013-08-17 MED ORDER — diphenhydrAMINE (BENADRYL) capsule 25 mg
25 | Freq: Once | ORAL | Status: AC
Start: 2013-08-17 — End: 2013-08-17
  Administered 2013-08-17: 16:00:00 25 mg via ORAL

## 2013-08-17 MED ORDER — EPINEPHrine (ADRENALIN) injection 0.3 mg
1 | Freq: Every day | INTRAMUSCULAR | Status: AC | PRN
Start: 2013-08-17 — End: 2013-08-17

## 2013-08-17 MED ORDER — albuterol (PROVENTIL HFA;VENTOLIN HFA) inhaler 1-2 puff
90 | RESPIRATORY_TRACT | Status: AC | PRN
Start: 2013-08-17 — End: 2013-08-17

## 2013-08-17 MED ORDER — acetaminophen (TYLENOL) tablet 650 mg
325 | Freq: Once | ORAL | Status: AC
Start: 2013-08-17 — End: 2013-08-17
  Administered 2013-08-17: 16:00:00 650 mg via ORAL

## 2013-08-17 MED ORDER — sodium chloride 0.9 % flush 10 mL
Freq: Every day | INTRAMUSCULAR | Status: AC | PRN
Start: 2013-08-17 — End: 2013-08-17

## 2013-08-17 MED FILL — GAMMAGARD LIQUID 10 % INJECTION SOLUTION: 10 10 % | INTRAMUSCULAR | Qty: 250

## 2013-08-17 MED FILL — DIPHENHYDRAMINE 25 MG CAPSULE: 25 25 mg | ORAL | Qty: 1

## 2013-08-17 MED FILL — TYLENOL 325 MG TABLET: 325 325 mg | ORAL | Qty: 2

## 2013-08-17 NOTE — Unmapped (Signed)
See dictated note

## 2013-08-17 NOTE — Unmapped (Signed)
Arrived ambulatory after md/NP appt.  See md/NP note for assessment vital signs and update of allergies and medications.  Here for IVIG.  States has been tolerating without any problems.  Tolerated tx today without difficulty.  Patient instructed to stop at desk to arrange next appointment and to receive AVS.

## 2013-08-21 NOTE — Unmapped (Signed)
DICTATED ADDENDUM.  PROVIDER TO REVIEW.    DIAGNOSIS(ES):    1. Breast cancer.   2. NonHodgkin's lymphoma.   3. 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      Ashley Madden is here for follow-up.  She is very active today as her church is having their big festival and she has been active doing cooking, Agricultural consultant work, Catering manager.  She has had no new intercurrent infection.  She recently got over a sinus infection.  No cough, GI, GU, pulmonary, neurologic, musculoskeletal, cutaneous, endocrine, psychiatric, hematologic or other major difficulties.  She tolerates her infusions well.  When we do stretch them out too far she does have problems with intercurrent infections.  She is up-to-date with other testing.  Mammogram will be due and has been ordered for her.  Remainder of a 10-point review of systems is unchanged.    PHYSICAL EXAMINATION:  Vital signs per flow sheet.  She is alert, oriented and otherwise in no acute distress.  Her throat is clear without ulceration or Candida.  Lungs are clear.  Cardiac exam- no murmurs, rubs or gallops.  No cervical, supra, infraclavicular adenopathy.  Chest wall is unchanged, nothing new or suspicious.  Abdomen is soft without ascites, jaundice or organomegaly.  There is no lower extremity edema.  Skin is without rash or ecchymoses.  Musculoskeletal- no active arthritis or fracture.  Neurologic/mental status- alert and oriented.  Cranial nerves grossly intact.  Motor and sensory grossly intact.  Gait is unremarkable.  Psychiatric- no overt anxiety or depression.      Laboratory tests all per flow sheet.    IMPRESSION:  Clinically doing well.    PLAN:    1. Patient will continue IVIG.   2. She will contact us if she develops signs of infection.    3. Copies of blood work given and reviewed.    4. LDH has also been ordered.    5. Mammogram ordered.    6. She will return as directed.    EEL/tlm   [id number]

## 2013-09-14 ENCOUNTER — Ambulatory Visit: Admit: 2013-09-14 | Payer: MEDICARE

## 2013-09-14 ENCOUNTER — Ambulatory Visit: Admit: 2013-09-14 | Payer: MEDICARE | Attending: Hematology & Oncology

## 2013-09-14 DIAGNOSIS — C50919 Malignant neoplasm of unspecified site of unspecified female breast: Secondary | ICD-10-CM

## 2013-09-14 DIAGNOSIS — D809 Immunodeficiency with predominantly antibody defects, unspecified: Secondary | ICD-10-CM

## 2013-09-14 LAB — DIFFERENTIAL
Basophils Absolute: 18 /uL (ref 0–200)
Basophils Relative: 0.5 % (ref 0.0–1.0)
Eosinophils Absolute: 76 /uL (ref 15–500)
Eosinophils Relative: 2.1 % (ref 0.0–8.0)
Lymphocytes Absolute: 565 /uL (ref 850–3900)
Lymphocytes Relative: 15.7 % (ref 15.0–45.0)
Monocytes Absolute: 526 /uL (ref 200–950)
Monocytes Relative: 14.6 % (ref 0.0–12.0)
Neutrophils Absolute: 2416 /uL (ref 1500–7800)
Neutrophils Relative: 67.1 % (ref 40.0–80.0)

## 2013-09-14 LAB — COMPREHENSIVE METABOLIC PANEL
ALT: 14 U/L (ref 7–52)
AST: 17 U/L (ref 13–39)
Albumin: 4.1 g/dL (ref 3.5–5.7)
Alkaline Phosphatase: 71 U/L (ref 36–125)
Anion Gap: 4 mmol/L (ref 3–16)
BUN: 20 mg/dL (ref 7–25)
CO2: 30 mmol/L (ref 21–33)
Calcium: 9.3 mg/dL (ref 8.6–10.3)
Chloride: 105 mmol/L (ref 98–110)
Creatinine: 0.77 mg/dL (ref 0.60–1.30)
GFR MDRD Af Amer: 87 See note.
GFR MDRD Non Af Amer: 72 See note.
Glucose: 75 mg/dL (ref 70–100)
Osmolality, Calculated: 289 mOsm/kg (ref 278–305)
Potassium: 4 mmol/L (ref 3.5–5.3)
Sodium: 139 mmol/L (ref 133–146)
Total Bilirubin: 0.7 mg/dL (ref 0.0–1.5)
Total Protein: 6.4 g/dL (ref 6.4–8.9)

## 2013-09-14 LAB — CBC
Hematocrit: 38.5 % (ref 35.0–45.0)
Hemoglobin: 13.1 g/dL (ref 11.7–15.5)
MCH: 29.6 pg (ref 27.0–33.0)
MCHC: 34 g/dL (ref 32.0–36.0)
MCV: 87.1 fL (ref 80.0–100.0)
MPV: 10.2 fL (ref 7.5–11.5)
Platelets: 135 10*3/uL (ref 140–400)
RBC: 4.43 10*6/uL (ref 3.80–5.10)
RDW: 14.5 % (ref 11.0–15.0)
WBC: 3.6 10*3/uL (ref 3.8–10.8)

## 2013-09-14 LAB — LACTATE DEHYDROGENASE: LD: 162 U/L (ref 110–270)

## 2013-09-14 MED ORDER — albuterol (PROVENTIL HFA;VENTOLIN HFA) inhaler 1-2 puff
90 | RESPIRATORY_TRACT | Status: AC | PRN
Start: 2013-09-14 — End: 2013-09-14

## 2013-09-14 MED ORDER — dicyclomine (BENTYL) 10 MG capsule
10 | ORAL_CAPSULE | ORAL | Status: AC | PRN
Start: 2013-09-14 — End: 2018-09-01

## 2013-09-14 MED ORDER — EPINEPHrine (ADRENALIN) 1 mg/mL (1:1,000) injection 0.3 mg
1 | Freq: Every day | INTRAMUSCULAR | Status: AC | PRN
Start: 2013-09-14 — End: 2013-09-14

## 2013-09-14 MED ORDER — hydrocortisone sod succ (PF)(SOLU-CORTEF) 100 mg injection
100 | Freq: Every day | INTRAMUSCULAR | Status: AC | PRN
Start: 2013-09-14 — End: 2013-09-14

## 2013-09-14 MED ORDER — diphenhydrAMINE (BENADRYL) injection 25 mg
50 | Freq: Every day | INTRAMUSCULAR | Status: AC | PRN
Start: 2013-09-14 — End: 2013-09-14

## 2013-09-14 MED ORDER — sodium chloride 0.9 % flush 10 mL
Freq: Every day | INTRAMUSCULAR | Status: AC | PRN
Start: 2013-09-14 — End: 2013-09-14

## 2013-09-14 MED ORDER — diphenhydrAMINE (BENADRYL) capsule 25 mg
25 | Freq: Once | ORAL | Status: AC
Start: 2013-09-14 — End: 2013-09-14
  Administered 2013-09-14: 17:00:00 25 mg via ORAL

## 2013-09-14 MED ORDER — heparin lock flush 100 unit/mL Syrg 500 Units
100 | Freq: Every day | INTRAVENOUS | Status: AC | PRN
Start: 2013-09-14 — End: 2013-09-14

## 2013-09-14 MED ORDER — immune globulin (human) (IgG) (GAMMAGARD) Soln 25 g
10 | Freq: Once | INTRAVENOUS | Status: AC
Start: 2013-09-14 — End: 2013-09-14
  Administered 2013-09-14: 17:00:00 25 g/kg via INTRAVENOUS

## 2013-09-14 MED ORDER — acetaminophen (TYLENOL) tablet 650 mg
325 | Freq: Once | ORAL | Status: AC
Start: 2013-09-14 — End: 2013-09-14
  Administered 2013-09-14: 17:00:00 650 mg via ORAL

## 2013-09-14 MED ORDER — sodium chloride 0.9 % infusion
Freq: Every day | INTRAVENOUS | Status: AC | PRN
Start: 2013-09-14 — End: 2013-09-14

## 2013-09-14 MED FILL — GAMMAGARD LIQUID 10 % INJECTION SOLUTION: 10 10 % | INTRAMUSCULAR | Qty: 250

## 2013-09-14 MED FILL — TYLENOL 325 MG TABLET: 325 325 mg | ORAL | Qty: 2

## 2013-09-14 MED FILL — DIPHENHYDRAMINE 25 MG CAPSULE: 25 25 mg | ORAL | Qty: 1

## 2013-09-14 NOTE — Unmapped (Signed)
See dictated ntoe

## 2013-09-14 NOTE — Unmapped (Signed)
Patient here for Day 1 Cycle 3 IVIG. Declined [facility] Patient Safety guide, given and  instructed to use call light, chair locked.  Instructed to call for assist to restroom from staff member only, reviewed Fall Risk protocol. Yellow wristband applied, given call light. AVS containing next appt printed and given to pt. Pt tolerated treatment without difficulty. PIV flushed with 10 ml 0-9% saline with + blood aspirate. Needle removed.Discharged ambulatory per self.

## 2013-09-17 NOTE — Unmapped (Signed)
[facility] called and spoke with patient regarding appointment for Dexa. Appointment is set for 11/12/13 at 10am (9:45am arrival) at [facility]. Prep -- patient is to wear loose clothing, no metal on clothing, and no calcium or multi-vitamin with calcium 24 hrs prior to appointment. Patient verbalized understanding.

## 2013-09-18 NOTE — Unmapped (Signed)
DICTATED ADDENDUM.  PROVIDER TO REVIEW.    DIAGNOSIS(ES):    1. NonHodgkin's lymphoma.   2. Immunoglobulin deficiency.   3. History of breast cancer.      Ashley Madden is here for continuation of IVIG.  Overall she is doing well.  She has had some cramps and problems with her stomach.  She does have some irritable bowel issues which may be contributing.  She was out of her Bentyl and did not call us.  I have cautioned her that she certainly can call and we can call that in for her.  She does have an upcoming vacation safari trip to [place] which she is excited about.  She is getting prescriptions from her PCP for her various inoculations and vaccinations that are needed.  I did caution her that it will be important for her to keep her immunoglobulins up while she is traveling so she does not have intercurrent infection.  She still has an area on her leg which has not fully healed and she will need to see the dermatologist again.  She has otherwise been doing pretty well.  No night sweats.  No fevers, no chills.  No cough, shortness of breath.  She would like to stop her inhaler which I think is reasonable but I suggested to her that she not cut back while she is traveling and do it when she returns.  She has had no chest pain.  No GI, GU, other complaints except as noted above.  She does have the cutaneous area which is noted. No new neurologic symptoms.  No head, neck symptoms.  She is not overly anxious or depressed.    PHYSICAL EXAMINATION:  Vital signs per flow sheet.  She is alert, oriented and otherwise in no acute distress.  Her throat is clear without ulceration or Candida.  Lungs are clear.  Cardiac exam- no murmur, rub or gallop change.  No cervical, supra, infraclavicular adenopathy.  Abdomen is soft without ascites, jaundice or organomegaly.  There is no lower extremity edema.  She is not clubbed.  Skin is without rash or ecchymoses.  Musculoskeletal- no active arthritis or fracture.  Neurologic/mental  status- alert and oriented.  Cranial nerves grossly intact.  Motor and sensory grossly intact.  Gait is unremarkable.  Psychiatric- no overt anxiety or depression.      Laboratory tests all per flow sheet.    IMPRESSION:  Doing very well.    PLAN:    1. Continue IVIG.   2. She will most likely receive additional treatments in [place].  She does not know her schedule and she will be either here or [place] for her next cycle.  3. She is due for mammogram next month.  This has been ordered for her.   4. She will see dermatology about the lesion on her leg.   5. No other questions were unanswered today.    6. She will be given a prescription for her Bentyl. It should be noted she does have a gastroenterologist that sees her.    7. She will return as directed.    EEL/tlm   [id number]

## 2013-10-12 ENCOUNTER — Ambulatory Visit: Admit: 2013-10-12 | Payer: MEDICARE

## 2013-10-12 ENCOUNTER — Ambulatory Visit: Admit: 2013-10-12 | Discharge: 2013-10-12 | Payer: MEDICARE

## 2013-10-12 ENCOUNTER — Ambulatory Visit: Admit: 2013-10-12 | Payer: MEDICARE | Attending: Hematology & Oncology

## 2013-10-12 DIAGNOSIS — D809 Immunodeficiency with predominantly antibody defects, unspecified: Secondary | ICD-10-CM

## 2013-10-12 DIAGNOSIS — C50919 Malignant neoplasm of unspecified site of unspecified female breast: Secondary | ICD-10-CM

## 2013-10-12 DIAGNOSIS — C8590 Non-Hodgkin lymphoma, unspecified, unspecified site: Secondary | ICD-10-CM

## 2013-10-12 LAB — CBC
Hematocrit: 38.9 % (ref 35.0–45.0)
Hemoglobin: 12.9 g/dL (ref 11.7–15.5)
MCH: 29 pg (ref 27.0–33.0)
MCHC: 33.2 g/dL (ref 32.0–36.0)
MCV: 87.2 fL (ref 80.0–100.0)
MPV: 10.6 fL (ref 7.5–11.5)
Platelets: 111 10*3/uL (ref 140–400)
RBC: 4.46 10*6/uL (ref 3.80–5.10)
RDW: 14.8 % (ref 11.0–15.0)
WBC: 3.4 10*3/uL (ref 3.8–10.8)

## 2013-10-12 LAB — DIFFERENTIAL
Basophils Absolute: 7 /uL (ref 0–200)
Basophils Relative: 0.2 % (ref 0.0–1.0)
Eosinophils Absolute: 68 /uL (ref 15–500)
Eosinophils Relative: 2 % (ref 0.0–8.0)
Lymphocytes Absolute: 476 /uL (ref 850–3900)
Lymphocytes Relative: 14 % (ref 15.0–45.0)
Monocytes Absolute: 405 /uL (ref 200–950)
Monocytes Relative: 11.9 % (ref 0.0–12.0)
Neutrophils Absolute: 2445 /uL (ref 1500–7800)
Neutrophils Relative: 71.9 % (ref 40.0–80.0)

## 2013-10-12 LAB — COMPREHENSIVE METABOLIC PANEL
ALT: 12 U/L (ref 7–52)
AST: 17 U/L (ref 13–39)
Albumin: 4 g/dL (ref 3.5–5.7)
Alkaline Phosphatase: 67 U/L (ref 36–125)
Anion Gap: 4 mmol/L (ref 3–16)
BUN: 21 mg/dL (ref 7–25)
CO2: 31 mmol/L (ref 21–33)
Calcium: 9.4 mg/dL (ref 8.6–10.3)
Chloride: 103 mmol/L (ref 98–110)
Creatinine: 0.73 mg/dL (ref 0.60–1.30)
GFR MDRD Af Amer: 93 See note.
GFR MDRD Non Af Amer: 77 See note.
Glucose: 73 mg/dL (ref 70–100)
Osmolality, Calculated: 288 mOsm/kg (ref 278–305)
Potassium: 3.9 mmol/L (ref 3.5–5.3)
Sodium: 138 mmol/L (ref 133–146)
Total Bilirubin: 0.6 mg/dL (ref 0.0–1.5)
Total Protein: 6.5 g/dL (ref 6.4–8.9)

## 2013-10-12 MED ORDER — diphenhydrAMINEBENADRYLinjection25mg
50 | Freq: Every day | INTRAMUSCULAR | Status: AC | PRN
Start: 2013-10-12 — End: 2013-10-12

## 2013-10-12 MED ORDER — sodium chloride 0.9 % infusion
Freq: Every day | INTRAVENOUS | Status: AC | PRN
Start: 2013-10-12 — End: 2013-10-12
  Administered 2013-10-12: 16:00:00 25 mL/h via INTRAVENOUS

## 2013-10-12 MED ORDER — EPINEPHrine (ADRENALIN) 1 mg/mL (1:1,000) injection 0.3 mg
1 | Freq: Every day | INTRAMUSCULAR | Status: AC | PRN
Start: 2013-10-12 — End: 2013-10-12

## 2013-10-12 MED ORDER — heparin lock flush 100 unit/mL Syrg 500 Units
100 | Freq: Every day | INTRAVENOUS | Status: AC | PRN
Start: 2013-10-12 — End: 2013-10-12

## 2013-10-12 MED ORDER — diphenhydrAMINE (BENADRYL) capsule 25 mg
25 | Freq: Once | ORAL | Status: AC
Start: 2013-10-12 — End: 2013-10-12
  Administered 2013-10-12: 16:00:00 25 mg via ORAL

## 2013-10-12 MED ORDER — hydrocortisone sod succ (PF)(SOLU-CORTEF) 100 mg injection
100 | Freq: Every day | INTRAMUSCULAR | Status: AC | PRN
Start: 2013-10-12 — End: 2013-10-12

## 2013-10-12 MED ORDER — sodium chloride 0.9 % flush 10 mL
Freq: Every day | INTRAMUSCULAR | Status: AC | PRN
Start: 2013-10-12 — End: 2013-10-12

## 2013-10-12 MED ORDER — immune globulin (human) (IgG) (GAMMAGARD) Soln 25 g
10 | Freq: Once | INTRAVENOUS | Status: AC
Start: 2013-10-12 — End: 2013-10-12
  Administered 2013-10-12: 16:00:00 25 g/kg via INTRAVENOUS

## 2013-10-12 MED ORDER — acetaminophen (TYLENOL) tablet 650 mg
325 | Freq: Once | ORAL | Status: AC
Start: 2013-10-12 — End: 2013-10-12
  Administered 2013-10-12: 16:00:00 650 mg via ORAL

## 2013-10-12 MED ORDER — albuterol (PROVENTIL HFA;VENTOLIN HFA) inhaler 1-2 puff
90 | RESPIRATORY_TRACT | Status: AC | PRN
Start: 2013-10-12 — End: 2013-10-12

## 2013-10-12 MED FILL — TYLENOL 325 MG TABLET: 325 325 mg | ORAL | Qty: 2

## 2013-10-12 MED FILL — GAMMAGARD LIQUID 10 % INJECTION SOLUTION: 10 10 % | INTRAMUSCULAR | Qty: 250

## 2013-10-12 MED FILL — DIPHENHYDRAMINE 25 MG CAPSULE: 25 25 mg | ORAL | Qty: 1

## 2013-10-12 NOTE — Unmapped (Signed)
Here for IgG, no c/o's, doing well, no problems with last tx, see flowsheets, tol well, pt d/c'd amb

## 2013-10-12 NOTE — Unmapped (Signed)
See dictated note

## 2013-10-15 ENCOUNTER — Inpatient Hospital Stay: Admit: 2013-10-15 | Payer: MEDICARE

## 2013-10-15 DIAGNOSIS — Z1231 Encounter for screening mammogram for malignant neoplasm of breast: Secondary | ICD-10-CM

## 2013-10-16 NOTE — Unmapped (Signed)
DICTATED ADDENDUM.  PROVIDER TO REVIEW.    DIAGNOSIS(ES):    1. Stage I breast cancer T1N0M0 triple-negative.  2. NonHodgkin's lymphoma.   3. 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     Ashley Madden is here for follow-up.  Overall she is doing pretty well.  She has not had any recent intercurrent infections and for the most part feels that she has been feeling a little bit better.  No new cough, shortness of breath, GI, GU, pulmonary, cardiac, neurologic, musculoskeletal, cutaneous, endocrine, psychiatric, hematologic problems.  She will be back in about 6 weeks immediately before she goes on her safari to [place] and she will need her IVIG prior to that time point. She is due both for mammogram and bone density this fall, both of these tests have been ordered for her.  Remainder of a 10-point review of systems is unchanged.    PHYSICAL EXAMINATION:  Vital signs per flow sheet.  She is alert, oriented and otherwise in no acute distress.  Her throat is clear without ulceration or Candida.  Lungs are clear.  Cardiac exam- no murmurs, rubs or gallops.  No cervical, supra, infraclavicular adenopathy.  Chest wall is unchanged.  Abdomen is soft without ascites, jaundice or organomegaly.  There is no lower extremity edema.  She is not clubbed.  Skin without rash.  No ecchymoses.  Musculoskeletal- no active arthritis or fracture.  Neurologic/mental status- alert and oriented.  Cranial nerves grossly intact.  Motor and sensory grossly unchanged.  Gait is unremarkable.  Psychiatric- no overt anxiety or depression.      Laboratory tests all per flow sheet.    IMPRESSION:  Doing well.    PLAN:    1. Patient will continue with current IVIG.   2. Mammogram and DEXA ordered.   3. Copies of blood work given.    4. She will return as noted.    EEL/tlm   [id number]

## 2013-11-12 ENCOUNTER — Inpatient Hospital Stay: Admit: 2013-11-12 | Payer: MEDICARE

## 2013-11-12 ENCOUNTER — Ambulatory Visit: Admit: 2013-11-12 | Payer: MEDICARE

## 2013-11-12 ENCOUNTER — Ambulatory Visit: Admit: 2013-11-12 | Payer: MEDICARE | Attending: Hematology & Oncology

## 2013-11-12 DIAGNOSIS — C8590 Non-Hodgkin lymphoma, unspecified, unspecified site: Secondary | ICD-10-CM

## 2013-11-12 DIAGNOSIS — D803 Selective deficiency of immunoglobulin G [IgG] subclasses: Secondary | ICD-10-CM

## 2013-11-12 DIAGNOSIS — C50919 Malignant neoplasm of unspecified site of unspecified female breast: Secondary | ICD-10-CM

## 2013-11-12 LAB — CBC
Hematocrit: 40 % (ref 35.0–45.0)
Hemoglobin: 13.2 g/dL (ref 11.7–15.5)
MCH: 28.5 pg (ref 27.0–33.0)
MCHC: 32.9 g/dL (ref 32.0–36.0)
MCV: 86.6 fL (ref 80.0–100.0)
MPV: 10.2 fL (ref 7.5–11.5)
Platelets: 137 10*3/uL (ref 140–400)
RBC: 4.62 10*6/uL (ref 3.80–5.10)
RDW: 14.7 % (ref 11.0–15.0)
WBC: 4 10*3/uL (ref 3.8–10.8)

## 2013-11-12 LAB — COMPREHENSIVE METABOLIC PANEL
ALT: 13 U/L (ref 7–52)
AST: 17 U/L (ref 13–39)
Albumin: 4.4 g/dL (ref 3.5–5.7)
Alkaline Phosphatase: 77 U/L (ref 36–125)
Anion Gap: 5 mmol/L (ref 3–16)
BUN: 20 mg/dL (ref 7–25)
CO2: 30 mmol/L (ref 21–33)
Calcium: 9.7 mg/dL (ref 8.6–10.3)
Chloride: 104 mmol/L (ref 98–110)
Creatinine: 0.7 mg/dL (ref 0.60–1.30)
GFR MDRD Af Amer: 97 See note.
GFR MDRD Non Af Amer: 80 See note.
Glucose: 99 mg/dL (ref 70–100)
Osmolality, Calculated: 291 mOsm/kg (ref 278–305)
Potassium: 4.1 mmol/L (ref 3.5–5.3)
Sodium: 139 mmol/L (ref 133–146)
Total Bilirubin: 0.8 mg/dL (ref 0.0–1.5)
Total Protein: 6.8 g/dL (ref 6.4–8.9)

## 2013-11-12 LAB — DIFFERENTIAL
Basophils Absolute: 12 /uL (ref 0–200)
Basophils Relative: 0.3 % (ref 0.0–1.0)
Eosinophils Absolute: 40 /uL (ref 15–500)
Eosinophils Relative: 1 % (ref 0.0–8.0)
Lymphocytes Absolute: 576 /uL — ABNORMAL LOW (ref 850–3900)
Lymphocytes Relative: 14.4 % — ABNORMAL LOW (ref 15.0–45.0)
Monocytes Absolute: 584 /uL (ref 200–950)
Monocytes Relative: 14.6 % — ABNORMAL HIGH (ref 0.0–12.0)
Neutrophils Absolute: 2788 /uL (ref 1500–7800)
Neutrophils Relative: 69.7 % (ref 40.0–80.0)

## 2013-11-12 MED ORDER — hydrocortisone sod succ (PF)(SOLU-CORTEF) 100 mg injection
100 | Freq: Every day | INTRAMUSCULAR | Status: AC | PRN
Start: 2013-11-12 — End: 2013-11-12

## 2013-11-12 MED ORDER — sodium chloride 0.9 % flush 10 mL
Freq: Every day | INTRAMUSCULAR | Status: AC | PRN
Start: 2013-11-12 — End: 2013-11-12

## 2013-11-12 MED ORDER — albuterol (PROVENTIL HFA;VENTOLIN HFA) inhaler 1-2 puff
90 | RESPIRATORY_TRACT | Status: AC | PRN
Start: 2013-11-12 — End: 2013-11-12

## 2013-11-12 MED ORDER — diphenhydrAMINE (BENADRYL) injection 25 mg
50 | Freq: Every day | INTRAMUSCULAR | Status: AC | PRN
Start: 2013-11-12 — End: 2013-11-12

## 2013-11-12 MED ORDER — heparin lock flush 100 unit/mL Syrg 500 Units
100 | Freq: Every day | INTRAVENOUS | Status: AC | PRN
Start: 2013-11-12 — End: 2013-11-12

## 2013-11-12 MED ORDER — diphenhydrAMINE (BENADRYL) capsule 25 mg
25 | Freq: Once | ORAL | Status: AC
Start: 2013-11-12 — End: 2013-11-12
  Administered 2013-11-12: 17:00:00 25 mg via ORAL

## 2013-11-12 MED ORDER — sodium chloride 0.9 % infusion
Freq: Every day | INTRAVENOUS | Status: AC | PRN
Start: 2013-11-12 — End: 2013-11-12
  Administered 2013-11-12: 17:00:00 25 mL/h via INTRAVENOUS

## 2013-11-12 MED ORDER — acetaminophen (TYLENOL) tablet 650 mg
325 | Freq: Once | ORAL | Status: AC
Start: 2013-11-12 — End: 2013-11-12
  Administered 2013-11-12: 17:00:00 650 mg via ORAL

## 2013-11-12 MED ORDER — fluticasone-salmeterol (ADVAIR) 250-50 mcg/dose diskus inhaler
250-50 | Freq: Two times a day (BID) | RESPIRATORY_TRACT | 2.00 refills | 30.00000 days | Status: AC
Start: 2013-11-12 — End: 2014-08-07

## 2013-11-12 MED ORDER — EPINEPHrine (ADRENALIN) 1 mg/mL (1:1,000) injection 0.3 mg
1 | Freq: Every day | INTRAMUSCULAR | Status: AC | PRN
Start: 2013-11-12 — End: 2013-11-12

## 2013-11-12 MED ORDER — immune globulin (human) (IgG) (GAMMAGARD) Soln 25 g
10 | Freq: Once | INTRAVENOUS | Status: AC
Start: 2013-11-12 — End: 2013-11-12
  Administered 2013-11-12: 17:00:00 25 g/kg via INTRAVENOUS

## 2013-11-12 MED FILL — TYLENOL 325 MG TABLET: 325 325 mg | ORAL | Qty: 2

## 2013-11-12 MED FILL — GAMMAGARD LIQUID 10 % INJECTION SOLUTION: 10 10 % | INTRAMUSCULAR | Qty: 250

## 2013-11-12 MED FILL — DIPHENHYDRAMINE 25 MG CAPSULE: 25 25 mg | ORAL | Qty: 1

## 2013-11-12 NOTE — Unmapped (Signed)
See dictated note

## 2013-11-12 NOTE — Unmapped (Signed)
IVIG  D:We go to [place] during the winter and I will get my next dose down there.  A:Here today for IVIG treatment. Aware of plan today and in agreement. Fall risk assessment completed. Ambulates with steady gait.   R:Tolerated well and completed infusion. IV discontinued and patient discharged. Patient states that she is aware how to get next appointment scheduled in [place]. Discharged to home ambulatory.

## 2013-11-14 NOTE — Unmapped (Signed)
DICTATED ADDENDUM.  PROVIDER TO REVIEW.    DIAGNOSIS(ES):    1. Breast cancer T1N0M0, triple-negative.   2. NonHodgkin's lymphoma.   3. History of IgG deficiency.      Ashley Madden is here for continuation of IVIG.  Overall she is doing well.  She and her husband have a 2-week vacation trip to [place] planned starting the end of the week.  She has had no intercurrent signs of infection, GI, GU, pulmonary, cardiac, neurologic, musculoskeletal, cutaneous, endocrine, psychiatric or other major difficulties.  In general she feels like things have been going along fairly well.  She states that she needs flu shot which will be given to her today.  She also will take Advair with her.  Her GI tract is better as long as she is using her Bentyl and she does have plenty of medications.  Remainder of a 10-point review of systems is otherwise unchanged.    PHYSICAL EXAMINATION:  Vital signs per flow sheet.  She is alert, oriented and otherwise in no acute distress.  Her throat is clear without ulceration or Candida.  Lungs are clear.  Cardiac exam- no murmurs, rubs or gallops.  No cervical, supra, infraclavicular adenopathy.  Chest wall is unchanged.  Abdomen is soft without ascites, jaundice or organomegaly.  There is no lower extremity edema.  She is not clubbed.  Skin is without rash or ecchymoses.  Musculoskeletal- no active arthritis or fracture.  Neurologic/mental status- alert and oriented.  Cranial nerves grossly intact.  Motor and sensory grossly unchanged.  Gait is unremarkable.  Psychiatric- no overt anxiety or depression.      Laboratory tests all per flow sheet.    IMPRESSION:  Clinically doing well.    PLAN:    1. Patient will continue with current IVIG.   2. Flu vaccine.  3. Survivorship issues addressed including healthy lifestyle.   4. Bone density reviewed.  It is essentially unchanged.   5. Patient's back pain appears stable.  She has had lymphoma in her back before and I did caution her that if her symptoms do  not improve or stabilize with the Advil that she should let us know and an MRI of the spine will be set up.    6. Questions were addressed.    EEL/tlm   [id number]

## 2013-12-25 NOTE — Unmapped (Signed)
Patient now in [place] to continue her IVIG treatments till May of 2016.  Patient requested that records from May 2015 through present be faxed to her physician since she is receiving treatment tomorrow morning.  Progress notes and treatment records faxed to [phone].

## 2014-03-22 MED ORDER — atenolol (TENORMIN) 50 MG tablet
50 | ORAL_TABLET | ORAL | 0.00 refills | 30.00000 days | Status: AC
Start: 2014-03-22 — End: 2014-05-23

## 2014-05-24 MED ORDER — atenolol (TENORMIN) 50 MG tablet
50 | ORAL_TABLET | ORAL | 0.00 refills | 30.00000 days | Status: AC
Start: 2014-05-24 — End: 2014-10-15

## 2014-06-26 ENCOUNTER — Ambulatory Visit: Admit: 2014-06-26 | Payer: MEDICARE

## 2014-06-26 ENCOUNTER — Other Ambulatory Visit: Admit: 2014-06-26 | Payer: MEDICARE

## 2014-06-26 ENCOUNTER — Ambulatory Visit: Admit: 2014-06-26 | Payer: MEDICARE | Attending: Hematology & Oncology

## 2014-06-26 DIAGNOSIS — D803 Selective deficiency of immunoglobulin G [IgG] subclasses: Secondary | ICD-10-CM

## 2014-06-26 DIAGNOSIS — D839 Common variable immunodeficiency, unspecified: Secondary | ICD-10-CM

## 2014-06-26 LAB — CBC
Hematocrit: 40.1 % (ref 35.0–45.0)
Hemoglobin: 13.1 g/dL (ref 11.7–15.5)
MCH: 28.8 pg (ref 27.0–33.0)
MCHC: 32.6 g/dL (ref 32.0–36.0)
MCV: 88.2 fL (ref 80.0–100.0)
MPV: 10.8 fL (ref 7.5–11.5)
Platelets: 115 10*3/uL — ABNORMAL LOW (ref 140–400)
RBC: 4.54 10*6/uL (ref 3.80–5.10)
RDW: 15.3 % — ABNORMAL HIGH (ref 11.0–15.0)
WBC: 3.4 10*3/uL — ABNORMAL LOW (ref 3.8–10.8)

## 2014-06-26 LAB — COMPREHENSIVE METABOLIC PANEL
ALT: 14 U/L (ref 7–52)
AST: 19 U/L (ref 13–39)
Albumin: 4 g/dL (ref 3.5–5.7)
Alkaline Phosphatase: 74 U/L (ref 36–125)
Anion Gap: 4 mmol/L (ref 3–16)
BUN: 17 mg/dL (ref 7–25)
CO2: 30 mmol/L (ref 21–33)
Calcium: 9.4 mg/dL (ref 8.6–10.3)
Chloride: 106 mmol/L (ref 98–110)
Creatinine: 0.65 mg/dL (ref 0.60–1.30)
Glucose: 111 mg/dL — ABNORMAL HIGH (ref 70–100)
Osmolality, Calculated: 292 mosm/kg (ref 278–305)
Potassium: 3.9 mmol/L (ref 3.5–5.3)
Sodium: 140 mmol/L (ref 133–146)
Total Bilirubin: 0.7 mg/dL (ref 0.0–1.5)
Total Protein: 6.2 g/dL — ABNORMAL LOW (ref 6.4–8.9)
eGFR AA CKD-EPI: >90 See note.
eGFR NONAA CKD-EPI: 83 See note.

## 2014-06-26 LAB — DIFFERENTIAL
Basophils Absolute: 7 /uL (ref 0–200)
Basophils Relative: 0.2 % (ref 0.0–1.0)
Eosinophils Absolute: 58 /uL (ref 15–500)
Eosinophils Relative: 1.7 % (ref 0.0–8.0)
Lymphocytes Absolute: 551 /uL (ref 850–3900)
Lymphocytes Relative: 16.2 % (ref 15.0–45.0)
Monocytes Absolute: 422 /uL (ref 200–950)
Monocytes Relative: 12.4 % (ref 0.0–12.0)
Neutrophils Absolute: 2363 /uL (ref 1500–7800)
Neutrophils Relative: 69.5 % (ref 40.0–80.0)

## 2014-06-26 MED ORDER — hydrocortisone sod succ (PF)(SOLU-CORTEF) 100 mg injection
100 | Freq: Every day | INTRAMUSCULAR | Status: AC | PRN
Start: 2014-06-26 — End: 2014-06-26

## 2014-06-26 MED ORDER — immuneglobulinhumanIgGGAMMAGARDSoln25g
10 | Freq: Once | INTRAMUSCULAR | Status: AC
Start: 2014-06-26 — End: 2014-06-26
  Administered 2014-06-26: 19:00:00 25 g/kg via INTRAVENOUS

## 2014-06-26 MED ORDER — sodium chloride 0.9 % flush 10 mL
Freq: Every day | INTRAMUSCULAR | Status: AC | PRN
Start: 2014-06-26 — End: 2014-06-26

## 2014-06-26 MED ORDER — heparin lock flush 100 unit/mL Syrg 500 Units
100 | Freq: Every day | INTRAVENOUS | Status: AC | PRN
Start: 2014-06-26 — End: 2014-06-26

## 2014-06-26 MED ORDER — diphenhydrAMINE (BENADRYL) capsule 25 mg
25 | Freq: Once | ORAL | Status: AC
Start: 2014-06-26 — End: 2014-06-26
  Administered 2014-06-26: 19:00:00 25 mg via ORAL

## 2014-06-26 MED ORDER — acetaminophen (TYLENOL) tablet 650 mg
325 | Freq: Once | ORAL | Status: AC
Start: 2014-06-26 — End: 2014-06-26
  Administered 2014-06-26: 19:00:00 650 mg via ORAL

## 2014-06-26 MED ORDER — albuterol (PROVENTIL;VENTOLIN;PROAIR) inhaler 1-2 puff
90 | RESPIRATORY_TRACT | Status: AC | PRN
Start: 2014-06-26 — End: 2014-06-26

## 2014-06-26 MED ORDER — EPINEPHrine (ADRENALIN) 1 mg/mL injection 0.3 mg
1 | Freq: Every day | INTRAMUSCULAR | Status: AC | PRN
Start: 2014-06-26 — End: 2014-06-26

## 2014-06-26 MED ORDER — diphenhydrAMINE (BENADRYL) injection 25 mg
50 | Freq: Every day | INTRAMUSCULAR | Status: AC | PRN
Start: 2014-06-26 — End: 2014-06-26

## 2014-06-26 MED ORDER — sodium chloride 0.9 % infusion
Freq: Every day | INTRAVENOUS | Status: AC | PRN
Start: 2014-06-26 — End: 2014-06-26
  Administered 2014-06-26: 19:00:00 25 mL/h via INTRAVENOUS

## 2014-06-26 MED FILL — TYLENOL 325 MG TABLET: 325 325 mg | ORAL | Qty: 2

## 2014-06-26 MED FILL — GAMMAGARD LIQUID 10 % INJECTION SOLUTION: 10 10 % | INTRAMUSCULAR | Qty: 250

## 2014-06-26 MED FILL — DIPHENHYDRAMINE 25 MG CAPSULE: 25 25 mg | ORAL | Qty: 1

## 2014-06-26 NOTE — Unmapped (Signed)
After MD appt, pt here for IVIG. Pt not a fall risk on assessment.See MD visit note for Vitals, Allergy and Medication review. At 1715, IVIG paused due to hypertension and Normal Saline infusing at 50 ml/hr. Pt denies any complaints.  P.Adams, NP notified and normal saline to continue. At 1730. P. Adams NP chairside to see pt as remains hypertensive . At 1800. Per Dr. Samara Snide pt to restart IVIG and follow up with PCP regarding hypertension. Pt voices understanding. Pt completed IVIG, discharged ambulatory, given AVS.

## 2014-06-26 NOTE — Unmapped (Signed)
See dictated note

## 2014-06-27 NOTE — Unmapped (Signed)
DICTATED ADDENDUM.  PROVIDER TO REVIEW     DIAGNOSIS(ES):    1. IgG deficiency.    2. History of non-Hodgkin's lymphoma.    3. History of breast cancer.      [name] is here for follow-up.  Overall she is doing actually quite well.  She continued to require IVIG in [place] and we will check her levels when she returns.  She had no infections through the winter so she is very pleased with that.  She felt that in general very healthy.  She has a little bit of back issues and she still has her chronic bowel problems, but for the most part she felt things went very well.  She is up-to-date with other testing.  A DEXA scan was performed in October, mammogram in September.  Those results were again reviewed with her.  She and her family have multiple travel plans coming up including a cruise down the river from [place] to [place].  No other new GI, GU, pulmonary, cardiac, neurologic, musculoskeletal, cutaneous, endocrine, infectious disease, psychiatric, hematologic or other major difficulties.    PHYSICAL EXAMINATION:  Vital signs per flow sheet.  She is alert, oriented and otherwise in no acute distress.  Her throat is clear without ulceration or Candida.  Lungs are clear.  Cardiac exam- no murmur, rub or gallop.  Chest wall-  Mastectomy site is negative.  Contralateral breast is unremarkable.  Abdomen is soft without ascites, jaundice or organomegaly.  There is no lower extremity edema.  She is not clubbed.  Skin does show some scattered keratoses.  No ecchymoses, no signs of skin cancer.  Abdomen is soft without ascites, jaundice or organomegaly.  There is no lower extremity edema.  She is not clubbed.  Skin is without rash or ecchymoses.  Musculoskeletal- No active arthritis or fracture.  Neurologic- Mental status, alert and oriented.  Cranial nerves grossly intact.  Motor and sensory grossly unchanged.  Gait is unremarkable.  Psychiatric- She is not overly anxious or depressed.      Laboratory tests all per  flow sheet.    IMPRESSION:  Doing well.    PLAN:    1. Patient will continue with IVIG today.    2. We did discuss survivorship issues and healthy habits, healthy lifestyle.  3. Mammogram will be performed in the fall.  4. SPEP and quantitative immunoglobulins will be ordered around her next return, which will be in about 6 weeks.    5. She will contact us for additional problems.    EEL/nwc   [id number]

## 2014-08-07 ENCOUNTER — Ambulatory Visit: Admit: 2014-08-07 | Payer: MEDICARE | Attending: Hematology & Oncology

## 2014-08-07 ENCOUNTER — Ambulatory Visit: Admit: 2014-08-07 | Payer: MEDICARE

## 2014-08-07 ENCOUNTER — Other Ambulatory Visit: Admit: 2014-08-07 | Payer: MEDICARE

## 2014-08-07 DIAGNOSIS — C50919 Malignant neoplasm of unspecified site of unspecified female breast: Secondary | ICD-10-CM

## 2014-08-07 DIAGNOSIS — C50912 Malignant neoplasm of unspecified site of left female breast: Secondary | ICD-10-CM

## 2014-08-07 DIAGNOSIS — D803 Selective deficiency of immunoglobulin G [IgG] subclasses: Secondary | ICD-10-CM

## 2014-08-07 LAB — DIFFERENTIAL
Basophils Absolute: 16 /uL (ref 0–200)
Basophils Relative: 0.4 % (ref 0.0–1.0)
Eosinophils Absolute: 82 /uL (ref 15–500)
Eosinophils Relative: 2.1 % (ref 0.0–8.0)
Lymphocytes Absolute: 597 /uL — ABNORMAL LOW (ref 850–3900)
Lymphocytes Relative: 15.3 % (ref 15.0–45.0)
Monocytes Absolute: 612 /uL (ref 200–950)
Monocytes Relative: 15.7 % — ABNORMAL HIGH (ref 0.0–12.0)
Neutrophils Absolute: 2594 /uL (ref 1500–7800)
Neutrophils Relative: 66.5 % (ref 40.0–80.0)
nRBC: 0 /100{WBCs} (ref 0–0)

## 2014-08-07 LAB — COMPREHENSIVE METABOLIC PANEL
ALT: 11 U/L (ref 7–52)
AST: 16 U/L (ref 13–39)
Albumin: 3.7 g/dL (ref 3.5–5.7)
Alkaline Phosphatase: 66 U/L (ref 36–125)
Anion Gap: 8 mmol/L (ref 3–16)
BUN: 20 mg/dL (ref 7–25)
CO2: 25 mmol/L (ref 21–33)
Calcium: 9.1 mg/dL (ref 8.6–10.3)
Chloride: 103 mmol/L (ref 98–110)
Creatinine: 0.67 mg/dL (ref 0.60–1.30)
Glucose: 96 mg/dL (ref 70–100)
Osmolality, Calculated: 284 mOsm/kg (ref 278–305)
Potassium: 4.1 mmol/L (ref 3.5–5.3)
Sodium: 136 mmol/L (ref 133–146)
Total Bilirubin: 0.8 mg/dL (ref 0.0–1.5)
Total Protein: 6.1 g/dL (ref 6.4–8.9)
eGFR AA CKD-EPI: >90 See note.
eGFR NONAA CKD-EPI: 82 See note.

## 2014-08-07 LAB — KAPPA/LAMBDA, FREE LIGHT CHAINS, SERUM
Kappa/Lambda Ratio: 0.9 ratio (ref 0.26–1.65)
Kappa: 11.3 mg/L (ref 3.3–19.4)
Lambda: 12.6 mg/L (ref 5.7–26.3)

## 2014-08-07 LAB — CBC
Hematocrit: 38.3 % (ref 35.0–45.0)
Hemoglobin: 12.4 g/dL (ref 11.7–15.5)
MCH: 28.5 pg (ref 27.0–33.0)
MCHC: 32.5 g/dL (ref 32.0–36.0)
MCV: 87.7 fL (ref 80.0–100.0)
MPV: 10.3 fL (ref 7.5–11.5)
Platelets: 118 10*3/uL (ref 140–400)
RBC: 4.37 10*6/uL (ref 3.80–5.10)
RDW: 15.1 % (ref 11.0–15.0)
WBC: 3.9 10*3/uL (ref 3.8–10.8)

## 2014-08-07 LAB — SERUM PROT ELECTROPHORESIS,RFX IT
Albumin, Protein Electrophoresis: 3.7 g/dL (ref 3.70–4.90)
Alpha 1: 0.3 g/dL (ref 0.20–0.40)
Alpha 2: 0.6 g/dL (ref 0.50–1.00)
Beta: 0.6 g/dL (ref 0.60–1.00)
Gamma Globulin: 0.7 g/dL (ref 0.50–1.50)
Kappa/Lambda Ratio: 0.92 ratio (ref 0.26–1.65)
Kappa: 9.9 mg/L (ref 3.3–19.4)
Lambda: 10.8 mg/L (ref 5.7–26.3)
M Spike: 0 g/dL
Total Protein (PE): 5.9 g/dL (ref 6.4–8.9)

## 2014-08-07 LAB — IGM: IgM: 69.1 mg/dL (ref 46.0–304.0)

## 2014-08-07 LAB — IGG: IgG: 702 mg/dL (ref 751.0–1560.0)

## 2014-08-07 LAB — IGA: IgA: 59.4 mg/dL (ref 82.0–453.0)

## 2014-08-07 MED ORDER — diphenhydrAMINE (BENADRYL) injection 25 mg
50 | Freq: Every day | INTRAMUSCULAR | Status: AC | PRN
Start: 2014-08-07 — End: 2014-08-07

## 2014-08-07 MED ORDER — fluticasone-salmeterol (ADVAIR) 250-50 mcg/dose diskus inhaler
250-50 | Freq: Two times a day (BID) | RESPIRATORY_TRACT | 2.00 refills | 30.00000 days | Status: AC
Start: 2014-08-07 — End: 2015-10-01

## 2014-08-07 MED ORDER — albuterol (PROVENTIL;VENTOLIN;PROAIR) inhaler 1-2 puff
90 | RESPIRATORY_TRACT | Status: AC | PRN
Start: 2014-08-07 — End: 2014-08-07

## 2014-08-07 MED ORDER — diphenhydrAMINE (BENADRYL) capsule 25 mg
25 | Freq: Once | ORAL | Status: AC
Start: 2014-08-07 — End: 2014-08-07
  Administered 2014-08-07: 18:00:00 25 mg via ORAL

## 2014-08-07 MED ORDER — sodium chloride 0.9 % infusion
Freq: Every day | INTRAVENOUS | Status: AC | PRN
Start: 2014-08-07 — End: 2014-08-07
  Administered 2014-08-07: 18:00:00 25 mL/h via INTRAVENOUS

## 2014-08-07 MED ORDER — immune globulin (human) (IgG) (GAMMAGARD) Soln 25 g
10 | Freq: Once | INTRAMUSCULAR | Status: AC
Start: 2014-08-07 — End: 2014-08-07
  Administered 2014-08-07: 18:00:00 25 g/kg via INTRAVENOUS

## 2014-08-07 MED ORDER — EPINEPHrine (ADRENALIN) 1 mg/mL injection 0.3 mg
1 | Freq: Every day | INTRAMUSCULAR | Status: AC | PRN
Start: 2014-08-07 — End: 2014-08-07

## 2014-08-07 MED ORDER — sodium chloride 0.9 % flush 10 mL
Freq: Every day | INTRAMUSCULAR | Status: AC | PRN
Start: 2014-08-07 — End: 2014-08-07

## 2014-08-07 MED ORDER — hydrocortisone sod succ (PF)(SOLU-CORTEF) 100 mg injection
100 | Freq: Every day | INTRAMUSCULAR | Status: AC | PRN
Start: 2014-08-07 — End: 2014-08-07

## 2014-08-07 MED ORDER — acetaminophen (TYLENOL) tablet 650 mg
325 | Freq: Once | ORAL | Status: AC
Start: 2014-08-07 — End: 2014-08-07
  Administered 2014-08-07: 12:00:00 650 mg via ORAL

## 2014-08-07 MED ORDER — heparin lock flush 100 unit/mL Syrg 500 Units
100 | Freq: Every day | INTRAVENOUS | Status: AC | PRN
Start: 2014-08-07 — End: 2014-08-07

## 2014-08-07 MED FILL — GAMMAGARD LIQUID 10 % INJECTION SOLUTION: 10 10 % | INTRAMUSCULAR | Qty: 250

## 2014-08-07 NOTE — Unmapped (Signed)
See dictated note

## 2014-08-07 NOTE — Unmapped (Signed)
After MD appt, pt here for IVIG. See MD visit note for Vitals, Allergy and Medication review. Labs verified to be in normal range for ordered treatment. Pt tolerated treatment without difficulty, AVS given and next appointment reviewed.  Pt left ambulatory. PIV flushed with 0.9% normal saline and removed with angio intact.

## 2014-08-08 NOTE — Unmapped (Signed)
DICTATED ADDENDUM.  PROVIDER TO REVIEW.    DIAGNOSIS(ES):    1. Left-sided breast cancer T1N0M0, nonHodgkin's lymphoma.   2. History of IgG deficiency.      Ashley Madden is here for follow-up.  She is accompanied today by her husband.  Overall she looks and feels well.  She has had no real intercurrent infections and she is very pleased with this because unfortunately her husband developed a horrible cold while she and her family were abroad.  She is having some leg issues.  She has an area that looks like it was skin cancer that may be superinfected.  She certainly does need to go see the dermatologist.  She does have two dermatologists that she has seen in the past.  She tolerated her IVIG last time fairly well.  She did have an elevation in her blood pressure. When she went to see her PCP everything was back to normal and she is monitoring it at home.  She has had no other GI, GU, pulmonary, cardiac, neurologic, musculoskeletal, cutaneous, endocrine, psychiatric, hematologic or other major problems.  For the most part she feels that things are going along without major complication.  She is due for mammogram in September which has been ordered for her. IgGs were also ordered today.  Remainder of a 10-point review of systems is unchanged.  No other GI, GU, pulmonary, cardiac, neurologic, musculoskeletal, cutaneous, endocrine, psychiatric, hematologic or other major difficulties.    PHYSICAL EXAMINATION:  Vital signs per flow sheet.  She is alert, oriented and otherwise in no acute distress.  Throat is clear without ulceration or Candida.  Lungs are clear.  Cardiac exam- no murmur, rub or gallop.  No cervical, supra, infraclavicular adenopathy.  Breast exam was not performed.  Abdomen is soft without ascites, jaundice or organomegaly.  She has no lower extremity edema.  She is not clubbed.  Skin shows a couple of areas of concern. One is on her left leg distally and the other is more proximally; both of them look like  possible skin cancer.  She will see dermatology.  Musculoskeletal- no active arthritis, no overt fracture.  Neurologic, mental status alert and oriented.  Cranial nerves grossly intact.  Motor and sensory is unchanged.  Gait is unremarkable.  She does not use an assist device.  Psychiatric, she is not overly anxious or depressed.      Laboratory tests all per flow sheet.    IMPRESSION:  Doing well.    PLAN:    1. Patient will continue with current regimen of IVIG.  2. She will see dermatology regarding her skin lesions.    3. We did discuss some of her infection issues.  She will certainly let us know if she has additional problems.    4. We also discussed breast cancer survivorship, healthy habits, healthy lifestyle.   5. Mammogram for the end of September has also been ordered.    6. She will return as noted.    EEL/tlm   [id number]

## 2014-08-19 NOTE — Unmapped (Signed)
[facility] called pt regarding mammo appointment. Appointment was scheduled for 10/21/2014 @ 8:40am. Pt must arrive by 8:25am. Location: [facility] Prep: No lotions, oils, powders, perfumes in the upper body area. Pt verbalized understanding and confirmed.

## 2014-09-18 ENCOUNTER — Ambulatory Visit: Admit: 2014-09-18 | Payer: MEDICARE | Attending: Hematology & Oncology

## 2014-09-18 ENCOUNTER — Ambulatory Visit: Admit: 2014-09-18 | Payer: MEDICARE

## 2014-09-18 ENCOUNTER — Other Ambulatory Visit: Admit: 2014-09-18 | Payer: MEDICARE

## 2014-09-18 DIAGNOSIS — D803 Selective deficiency of immunoglobulin G [IgG] subclasses: Secondary | ICD-10-CM

## 2014-09-18 DIAGNOSIS — C50919 Malignant neoplasm of unspecified site of unspecified female breast: Secondary | ICD-10-CM

## 2014-09-18 LAB — CBC
Hematocrit: 37.6 % (ref 35.0–45.0)
Hemoglobin: 12.8 g/dL (ref 11.7–15.5)
MCH: 29.1 pg (ref 27.0–33.0)
MCHC: 34.1 g/dL (ref 32.0–36.0)
MCV: 85.3 fL (ref 80.0–100.0)
MPV: 10.4 fL (ref 7.5–11.5)
Platelets: 114 10*3/uL (ref 140–400)
RBC: 4.42 10*6/uL (ref 3.80–5.10)
RDW: 15 % (ref 11.0–15.0)
WBC: 3.9 10*3/uL (ref 3.8–10.8)

## 2014-09-18 LAB — COMPREHENSIVE METABOLIC PANEL
ALT: 11 U/L (ref 7–52)
AST: 17 U/L (ref 13–39)
Albumin: 4 g/dL (ref 3.5–5.7)
Alkaline Phosphatase: 68 U/L (ref 36–125)
Anion Gap: 8 mmol/L (ref 3–16)
BUN: 17 mg/dL (ref 7–25)
CO2: 27 mmol/L (ref 21–33)
Calcium: 9.1 mg/dL (ref 8.6–10.3)
Chloride: 105 mmol/L (ref 98–110)
Creatinine: 0.64 mg/dL (ref 0.60–1.30)
Glucose: 97 mg/dL (ref 70–100)
Osmolality, Calculated: 291 mOsm/kg (ref 278–305)
Potassium: 3.8 mmol/L (ref 3.5–5.3)
Sodium: 140 mmol/L (ref 133–146)
Total Bilirubin: 0.8 mg/dL (ref 0.0–1.5)
Total Protein: 6.2 g/dL (ref 6.4–8.9)
eGFR AA CKD-EPI: >90 See note.
eGFR NONAA CKD-EPI: 83 See note.

## 2014-09-18 MED ORDER — immune globulin (human) (IgG) (GAMMAGARD) Soln 25 g
10 | Freq: Once | INTRAMUSCULAR | Status: AC
Start: 2014-09-18 — End: 2014-09-18
  Administered 2014-09-18: 17:00:00 25 g/kg via INTRAVENOUS

## 2014-09-18 MED ORDER — sodium chloride 0.9 % infusion
Freq: Every day | INTRAVENOUS | Status: AC | PRN
Start: 2014-09-18 — End: 2014-09-18

## 2014-09-18 MED ORDER — sodium chloride 0.9 % flush 10 mL
Freq: Every day | INTRAMUSCULAR | Status: AC | PRN
Start: 2014-09-18 — End: 2014-09-18

## 2014-09-18 MED ORDER — diphenhydrAMINE (BENADRYL) injection 25 mg
50 | Freq: Every day | INTRAMUSCULAR | Status: AC | PRN
Start: 2014-09-18 — End: 2014-09-18

## 2014-09-18 MED ORDER — heparin lock flush 100 unit/mL Syrg 500 Units
100 | Freq: Every day | INTRAVENOUS | Status: AC | PRN
Start: 2014-09-18 — End: 2014-09-18

## 2014-09-18 MED ORDER — albuterol (PROVENTIL;VENTOLIN;PROAIR) inhaler 1-2 puff
90 | RESPIRATORY_TRACT | Status: AC | PRN
Start: 2014-09-18 — End: 2014-09-18

## 2014-09-18 MED ORDER — hydrocortisone sod succ (PF)(SOLU-CORTEF) 100 mg injection
100 | Freq: Every day | INTRAMUSCULAR | Status: AC | PRN
Start: 2014-09-18 — End: 2014-09-18

## 2014-09-18 MED ORDER — diphenhydrAMINE (BENADRYL) capsule 25 mg
25 | Freq: Once | ORAL | Status: AC
Start: 2014-09-18 — End: 2014-09-18
  Administered 2014-09-18: 17:00:00 25 mg via ORAL

## 2014-09-18 MED ORDER — EPINEPHrine (ADRENALIN) 1 mg/mL injection 0.3 mg
1 | Freq: Every day | INTRAMUSCULAR | Status: AC | PRN
Start: 2014-09-18 — End: 2014-09-18

## 2014-09-18 MED ORDER — acetaminophen (TYLENOL) tablet 650 mg
325 | Freq: Once | ORAL | Status: AC
Start: 2014-09-18 — End: 2014-09-18
  Administered 2014-09-18: 17:00:00 650 mg via ORAL

## 2014-09-18 MED FILL — GAMMAGARD LIQUID 10 % INJECTION SOLUTION: 10 10 % | INTRAMUSCULAR | Qty: 250

## 2014-09-18 MED FILL — DIPHENHYDRAMINE 25 MG CAPSULE: 25 25 mg | ORAL | Qty: 1

## 2014-09-18 MED FILL — TYLENOL 325 MG TABLET: 325 325 mg | ORAL | Qty: 2

## 2014-09-18 NOTE — Unmapped (Signed)
Patient is in treatment area for Day 1 Cycle 18 of IVIG.  Peripheral IV started. NS infusing. PO premeds given by Alyse Low RN. IVIG infused. Appropriate VS taken and recorded. Pt tolerated treatment without difficulty. Discharged ambulatory.Port flushed with NS 10ml and 5 ml Heparin 100 units/ml. Positive blood return noted. AVS given.

## 2014-09-18 NOTE — Unmapped (Signed)
See dictated ntoe

## 2014-09-18 NOTE — Unmapped (Signed)
After MD appt, pt here for IVIG. See MD visit note for Vitals, Allergy and Medication review.Pt not a fall risk on assessment.

## 2014-09-19 NOTE — Unmapped (Signed)
DICTATED ADDENDUM.  PROVIDER TO REVIEW     DIAGNOSIS(ES):    1. Breast cancer, T1, N0, M0.    2. Non-Hodgkin's lymphoma.    3. History of acquired IgG deficiency.      Ashley Madden is here for IVIG.  Overall she is doing pretty well.  Her main complaint still centers around her IBS.  She did see her gastroenterologist, Dr. Philis Nettle, who gave her some fiber and things to try.  She gets frustrated.  It really impacts her ability to travel, eat out, etc.  She has had no fevers, chills or signs of infection.  We did check her IgG level at her last visit and unfortunately her level was still low.  I have told her I think she at least needs to keep up with every 6-week infusion, if not monthly.  She has had no infections recently, although occasionally she does get some skin issues.  She feels tired on occasion.  No new GI, GU, pulmonary, cardiac, neurologic, musculoskeletal, cutaneous, endocrine, psychiatric, hematologic or other major difficulties.  Remainder of a 10-point review of systems is otherwise unchanged.    PHYSICAL EXAMINATION:  Vital signs per flow sheet.  She is alert, oriented and otherwise in no acute distress.  Throat is clear without ulceration or Candida.  Lungs are clear.  Cardiac exam- No murmur, rub or gallop.  No cervical, supra, infraclavicular adenopathy.  Chest wall is unchanged.  Abdomen is soft without ascites, jaundice or organomegaly.  There is no lower extremity edema.  She is not clubbed.  Skin shows no rash or ecchymoses.  Musculoskeletal-  No active arthritis or fracture.  Neurologic- Mental status, alert and oriented.  Cranial nerves unchanged.  Gait is unremarkable.  She does not use an assist device.  Psychiatric-  She is not overly anxious or depressed.      Laboratory tests all per flow sheet.    IMPRESSION:  Doing fairly well.    PLAN:    1. Patient will continue IVIG.  2. Survivorship issues including healthy habits, healthy lifestyle discussed.    3. Copies of blood work given.    4. We  did discuss her GI issues.  She will let us know if we can be of additional assistance.    EEL/nwc   [id number]

## 2014-10-09 ENCOUNTER — Ambulatory Visit: Admit: 2014-10-09 | Payer: MEDICARE | Attending: Hematology & Oncology

## 2014-10-09 ENCOUNTER — Other Ambulatory Visit: Admit: 2014-10-09 | Payer: MEDICARE

## 2014-10-09 ENCOUNTER — Ambulatory Visit: Admit: 2014-10-09 | Payer: MEDICARE

## 2014-10-09 DIAGNOSIS — C50919 Malignant neoplasm of unspecified site of unspecified female breast: Secondary | ICD-10-CM

## 2014-10-09 DIAGNOSIS — D839 Common variable immunodeficiency, unspecified: Secondary | ICD-10-CM

## 2014-10-09 DIAGNOSIS — D803 Selective deficiency of immunoglobulin G [IgG] subclasses: Secondary | ICD-10-CM

## 2014-10-09 LAB — CBC
Hematocrit: 36.5 % (ref 35.0–45.0)
Hemoglobin: 12.7 g/dL (ref 11.7–15.5)
MCH: 29.3 pg (ref 27.0–33.0)
MCHC: 34.8 g/dL (ref 32.0–36.0)
MCV: 84.2 fL (ref 80.0–100.0)
MPV: 10.3 fL (ref 7.5–11.5)
Platelets: 119 10*3/uL (ref 140–400)
RBC: 4.34 10*6/uL (ref 3.80–5.10)
RDW: 14.4 % (ref 11.0–15.0)
WBC: 4 10*3/uL (ref 3.8–10.8)

## 2014-10-09 LAB — COMPREHENSIVE METABOLIC PANEL
ALT: 12 U/L (ref 7–52)
AST: 16 U/L (ref 13–39)
Albumin: 3.9 g/dL (ref 3.5–5.7)
Alkaline Phosphatase: 70 U/L (ref 36–125)
Anion Gap: 7 mmol/L (ref 3–16)
BUN: 15 mg/dL (ref 7–25)
CO2: 30 mmol/L (ref 21–33)
Calcium: 9.2 mg/dL (ref 8.6–10.3)
Chloride: 104 mmol/L (ref 98–110)
Creatinine: 0.62 mg/dL (ref 0.60–1.30)
Glucose: 109 mg/dL — ABNORMAL HIGH (ref 70–100)
Osmolality, Calculated: 293 mosm/kg (ref 278–305)
Potassium: 3.5 mmol/L (ref 3.5–5.3)
Sodium: 141 mmol/L (ref 133–146)
Total Bilirubin: 0.7 mg/dL (ref 0.0–1.5)
Total Protein: 6.4 g/dL (ref 6.4–8.9)
eGFR AA CKD-EPI: >90 See note.
eGFR NONAA CKD-EPI: 84 See note.

## 2014-10-09 MED ORDER — sodium chloride 0.9 % infusion
Freq: Every day | INTRAVENOUS | Status: AC | PRN
Start: 2014-10-09 — End: 2014-10-09
  Administered 2014-10-09: 18:00:00 25 mL/h via INTRAVENOUS

## 2014-10-09 MED ORDER — EPINEPHrine (ADRENALIN) 1 mg/mL injection 0.3 mg
1 | Freq: Every day | INTRAMUSCULAR | Status: AC | PRN
Start: 2014-10-09 — End: 2014-10-09

## 2014-10-09 MED ORDER — heparin lock flush 100 unit/mL Syrg 500 Units
100 | Freq: Every day | INTRAVENOUS | Status: AC | PRN
Start: 2014-10-09 — End: 2014-10-09

## 2014-10-09 MED ORDER — sodium chloride 0.9 % flush 10 mL
Freq: Every day | INTRAMUSCULAR | Status: AC | PRN
Start: 2014-10-09 — End: 2014-10-09

## 2014-10-09 MED ORDER — albuterol (PROVENTIL;VENTOLIN;PROAIR) inhaler 1-2 puff
90 | RESPIRATORY_TRACT | Status: AC | PRN
Start: 2014-10-09 — End: 2014-10-09

## 2014-10-09 MED ORDER — diphenhydrAMINE (BENADRYL) capsule 25 mg
25 | Freq: Once | ORAL | Status: AC
Start: 2014-10-09 — End: 2014-10-09
  Administered 2014-10-09: 18:00:00 25 mg via ORAL

## 2014-10-09 MED ORDER — diphenhydrAMINE (BENADRYL) injection 25 mg
50 | Freq: Every day | INTRAMUSCULAR | Status: AC | PRN
Start: 2014-10-09 — End: 2014-10-09

## 2014-10-09 MED ORDER — immune globulin (human) (IgG) (GAMMAGARD) Soln 25 g
10 | Freq: Once | INTRAMUSCULAR | Status: AC
Start: 2014-10-09 — End: 2014-10-09
  Administered 2014-10-09: 18:00:00 25 g/kg via INTRAVENOUS

## 2014-10-09 MED ORDER — acetaminophen (TYLENOL) tablet 650 mg
325 | Freq: Once | ORAL | Status: AC
Start: 2014-10-09 — End: 2014-10-09
  Administered 2014-10-09: 18:00:00 650 mg via ORAL

## 2014-10-09 MED ORDER — hydrocortisone sod succ (PF)(SOLU-CORTEF) 100 mg injection
100 | Freq: Every day | INTRAMUSCULAR | Status: AC | PRN
Start: 2014-10-09 — End: 2014-10-09

## 2014-10-09 MED FILL — GAMMAGARD LIQUID 10 % INJECTION SOLUTION: 10 10 % | INTRAMUSCULAR | Qty: 250

## 2014-10-09 MED FILL — DIPHENHYDRAMINE 25 MG CAPSULE: 25 25 mg | ORAL | Qty: 1

## 2014-10-09 MED FILL — TYLENOL 325 MG TABLET: 325 325 mg | ORAL | Qty: 2

## 2014-10-09 NOTE — Unmapped (Signed)
After MD appt, pt here for IVIG, denies complaints.See MD visit note for Vitals, Allergy and Medication review.Pt not a fall risk on assessment. Labs verified to be in normal range for ordered treatment.  Pt tolerated treatment without difficulty. Discharged ambulatory, given AVS.

## 2014-10-09 NOTE — Unmapped (Signed)
See dictated note

## 2014-10-10 MED ORDER — meloxicam (MOBIC) 7.5 MG tablet
7.5 | ORAL_TABLET | Freq: Every day | ORAL | Status: AC
Start: 2014-10-10 — End: 2015-09-08

## 2014-10-10 NOTE — Unmapped (Signed)
DICTATED ADDENDUM.  PROVIDER TO REVIEW.     DIAGNOSIS(ES):  Breast cancer T1cN0M0, non-Hodgkin's lymphoma.      [name] is here for follow-up.  She is accompanied by her husband.  For the most part she does not feel well today.  She states she is tired.  She is having more and more back pain.  She also describes some pain over her left side.  This is an area that the dermatologist removed a skin cancer.  She has been back to see him and she was told that this was not recurrent disease but just an area of inflammation.  She has not been using anything topical at this time point.  She states everything just hurts.  She has some diffuse bony complaints.  She has tried some over-the-counter Aleve or Advil with some success.  She has not had GI toxicity but she is aware of both GI as well as nephrotoxicity.  Her back is very bothersome to her.  She does have some sciatica with it.  She has not had any imaging of her LS spine as far as I can tell for at least a 3-year time period.  She did have some issues with this previously.  No other cough, shortness of breath, no other GI, GU, pulmonary, infectious disease, cardiac, neurologic, musculoskeletal, cutaneous, endocrine, psychiatric problems.  She still requires IVIG based upon levels.  Laboratory tests otherwise reviewed.      Remainder of a 10-point review of systems is unchanged.    PHYSICAL EXAMINATION:  Vital signs per flow sheet.  She is alert, oriented and otherwise in no acute distress.  Throat is clear without ulceration or Candida.  Lungs are pretty clear.  Cardiac exam:  No murmur, rub or gallop.  No cervical, supra, or infraclavicular adenopathy.  Abdomen is soft without ascites, jaundice or organomegaly.  There is no lower extremity edema.  Skin shows no rash.  She does have an area of irritation over the left thigh.  This is over a previous biopsy site.  There is nothing drainable.  I do not see any fluctuance.  It is slightly red but not hot.  Skin shows  no other acute rash or evidence of bleeding.  Musculoskeletal:  No change in arthritis.  She does have more generalized bone achiness in her legs, back, etc.  No overt fracture.  Neurologic:  Mental status:  Alert and oriented.  Cranial nerves grossly intact.  Motor and sensory is unchanged.  Gait is unremarkable.  Psychiatric:  She is not overly anxious or depressed.      Laboratory tests all per flow sheet.    IMPRESSION:  Doing pretty well.    PLAN:  1.  Achiness, it is hard to tell what this may be attributed to.  She has had some degenerative joint problems and certainly it could be related to that although there was a question at one point about involvement of her non-Hodgkin's lymphoma in her spine.  The patient will have thoracic and LS spine MRI performed.  We will also get a bone scan as she has scattered bone complaints that are somewhat hard to all put together.  2.  The patient will continue IVIG.  She does know to contact us about infection.  I have again, cautioned her to make certain she gets her flu shot this fall.  3.  The patient will try topical antibiotic ointment for the redness over her thigh and she will go back to  dermatology if this gets worse.  She does not have fever, drainage or elevated white count today.  4.  Questions were otherwise addressed of hers and her husband and they will contact us if we can be of additional assistance.    EEL/dla  [id number]

## 2014-10-10 NOTE — Unmapped (Signed)
[facility] called and left message for patient -- requested that patient call our office at [phone]. Call is regarding getting records transferred to [place].

## 2014-10-11 NOTE — Unmapped (Signed)
At patient's request, [facility] faxed medical records (office notes and blood work results) from 04/30/14 to present to Dr. Caryl Comes office in [facility]. Fax #[phone] and phone #[phone]. Received confirmation that the fax went through.

## 2014-10-15 MED ORDER — atenolol (TENORMIN) 50 MG tablet
50 | ORAL_TABLET | ORAL | 0.00 refills | 30.00000 days | Status: AC
Start: 2014-10-15 — End: 2016-01-08

## 2014-10-17 ENCOUNTER — Inpatient Hospital Stay: Admit: 2014-10-17 | Payer: MEDICARE

## 2014-10-17 DIAGNOSIS — C50919 Malignant neoplasm of unspecified site of unspecified female breast: Secondary | ICD-10-CM

## 2014-10-17 DIAGNOSIS — M549 Dorsalgia, unspecified: Secondary | ICD-10-CM

## 2014-10-17 MED ORDER — GADAVIST (gadobutrol) Soln 7 mL
1 | Freq: Once | INTRAVENOUS | Status: AC | PRN
Start: 2014-10-17 — End: 2014-10-17
  Administered 2014-10-17: 13:00:00 7 mL/kg via INTRAVENOUS

## 2014-10-17 MED ORDER — Tc-99m technetium MDP (MEDRONATE) 29.5 milli Curie
Freq: Once | INTRAVENOUS | Status: AC | PRN
Start: 2014-10-17 — End: 2014-10-17
  Administered 2014-10-17: 14:00:00 29.5 via INTRAVENOUS

## 2014-10-17 NOTE — Unmapped (Signed)
-----   Message from [name], MD sent at 10/17/2014  1:22 PM EDT -----  Notify -- may want to see ortho -- Dr./ Claudie Revering  ----- Message -----     From: Damian Leavell, RN     Sent: 10/17/2014   1:21 PM       To: [name], MD        ----- Message -----     From: Rad Results In Interface     Sent: 10/17/2014  10:35 AM       To: Carmelina Noun, CNS

## 2014-10-17 NOTE — Unmapped (Signed)
Patient was notified of MRI results and notified of referral to orthopedic physicians. Ashley Madden [name]

## 2014-10-18 NOTE — Unmapped (Signed)
-----   Message from Damian Leavell, RN sent at 10/18/2014  9:45 AM EDT -----      ----- Message -----     From: [name], MD     Sent: 10/17/2014   8:24 PM       To: Jerline Pain, RN    Notify ok  ----- Message -----     From: Damian Leavell, RN     Sent: 10/17/2014   4:53 PM       To: [name], MD        ----- Message -----     From: Rad Results In Interface     Sent: 10/17/2014   4:04 PM       To: Carmelina Noun, CNS

## 2014-10-18 NOTE — Unmapped (Signed)
Patient was notified of bone scan results and provided orthopedic physicians contact information to set up appointment. Ashley Madden L Ashley Madden

## 2014-10-18 NOTE — Unmapped (Signed)
-----   Message from [name], MD sent at 10/17/2014  8:24 PM EDT -----  Notify ok  ----- Message -----     From: Damian Leavell, RN     Sent: 10/17/2014   4:53 PM       To: [name], MD        ----- Message -----     From: Rad Results In Interface     Sent: 10/17/2014   4:04 PM       To: Carmelina Noun, CNS

## 2014-10-18 NOTE — Unmapped (Signed)
Patient was called and left message regarding results.Ashley Madden L Arsh [name]

## 2014-10-21 ENCOUNTER — Inpatient Hospital Stay: Admit: 2014-10-21 | Payer: MEDICARE

## 2014-10-21 DIAGNOSIS — Z1231 Encounter for screening mammogram for malignant neoplasm of breast: Secondary | ICD-10-CM

## 2014-10-22 NOTE — Unmapped (Signed)
[facility] faxed 2016 mammogram results to Dr. Caryl Comes office at [phone]. Received confirmation that the fax went through.

## 2014-10-23 NOTE — Unmapped (Signed)
Call received from pt starting that she did not understand results of thoracic and lumbar MRI or Bone Scan and why she is being referred to Orthopaedics.    All scans reviewed with pt. explained difference in metastatic disease and the degenerative disease her scans show. Pt verbalized understanding of scan results and is agreeable to referral to Ortho

## 2014-11-05 ENCOUNTER — Ambulatory Visit: Admit: 2014-11-05 | Discharge: 2014-11-05 | Payer: MEDICARE

## 2014-11-05 ENCOUNTER — Inpatient Hospital Stay: Admit: 2014-11-05 | Payer: MEDICARE

## 2014-11-05 DIAGNOSIS — M5440 Lumbago with sciatica, unspecified side: Secondary | ICD-10-CM

## 2014-11-05 DIAGNOSIS — M25462 Effusion, left knee: Secondary | ICD-10-CM

## 2014-11-05 DIAGNOSIS — M79605 Pain in left leg: Secondary | ICD-10-CM

## 2014-11-05 NOTE — Unmapped (Signed)
Chief Complaint: back pain and leg pain    Subjective:   Ashley Madden is a 81 y.o.  female with a  history of back pain for 12  years. Injury: unknown Pain is described as aching and is intermittent. The pain radiates to the left thigh and knee . Back pain is less bothersome than leg pain. The patient's pain currently is rated a 2 and at it's worse a 8. Symptoms are relieved by water aerobics help back pain but not leg pain and provoked by standing and walking. Patient denies urinary and bowel retention and incontinence. Patient denies numbness & tingling. Treatment to date: water aerobics.      Past Med/Surg/Family History  Past Medical History   Diagnosis Date   ??? 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 Breast cancer      Past Surgical History   Procedure Laterality Date   ??? Breast biopsy       Family History   Problem Relation Age of Onset   ??? Breast cancer Sister 34       Past Social History  Social History   Substance Use Topics   ??? Smoking status: Never Smoker    ??? Smokeless tobacco: None   ??? Alcohol Use: Yes      Comment: socially     No Known Allergies    Medications:    (Not in a hospital admission)  Current Outpatient Prescriptions   Medication Sig Dispense Refill   ??? atenolol (TENORMIN) 50 MG tablet TAKE 1 TABLET BY MOUTH DAILY 90 tablet 3   ??? atorvastatin (LIPITOR) 20 MG tablet TK 1 T PO QD  0   ??? CALCIUM PHOSPHATE TRIB/VIT D3 (CITRACAL + D ORAL) Take 1 tablet by mouth 2 times a day. (600 mg calcium-500 unit)  Take in the am and pm     ??? dicyclomine (BENTYL) 10 MG capsule Take 1 capsule (10 mg total) by mouth as needed. Take one capsule by mouth every 6 hours as needed for abdominal cramping 30 capsule 3   ??? fluticasone-salmeterol (ADVAIR) 250-50 mcg/dose diskus inhaler Inhale 1 puff into the lungs 2 times a day. 1 Inhaler 5   ??? levothyroxine (SYNTHROID, LEVOTHROID) 125 MCG tablet Take 125 mcg by mouth daily.     ??? meloxicam (MOBIC) 7.5 MG tablet Take 1 tablet (7.5 mg total) by mouth daily. 60  tablet 0   ??? psyllium (METAMUCIL) powder Take 1 packet by mouth daily. At bedtime per Gastroenterologist       No current facility-administered medications for this visit.         ROS:  All others negative except for as stated in HPI.    OSWESTRY:  /50    VAS    /10    PHYSICAL EXAM:  Filed Vitals:    11/05/14 1410   BP: 120/62   Pulse: 58   Resp: 18   Height: 5' 4 (1.626 m)   Weight: 145 lb (65.772 kg)       General:  Well nourished, normally developed female who is A&Ox3 and in NAD, appropriate mood and affect  CV:  RRR, feet are warm, well perfused with brisk capillary refill bilaterally  RESP:  breathing regular and unlabored    SPINE:    Gait: antalgic gait, with distinct L knee pain ,   REDUCED ROM OF THE L KNEE   PAINFUL MEDIAL JOINT LINE   PRIOR HEMIARTHROPLASTY OF THE JOINT WITH VARUS DEFORMITY  Inspection and palpation:  inspection of back is normal, EXCEPT FOR MILD SCOLIOSIS CURVE NOTICED WITH FORWARD BENDING    Muscle tone and ROM exam: limited range of motion without pain  Straight leg raise: negative at 70 degrees bilaterally  Neurological: normal DTRs, muscle strength and reflexes  Sensation is normal    MOTOR:    LE   LEFT   RIGHT  Hip flexion  5/5   5/5  Hip adduction  5/5   5/5  Knee extension 4/5   4/5  Knee flexion  4/5   4/5  Dorsiflexion  4/5   4/5  Plantar flexion  4/5   4/5  EHL   4/5   4/5    DEEP TENDON REFLEXES: L  R      KJ        1 /4  1 /4      AJ          1 /4  1 /4    IMAGING:  Lumbar spine xrays were reviewed and show   DEGEN SCOLIOSIS  Thoracogenic scoliosis of thoracolumbar region    L CONCAVE 36 DEGREES, DEGENERATIVE  , COMPENSATORY R THORACIC CONCAVE    L KNEE XRAYS   HISTORY OF UNICOMPARTEMENTAL TKR , NOW WITH LOOSENING ON XRAY FEMORAL COMPONENT    MRI    CT SCAN    OTHER TESTS  EMG    Assessment:  Ashley Madden is a 81 y.o. female with back pain, lumbar   AND L KNEE AND LEG PAIN    antalgic gait, with distinct L knee pain ,   REDUCED ROM OF THE L KNEE   PAINFUL MEDIAL JOINT  LINE   PRIOR HEMIARTHROPLASTY OF THE JOINT WITH VARUS DEFORMITY  Inspection and palpation: inspection of back is normal, EXCEPT FOR MILD SCOLIOSIS CURVE NOTICED WITH FORWARD BENDING    Muscle tone and ROM exam: limited range of motion without pain  Straight leg raise: negative at 70 degrees bilaterally      IMAGING:  Lumbar spine xrays were reviewed and show   DEGEN SCOLIOSIS  Thoracogenic scoliosis of thoracolumbar region    L CONCAVE 36 DEGREES, DEGENERATIVE  , COMPENSATORY R THORACIC CONCAVE    L KNEE XRAYS   HISTORY OF UNICOMPARTEMENTAL TKR , NOW WITH LOOSENING ON XRAY FEMORAL COMPONENT    HISTORY OF UNICOMPARTEMENTAL TKR , NOW WITH LOOSENING ON XRAY FEMORAL COMPONENT    Plan:  1.  REFERRAL FOR EVALUATION OF FAILED L UNICOMPARTMENTAL TKR , DR KELLEY  2.       Answered all of the patient???s questions to her satisfaction and understanding, and she is in agreement with the plan.  I have discussed this treatment plan in detail with the patient and their family members in attendance, including non-surgical and surgical treatment options with them in detail.I had an extensive discussion with the patient regarding various surgical treatments.  Discussed open and minimally invasive decompression techniques, and indications for fusion.  Discussed anterior or posterior approaches.  We had a discussion regarding hospitalization as well as postoperative rehabilitation and recovery, restrictions.  Given patient information booklet, reviewed diagrams in detail, and referred to website for clinical/ technical videos.We discussed risks of surgery including but not limited to bleeding, pain, infection, scarring, dysphonia, dysphagia, esophageal injury with anterior cervical approaches and need for diverted feedings, nerve injury/ paralysis, CSF leak, failure of wound or bony healing, persistent symptoms and need for further surgery, as well as risks of anesthesia.  I have discussed surgical treatment in great detail,  including operative risks, neurologic and non-neurologic, possible outcomes including the fact that there may be other operative and non-operative treatments required in the future, and that this treatment would be very unlikely to alleviate all back and leg pain difficulties but hopefully provide improvement.  They state understanding and wish to proceed.    Thank You    Bonnita Nasuti, M.D.  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

## 2014-11-08 ENCOUNTER — Ambulatory Visit: Admit: 2014-11-08 | Discharge: 2014-11-08 | Payer: MEDICARE

## 2014-11-08 DIAGNOSIS — M25562 Pain in left knee: Secondary | ICD-10-CM

## 2014-11-08 NOTE — Unmapped (Signed)
This office note has been dictated.

## 2014-11-08 NOTE — Unmapped (Signed)
[facility]     PATIENT NAME: Ashley Madden, Ashley Madden               MRN: [id number]  DATE OF BIRTH: 07-Nov-1930                      CSN: [id number]  PROVIDER: Joycelyn Das, M.D.                    VISIT DATE: 11/08/2014                                NEW PATIENT OFFICE VISIT     CHIEF COMPLAINT:  Left thigh and left knee pain.     HISTORY:  Patient is here for evaluation of left thigh and left knee pain.  She had previous left medial compartment arthroplasty in 2004 by Dr. Cherlynn Polo.  She did okay with that.  She did have some occasional discomfort, but this  seemed to be reasonably beneficial for her.  She did have some recent  excision of a thigh mass removed recently and since then she has had some  discomfort in this area of her thigh, anterior and somewhat medial thigh.  This surgery which was done by Dr. Janee Morn,  who she believes is with [facility].  Since then, if she is standing for long periods of time she  has a burning pain in the anterior medial aspect of the thigh.  She has  continued to be followed by Dr. Janee Morn.  They suggested some tighter yoga pants  for some compression.  This seems to be the main source of her pain.     EXAM:  She ambulates independently in clinic today without the use of an  assistive device.  She has crepitus with knee range of motion of left knee  with range of motion 0 to 110 degrees.  There is crepitus along the incision  site.  Her incision is well-healed.  There is no erythema.  Her knee is  stable.  She has good strength.  She has mildly decreased range of motion of  the left hip with mild pain with hip internal rotation on the left side.  She  has good strength.     IMAGING:  Views of the left knee dated November 05, 2014 demonstrate medial  compartment arthroplasty with no radiolucencies.  There may be some mild  stress shielding on the femoral side.  There is degenerative changes  of the  lateral and anterior compartment with chondrocalcinosis present.  Views of  her pelvis dated October 18 demonstrate bilateral hip arthritis with joint  height narrowing, sclerosis, osteophyte formation.  She has degenerative  changes of her lower lumbar spine.     Bone scan dated October 17, 2014 demonstrates no significant uptake around  the left knee.     ASSESSMENT:  1. Left thigh pain, status post excision of mass (Dr. Janee Morn) from [facility].  2. Left knee pain, status post previous left medial compartment arthroplasty     (Dr. Cherlynn Polo, 2004).     PLAN:  I had a lengthy discussion with her.  I discussed the possibility of  progression of degenerative changes in the left knee maybe leading to some  of  the discomfort in the knee.  That does not seem to be her main concern or  complaint at this point.  The main issue is standing for periods of time with  pain in the left anterior medial thigh which is near the previous excision of  the mass that she had.  Hopefully with some additional time this will settle  down for her.  She will continue to follow up with Dr. Janee Morn on this.  I  discussed that some of the pain may be related to the hip arthritis as well.  She was recently placed on meloxicam and she can give that a try.  I would  like to see her back in about 6 months for repeat x-rays of the knee.  We can  certainly repeat the bone scan down the road if there is any concern.  They  are traveling to [place], [place] area for the winter.  I gave them the name  of a colleague down there if they run into any trouble and need to see  someone in that area over the winter.  They understand and agree with the  plan of care.                                              Joycelyn Das, M.D.  TK/nwc  D:  11/08/2014 08:30  T:  11/09/2014 16:28  Job #:  [id number]           NEW PATIENT OFFICE VISIT                                     PAGE    1 of   1

## 2014-12-11 NOTE — Unmapped (Signed)
Not currently taking incoming calls

## 2014-12-11 NOTE — Unmapped (Signed)
Patient states that she doesn't recall which shoe she was supposed to put the lift in and wants to make sure she is doing what she was advised to do.      LOV: 11/05/2014    Plan:  1.?? REFERRAL FOR EVALUATION OF FAILED L UNICOMPARTMENTAL TKR , DR KELLEY  2.  ????????????????????????    Answered all of the patient???s questions to her satisfaction and understanding, and she is in agreement with the plan.?? I have discussed this treatment plan in detail with the patient and their family members in attendance, including non-surgical and surgical treatment options with them in detail.I had an extensive discussion with the patient regarding various surgical treatments.?? Discussed open and minimally invasive decompression techniques, and indications for fusion.?? Discussed anterior or posterior approaches.?? We had a discussion regarding hospitalization as well as postoperative rehabilitation and recovery, restrictions.?? Given patient information booklet, reviewed diagrams in detail, and referred to website for clinical/ technical videos.We discussed risks of surgery including but not limited to bleeding, pain, infection, scarring, dysphonia, dysphagia, esophageal injury with anterior cervical approaches and need for diverted feedings, nerve injury/ paralysis, CSF leak, failure of wound or bony healing, persistent symptoms and need for further surgery, as well as risks of anesthesia.   ?????? I have discussed surgical treatment in great detail, including operative risks, neurologic and non-neurologic, possible outcomes including the fact that there may be other operative and non-operative treatments required in the future, and that this treatment would be very unlikely to alleviate all back and leg pain difficulties but hopefully provide improvement.?? They state understanding and wish to proceed.      Please advise

## 2014-12-11 NOTE — Unmapped (Signed)
XRAYS REVIEWED IT SHOULD BE IN RIGHT

## 2014-12-16 NOTE — Unmapped (Signed)
Home number still not taking incoming calls.    I left message on cell phone number.

## 2015-03-25 ENCOUNTER — Ambulatory Visit: Admit: 2015-03-25 | Payer: MEDICARE | Attending: Hematology & Oncology

## 2015-03-25 ENCOUNTER — Ambulatory Visit: Admit: 2015-03-25 | Payer: MEDICARE

## 2015-03-25 ENCOUNTER — Other Ambulatory Visit: Admit: 2015-03-25 | Payer: MEDICARE

## 2015-03-25 DIAGNOSIS — J8410 Pulmonary fibrosis, unspecified: Secondary | ICD-10-CM

## 2015-03-25 DIAGNOSIS — D803 Selective deficiency of immunoglobulin G [IgG] subclasses: Secondary | ICD-10-CM

## 2015-03-25 DIAGNOSIS — C50912 Malignant neoplasm of unspecified site of left female breast: Secondary | ICD-10-CM

## 2015-03-25 LAB — COMPREHENSIVE METABOLIC PANEL
ALT: 12 U/L (ref 7–52)
AST: 15 U/L (ref 13–39)
Albumin: 4 g/dL (ref 3.5–5.7)
Alkaline Phosphatase: 95 U/L (ref 36–125)
Anion Gap: 6 mmol/L (ref 3–16)
BUN: 20 mg/dL (ref 7–25)
CO2: 27 mmol/L (ref 21–33)
Calcium: 9.4 mg/dL (ref 8.6–10.3)
Chloride: 107 mmol/L (ref 98–110)
Creatinine: 0.56 mg/dL (ref 0.60–1.30)
Glucose: 88 mg/dL (ref 70–100)
Osmolality, Calculated: 292 mOsm/kg (ref 278–305)
Potassium: 3.6 mmol/L (ref 3.5–5.3)
Sodium: 140 mmol/L (ref 133–146)
Total Bilirubin: 0.6 mg/dL (ref 0.0–1.5)
Total Protein: 6.3 g/dL (ref 6.4–8.9)
eGFR AA CKD-EPI: >90 See note.
eGFR NONAA CKD-EPI: 87 See note.

## 2015-03-25 LAB — CBC
Hematocrit: 34.8 % (ref 35.0–45.0)
Hemoglobin: 11.5 g/dL (ref 11.7–15.5)
MCH: 28.3 pg (ref 27.0–33.0)
MCHC: 33.1 g/dL (ref 32.0–36.0)
MCV: 85.4 fL (ref 80.0–100.0)
MPV: 9.7 fL (ref 7.5–11.5)
Platelets: 165 10*3/uL (ref 140–400)
RBC: 4.07 10*6/uL (ref 3.80–5.10)
RDW: 15.7 % (ref 11.0–15.0)
WBC: 4 10*3/uL (ref 3.8–10.8)

## 2015-03-25 LAB — DIFFERENTIAL
Basophils Absolute: 12 /uL (ref 0–200)
Basophils Relative: 0.3 % (ref 0.0–1.0)
Eosinophils Absolute: 72 /uL (ref 15–500)
Eosinophils Relative: 1.8 % (ref 0.0–8.0)
Lymphocytes Absolute: 692 /uL (ref 850–3900)
Lymphocytes Relative: 17.3 % (ref 15.0–45.0)
Monocytes Absolute: 592 /uL (ref 200–950)
Monocytes Relative: 14.8 % (ref 0.0–12.0)
Neutrophils Absolute: 2632 /uL (ref 1500–7800)
Neutrophils Relative: 65.8 % (ref 40.0–80.0)
nRBC: 0 /100 WBC (ref 0–0)

## 2015-03-25 MED ORDER — EPINEPHrine (ADRENALIN) 1 mg/mL injection 0.3 mg
1 | Freq: Every day | INTRAMUSCULAR | Status: AC | PRN
Start: 2015-03-25 — End: 2015-03-25

## 2015-03-25 MED ORDER — hydrocortisone sod succ (PF)(SOLU-CORTEF) 100 mg injection
100 | Freq: Every day | INTRAMUSCULAR | Status: AC | PRN
Start: 2015-03-25 — End: 2015-03-25

## 2015-03-25 MED ORDER — sodium chloride 0.9 % infusion
Freq: Every day | INTRAVENOUS | Status: AC | PRN
Start: 2015-03-25 — End: 2015-03-25

## 2015-03-25 MED ORDER — immune globulin (human) (IgG) (GAMMAGARD) Soln 25 g
10 | Freq: Once | INTRAMUSCULAR | Status: AC
Start: 2015-03-25 — End: 2015-03-25

## 2015-03-25 MED ORDER — albuterol (PROVENTIL;VENTOLIN;PROAIR) inhaler 1-2 puff
90 | RESPIRATORY_TRACT | Status: AC | PRN
Start: 2015-03-25 — End: 2015-03-25

## 2015-03-25 MED ORDER — diphenhydrAMINE (BENADRYL) capsule 25 mg
25 | Freq: Once | ORAL | Status: AC
Start: 2015-03-25 — End: 2015-03-25

## 2015-03-25 MED ORDER — diphenhydrAMINE (BENADRYL) injection 25 mg
50 | Freq: Every day | INTRAMUSCULAR | Status: AC | PRN
Start: 2015-03-25 — End: 2015-03-25

## 2015-03-25 MED ORDER — sodium chloride 0.9 % flush 10 mL
Freq: Every day | INTRAMUSCULAR | Status: AC | PRN
Start: 2015-03-25 — End: 2015-03-25

## 2015-03-25 MED ORDER — cephALEXin (KEFLEX) 250 MG capsule
250 | ORAL_CAPSULE | Freq: Four times a day (QID) | ORAL | Status: AC
Start: 2015-03-25 — End: 2015-04-04

## 2015-03-25 MED ORDER — heparin lock flush 100 unit/mL Syrg 500 Units
100 | Freq: Every day | INTRAVENOUS | Status: AC | PRN
Start: 2015-03-25 — End: 2015-03-25

## 2015-03-25 MED ORDER — acetaminophen (TYLENOL) tablet 650 mg
325 | Freq: Once | ORAL | Status: AC
Start: 2015-03-25 — End: 2015-03-25

## 2015-03-25 MED FILL — GAMMAGARD LIQUID 10 % INJECTION SOLUTION: 10 10 % | INTRAMUSCULAR | Qty: 250

## 2015-03-25 NOTE — Unmapped (Signed)
Pt will come back in am for tx. Ok per Brain Hilts, CNP.

## 2015-03-25 NOTE — Unmapped (Signed)
See dictated note

## 2015-03-26 ENCOUNTER — Ambulatory Visit: Admit: 2015-03-26 | Discharge: 2015-03-26 | Payer: MEDICARE

## 2015-03-26 DIAGNOSIS — D803 Selective deficiency of immunoglobulin G [IgG] subclasses: Secondary | ICD-10-CM

## 2015-03-26 MED ORDER — albuterol (PROVENTIL;VENTOLIN;PROAIR) inhaler 1-2 puff
90 | RESPIRATORY_TRACT | Status: AC | PRN
Start: 2015-03-26 — End: 2015-03-26

## 2015-03-26 MED ORDER — sodium chloride 0.9 % infusion
Freq: Every day | INTRAVENOUS | Status: AC | PRN
Start: 2015-03-26 — End: 2015-03-26

## 2015-03-26 MED ORDER — acetaminophen (TYLENOL) tablet 650 mg
325 | Freq: Once | ORAL | Status: AC
Start: 2015-03-26 — End: 2015-03-26
  Administered 2015-03-26: 14:00:00 650 mg via ORAL

## 2015-03-26 MED ORDER — heparin lock flush 100 unit/mL Syrg 500 Units
100 | Freq: Every day | INTRAVENOUS | Status: AC | PRN
Start: 2015-03-26 — End: 2015-03-26

## 2015-03-26 MED ORDER — immune globulin (human) (IgG) (GAMMAGARD) Soln 25 g
10 | Freq: Once | INTRAMUSCULAR | Status: AC
Start: 2015-03-26 — End: 2015-03-26
  Administered 2015-03-26: 15:00:00 25 g/kg via INTRAVENOUS

## 2015-03-26 MED ORDER — hydrocortisone sod succ (PF)(SOLU-CORTEF) 100 mg injection
100 | Freq: Every day | INTRAMUSCULAR | Status: AC | PRN
Start: 2015-03-26 — End: 2015-03-26

## 2015-03-26 MED ORDER — sodium chloride 0.9 % flush 10 mL
Freq: Every day | INTRAMUSCULAR | Status: AC | PRN
Start: 2015-03-26 — End: 2015-03-26

## 2015-03-26 MED ORDER — EPINEPHrine (ADRENALIN) 1 mg/mL injection 0.3 mg
1 | Freq: Every day | INTRAMUSCULAR | Status: AC | PRN
Start: 2015-03-26 — End: 2015-03-26

## 2015-03-26 MED ORDER — diphenhydrAMINE (BENADRYL) capsule 25 mg
25 | Freq: Once | ORAL | Status: AC
Start: 2015-03-26 — End: 2015-03-26
  Administered 2015-03-26: 14:00:00 25 mg via ORAL

## 2015-03-26 MED ORDER — diphenhydrAMINE (BENADRYL) injection 25 mg
50 | Freq: Every day | INTRAMUSCULAR | Status: AC | PRN
Start: 2015-03-26 — End: 2015-03-26

## 2015-03-26 MED FILL — GAMMAGARD LIQUID 10 % INJECTION SOLUTION: 10 10 % | INTRAMUSCULAR | Qty: 250

## 2015-03-26 MED FILL — TYLENOL 325 MG TABLET: 325 325 mg | ORAL | Qty: 2

## 2015-03-26 MED FILL — DIPHENHYDRAMINE 25 MG CAPSULE: 25 25 mg | ORAL | Qty: 1

## 2015-03-26 NOTE — Unmapped (Signed)
DICTATED ADDENDUM.  PROVIDER TO REVIEW     DIAGNOSIS(ES):    1. Left-sided breast cancer, T1, N0, M0, triple-negative.    2. History of non-Hodgkin's lymphoma.    3. 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      Ashley Madden is here for follow-up.  Overall she is doing pretty well.  She recently had a left total hip replaced.  She did pretty well with it.  She has a little bit of redness and tenderness there for which we will give her Keflex and she will see Dr. Lyda Perone, her surgeon.  She is doing physical therapy, but just feels very tired.  She otherwise has avoided any respiratory infections.  Because of her low IgG levels and surgery, I did tell her that I think she should go on the antibiotic and we will continue her infusion.  She is slightly late to receive it because of her surgery.  She has had no other GI, GU, pulmonary, cardiac, neurologic, musculoskeletal, cutaneous, endocrine, psychiatric, hematologic or other major difficulties.  Remainder of a 10-point review of systems is unchanged.    PHYSICAL EXAMINATION:  Vital signs per flow sheet.  She is alert, oriented and otherwise in no acute distress.  Throat is clear without ulceration or Candida.  Lungs are clear.  Cardiac exam- No murmur, rub or gallop.  No cervical, supra, infraclavicular adenopathy.  Abdomen is soft without ascites, jaundice or organomegaly.  There is trace lower extremity edema, which is symmetric.  Skin shows no rash.  She does have a little bit of redness in the superior aspect of her left thigh incision.  There is no drainage.  There is a small open area which looks like it is going to heal.  Remainder of her skin exam is negative.  Musculoskeletal- No change in arthritis.  No overt fracture.  Neurologic- Mental status, alert and oriented.  Cranial nerves grossly intact.  Motor and sensory unchanged.  Gait is unremarkable.  She is not using an assist device.  Psychiatric- She is not overly anxious or depressed.      Laboratory tests  all per flow sheet.    IMPRESSION:  Doing well.    PLAN:    1. Patient will continue IVIG.    2. She will go on Keflex for a mild skin infection.  She will contact the surgeon, Dr. Lyda Perone.    3. Copies of blood work given and reviewed.    4. She is aware that she will need other evaluations including mammogram and DEXA in the fall.    5. Questions addressed.    EEL/nwc   [id number]

## 2015-03-26 NOTE — Unmapped (Signed)
Pt arrived ambulatory via cane to infusion room for scheduled tx. Yellow armband placed on pt. Safety brochure given to pt, call light at pts side with instructions to call nurse if needs up from treatment chair, chair in locked position. Pt voices understanding. RN placed piv; BR/flush noted. Administered pre meds and IVIG as ordered. Pt tolerated well and denied other needs; declined AVS. PIV removed and gauze/band-aid applied. Pt discharged ambulatory via cane with husband.

## 2015-05-02 NOTE — Unmapped (Signed)
[facility] received a phone call/fax from [facility] requesting medical records for this patient. CSC faxed requested info to them at [phone]. Received confirmation that the fax went through.

## 2015-07-28 ENCOUNTER — Other Ambulatory Visit: Admit: 2015-07-28 | Payer: MEDICARE

## 2015-07-28 ENCOUNTER — Ambulatory Visit: Admit: 2015-07-28 | Payer: MEDICARE | Attending: Hematology & Oncology

## 2015-07-28 ENCOUNTER — Ambulatory Visit: Admit: 2015-07-28 | Discharge: 2015-07-28 | Payer: MEDICARE

## 2015-07-28 DIAGNOSIS — C50919 Malignant neoplasm of unspecified site of unspecified female breast: Secondary | ICD-10-CM

## 2015-07-28 DIAGNOSIS — D803 Selective deficiency of immunoglobulin G [IgG] subclasses: Secondary | ICD-10-CM

## 2015-07-28 LAB — DIFFERENTIAL
Basophils Absolute: 9 /uL (ref 0–200)
Basophils Relative: 0.3 % (ref 0.0–1.0)
Eosinophils Absolute: 41 /uL (ref 15–500)
Eosinophils Relative: 1.4 % (ref 0.0–8.0)
Lymphocytes Absolute: 476 /uL (ref 850–3900)
Lymphocytes Relative: 16.4 % (ref 15.0–45.0)
Monocytes Absolute: 441 /uL (ref 200–950)
Monocytes Relative: 15.2 % (ref 0.0–12.0)
Neutrophils Absolute: 1934 /uL (ref 1500–7800)
Neutrophils Relative: 66.7 % (ref 40.0–80.0)
nRBC: 0 /100 WBC (ref 0–0)

## 2015-07-28 LAB — COMPREHENSIVE METABOLIC PANEL
ALT: 18 U/L (ref 7–52)
AST: 21 U/L (ref 13–39)
Albumin: 4 g/dL (ref 3.5–5.7)
Alkaline Phosphatase: 73 U/L (ref 36–125)
Anion Gap: 8 mmol/L (ref 3–16)
BUN: 19 mg/dL (ref 7–25)
CO2: 29 mmol/L (ref 21–33)
Calcium: 9 mg/dL (ref 8.6–10.3)
Chloride: 103 mmol/L (ref 98–110)
Creatinine: 0.69 mg/dL (ref 0.60–1.30)
Glucose: 82 mg/dL (ref 70–100)
Osmolality, Calculated: 291 mOsm/kg (ref 278–305)
Potassium: 4.2 mmol/L (ref 3.5–5.3)
Sodium: 140 mmol/L (ref 133–146)
Total Bilirubin: 0.7 mg/dL (ref 0.0–1.5)
Total Protein: 6.1 g/dL (ref 6.4–8.9)
eGFR AA CKD-EPI: >90 See note.
eGFR NONAA CKD-EPI: 81 See note.

## 2015-07-28 LAB — CBC
Hematocrit: 39.3 % (ref 35.0–45.0)
Hemoglobin: 13.4 g/dL (ref 11.7–15.5)
MCH: 28.9 pg (ref 27.0–33.0)
MCHC: 34.1 g/dL (ref 32.0–36.0)
MCV: 84.8 fL (ref 80.0–100.0)
MPV: 10 fL (ref 7.5–11.5)
Platelets: 120 10*3/uL (ref 140–400)
RBC: 4.64 10*6/uL (ref 3.80–5.10)
RDW: 15.9 % (ref 11.0–15.0)
WBC: 2.9 10*3/uL (ref 3.8–10.8)

## 2015-07-28 MED ORDER — diphenhydrAMINE (BENADRYL) injection 25 mg
50 | Freq: Every day | INTRAMUSCULAR | Status: AC | PRN
Start: 2015-07-28 — End: 2015-07-28

## 2015-07-28 MED ORDER — heparin lock flush 100 unit/mL Syrg 500 Units
100 | Freq: Every day | INTRAVENOUS | Status: AC | PRN
Start: 2015-07-28 — End: 2015-07-28

## 2015-07-28 MED ORDER — EPINEPHrine (ADRENALIN) 1 mg/mL injection 0.3 mg
1 | Freq: Every day | INTRAMUSCULAR | Status: AC | PRN
Start: 2015-07-28 — End: 2015-07-28

## 2015-07-28 MED ORDER — acetaminophen (TYLENOL) tablet 650 mg
325 | Freq: Once | ORAL | Status: AC
Start: 2015-07-28 — End: 2015-07-28
  Administered 2015-07-28: 17:00:00 650 mg via ORAL

## 2015-07-28 MED ORDER — diphenhydrAMINE (BENADRYL) capsule 25 mg
25 | Freq: Once | ORAL | Status: AC
Start: 2015-07-28 — End: 2015-07-28
  Administered 2015-07-28: 17:00:00 25 mg via ORAL

## 2015-07-28 MED ORDER — immune globulin (human) (IgG) (GAMMAGARD) Soln 25 g
10 | Freq: Once | INTRAMUSCULAR | Status: AC
Start: 2015-07-28 — End: 2015-07-28
  Administered 2015-07-28: 18:00:00 25 g/kg via INTRAVENOUS

## 2015-07-28 MED ORDER — albuterol (PROVENTIL;VENTOLIN;PROAIR) inhaler 1-2 puff
90 | RESPIRATORY_TRACT | Status: AC | PRN
Start: 2015-07-28 — End: 2015-07-28

## 2015-07-28 MED ORDER — sodium chloride 0.9 % flush 10 mL
Freq: Every day | INTRAMUSCULAR | Status: AC | PRN
Start: 2015-07-28 — End: 2015-07-28

## 2015-07-28 MED ORDER — sodium chloride 0.9 % infusion
Freq: Every day | INTRAVENOUS | Status: AC | PRN
Start: 2015-07-28 — End: 2015-07-28
  Administered 2015-07-28: 18:00:00 25 mL/h via INTRAVENOUS

## 2015-07-28 MED ORDER — hydrocortisone sod succ (PF)(SOLU-CORTEF) 100 mg injection
100 | Freq: Every day | INTRAMUSCULAR | Status: AC | PRN
Start: 2015-07-28 — End: 2015-07-28

## 2015-07-28 MED FILL — TYLENOL 325 MG TABLET: 325 325 mg | ORAL | Qty: 2

## 2015-07-28 MED FILL — DIPHENHYDRAMINE 25 MG CAPSULE: 25 25 mg | ORAL | Qty: 1

## 2015-07-28 MED FILL — GAMMAGARD LIQUID 10 % INJECTION SOLUTION: 10 10 % | INTRAMUSCULAR | Qty: 250

## 2015-07-28 NOTE — Unmapped (Signed)
See dictated note

## 2015-07-28 NOTE — Unmapped (Signed)
Pt here for IV access and labs before appt with Dr. Samara Snide. After MD appt, pt here for IVIG. See MD visit note for Vitals, Allergy and Medication review.Pt not a fall risk on assessment.Labs verified to be in normal range for ordered treatment. Pt tolerated treatment without difficulty. Discharged ambulatory, given AVS.

## 2015-07-29 NOTE — Unmapped (Signed)
[facility] called [facility] to follow up on pt's referral, they will message the pt and said that pt should call the office to schedule; I called the pt and relayed this message on her voice mail.

## 2015-07-29 NOTE — Unmapped (Signed)
DIAGNOSIS(ES):    1.  Breast cancer.    2.  Lung cancer.    3.  IgG deficiency.      Ashley Madden is here for continuation of IVIG.  Overall she is doing pretty well from an infection aspect as long as she stays on her every six weeks IVIG.  She has had no major big changes.  She had her hip replaced a few months ago.  She is still not back to normal.  She is a bit discouraged about it.  She is not walking that much on the land but she is doing aerobics on a regular basis which she very much enjoys.  I have told her she should probably slowly start walking more to see how she does.  She is not using a cane.  She has had no chest discomfort.  She is due for DEXA scan and mammogram the end of the year.  This will be ordered for her.  No other new GI, GU, pulmonary, cardiac, neurologic, musculoskeletal, cutaneous, endocrine, psychiatric, hematologic, neurologic or other difficulties.  She and her family will be going to [place] in November.  Remainder of a ten-point review of systems are unchanged.    PHYSICAL EXAMINATION:  Vital signs as noted.  She is alert and oriented and otherwise in no acute distress.  Throat is clear without ulceration or Candida.  Lungs are clear.  Cardiac exam - No murmur, rub or gallop.  No cervical, supra, infraclavicular adenopathy.  Abdomen is soft, without ascites, jaundice, or organomegaly.  There is no Estuardo Frisbee extremity edema.  She is not clubbed.  Skin is without rash or ecchymoses.  Musculoskeletal - No active arthritis.  No overt fracture.  Neurologic - Mental status alert and oriented.  Cranial nerves unchanged.  Motor and sensory unchanged.  Gait is unremarkable.  Psychiatric - She is not overly anxious or depressed.      Laboratory tests all per flow sheet.      IMPRESSION:  Clinically doing well.    PLAN:    1. The patient will continue with current IVIG.    2. Copies of blood work given and reviewed.  3. She will contact us for additional difficulties and return as noted.  It should be  noted that she will be set up for mammogram and DEXA scan in October of this year as well.      EEL/jlq   [id number]

## 2015-09-08 ENCOUNTER — Other Ambulatory Visit: Admit: 2015-09-08 | Payer: MEDICARE

## 2015-09-08 ENCOUNTER — Ambulatory Visit: Admit: 2015-09-08 | Payer: MEDICARE

## 2015-09-08 ENCOUNTER — Ambulatory Visit: Admit: 2015-09-08 | Payer: MEDICARE | Attending: Hematology & Oncology

## 2015-09-08 DIAGNOSIS — D803 Selective deficiency of immunoglobulin G [IgG] subclasses: Secondary | ICD-10-CM

## 2015-09-08 DIAGNOSIS — C50912 Malignant neoplasm of unspecified site of left female breast: Secondary | ICD-10-CM

## 2015-09-08 DIAGNOSIS — C50919 Malignant neoplasm of unspecified site of unspecified female breast: Secondary | ICD-10-CM

## 2015-09-08 LAB — COMPREHENSIVE METABOLIC PANEL
ALT: 14 U/L (ref 7–52)
AST: 19 U/L (ref 13–39)
Albumin: 4.3 g/dL (ref 3.5–5.7)
Alkaline Phosphatase: 86 U/L (ref 36–125)
Anion Gap: 7 mmol/L (ref 3–16)
BUN: 24 mg/dL (ref 7–25)
CO2: 29 mmol/L (ref 21–33)
Calcium: 9.2 mg/dL (ref 8.6–10.3)
Chloride: 106 mmol/L (ref 98–110)
Creatinine: 0.59 mg/dL (ref 0.60–1.30)
Glucose: 95 mg/dL (ref 70–100)
Osmolality, Calculated: 298 mOsm/kg (ref 278–305)
Potassium: 3.9 mmol/L (ref 3.5–5.3)
Sodium: 142 mmol/L (ref 133–146)
Total Bilirubin: 0.6 mg/dL (ref 0.0–1.5)
Total Protein: 6.4 g/dL (ref 6.4–8.9)
eGFR AA CKD-EPI: >90 See note.
eGFR NONAA CKD-EPI: 85 See note.

## 2015-09-08 LAB — CBC
Hematocrit: 39.7 % (ref 35.0–45.0)
Hemoglobin: 13.4 g/dL (ref 11.7–15.5)
MCH: 29.3 pg (ref 27.0–33.0)
MCHC: 33.7 g/dL (ref 32.0–36.0)
MCV: 86.9 fL (ref 80.0–100.0)
MPV: 10.4 fL (ref 7.5–11.5)
Platelets: 110 10*3/uL (ref 140–400)
RBC: 4.57 10*6/uL (ref 3.80–5.10)
RDW: 15.2 % (ref 11.0–15.0)
WBC: 3.6 10*3/uL (ref 3.8–10.8)

## 2015-09-08 LAB — IGA: IgA: 68.7 mg/dL (ref 82.0–453.0)

## 2015-09-08 LAB — KAPPA/LAMBDA, FREE LIGHT CHAINS, SERUM
Kappa/Lambda Ratio: 0.93 ratio (ref 0.26–1.65)
Kappa: 9.2 mg/L (ref 3.3–19.4)
Lambda: 9.9 mg/L (ref 5.7–26.3)

## 2015-09-08 LAB — FASTING STATUS

## 2015-09-08 LAB — IGG: IgG: 676 mg/dL (ref 751.0–1560.0)

## 2015-09-08 LAB — IGM: IgM: 75.5 mg/dL (ref 46.0–304.0)

## 2015-09-08 MED ORDER — acetaminophen (TYLENOL) tablet 650 mg
325 | Freq: Once | ORAL | Status: AC
Start: 2015-09-08 — End: 2015-09-08
  Administered 2015-09-08: 17:00:00 650 mg via ORAL

## 2015-09-08 MED ORDER — EPINEPHrine (ADRENALIN) 1 mg/mL injection 0.3 mg
1 | Freq: Every day | INTRAMUSCULAR | Status: AC | PRN
Start: 2015-09-08 — End: 2015-09-08

## 2015-09-08 MED ORDER — diphenhydrAMINE (BENADRYL) capsule 25 mg
25 | Freq: Once | ORAL | Status: AC
Start: 2015-09-08 — End: 2015-09-08
  Administered 2015-09-08: 17:00:00 25 mg via ORAL

## 2015-09-08 MED ORDER — sodium chloride 0.9 % flush 10 mL
Freq: Every day | INTRAMUSCULAR | Status: AC | PRN
Start: 2015-09-08 — End: 2015-09-08

## 2015-09-08 MED ORDER — sodium chloride 0.9 % infusion
Freq: Every day | INTRAVENOUS | Status: AC | PRN
Start: 2015-09-08 — End: 2015-09-08
  Administered 2015-09-08: 18:00:00 25 mL/h via INTRAVENOUS

## 2015-09-08 MED ORDER — diphenhydrAMINE (BENADRYL) injection 25 mg
50 | Freq: Every day | INTRAMUSCULAR | Status: AC | PRN
Start: 2015-09-08 — End: 2015-09-08

## 2015-09-08 MED ORDER — hydrocortisone sod succ (PF)(SOLU-CORTEF) 100 mg injection
100 | Freq: Every day | INTRAMUSCULAR | Status: AC | PRN
Start: 2015-09-08 — End: 2015-09-08

## 2015-09-08 MED ORDER — heparin lock flush 100 unit/mL Syrg 500 Units
100 | Freq: Every day | INTRAVENOUS | Status: AC | PRN
Start: 2015-09-08 — End: 2015-09-08

## 2015-09-08 MED ORDER — immune globulin (human) (IgG) (GAMMAGARD) Soln 25 g
10 | Freq: Once | INTRAMUSCULAR | Status: AC
Start: 2015-09-08 — End: 2015-09-08
  Administered 2015-09-08: 18:00:00 25 g/kg via INTRAVENOUS

## 2015-09-08 MED ORDER — albuterol (PROVENTIL;VENTOLIN;PROAIR) inhaler 1-2 puff
90 | RESPIRATORY_TRACT | Status: AC | PRN
Start: 2015-09-08 — End: 2015-09-08

## 2015-09-08 MED FILL — TYLENOL 325 MG TABLET: 325 325 mg | ORAL | Qty: 2

## 2015-09-08 MED FILL — GAMMAGARD LIQUID 10 % INJECTION SOLUTION: 10 10 % | INTRAMUSCULAR | Qty: 250

## 2015-09-08 MED FILL — DIPHENHYDRAMINE 25 MG CAPSULE: 25 25 mg | ORAL | Qty: 1

## 2015-09-08 NOTE — Unmapped (Signed)
See dictated note

## 2015-09-08 NOTE — Unmapped (Signed)
After MD appt, pt here for IVIG. See MD visit note for Vitals, Allergy and Medication review.Informed consent verified.Labs verified to be in normal range for ordered treatment Pt tolerated treatment without difficulty. Discharged ambulatory, given AVS.

## 2015-09-09 NOTE — Unmapped (Signed)
DIAGNOSIS(ES):    1. Left-sided breast cancer, T1, N0.  2. Non-Hodgkin's lymphoma.      [name] is here for follow-up.  Overall she is doing well.  She does have an open area on her leg which does not appear to be infected but it is very slow healing.  She did go to wound management in the past but the physician she was seeing recently retired.  I have told her if things are not better in the next couple weeks or so she probably needs to see someone in wound management.  She is more fatigued.  Her hip replacement was less than 6 months ago, and she states she just feels like she is not back to normal.  We will check thyroid function, vitamin D, etc.  I did tell her that she certainly should call us if there are signs or symptoms of infection which today she does not report.  There has been no GI, GU, pulmonary, cardiac, neurologic, musculoskeletal, cutaneous, endocrine, psychiatric, hematologic, neurologic, infectious disease or other major difficulties.      Remainder of a 10-point review of systems is otherwise unchanged.    PHYSICAL EXAMINATION:  Vital signs per flow sheet.  She is alert, oriented and otherwise in no acute distress.  Throat is clear without ulceration or Candida.  Lungs are clear.  Cardiac exam: No murmur, rub or gallop.  No cervical, supra, infraclavicular adenopathy.  Abdomen is soft without ascites, jaundice or organomegaly.  There is no Jennilyn Esteve extremity edema.  She is not clubbed.  Skin without rash.  No ecchymoses.  Musculoskeletal: No active arthritis, no overt fracture.  Neurologic: Mental status alert and oriented.  Cranial nerves unchanged.  Motor and sensory unchanged.  Gait is unremarkable.  She is not using an assist device.  Psychiatric: She is not overly anxious or depressed.      Laboratory tests as noted.    IMPRESSION:  Doing fairly well.    PLAN:    1. Thyroid function, vitamin D, etc., will be checked.  2. Copies of blood work given.  3. The patient is due for mammogram, DEXA  scan in the fall.  These have been ordered for her.    4. I did discuss bone health issues including calcium, vitamin D, weightbearing exercise.    5. Questions addressed.    6. She will continue IVIG.    EEL/ja  [phone]

## 2015-10-02 MED ORDER — fluticasone-salmeterol (ADVAIR) 250-50 mcg/dose diskus inhaler
250-50 | Freq: Two times a day (BID) | RESPIRATORY_TRACT | 5 refills | Status: AC
Start: 2015-10-02 — End: 2017-03-04

## 2015-10-13 ENCOUNTER — Ambulatory Visit: Payer: MEDICARE

## 2015-10-13 ENCOUNTER — Ambulatory Visit: Payer: MEDICARE | Attending: Hematology & Oncology

## 2015-10-13 NOTE — Progress Notes (Signed)
Ashley Madden scheduled for MD visit and IVIG infusion today. Pt no show.

## 2015-10-22 ENCOUNTER — Ambulatory Visit: Admit: 2015-10-22 | Payer: MEDICARE

## 2015-10-22 ENCOUNTER — Inpatient Hospital Stay: Admit: 2015-10-22 | Payer: MEDICARE | Attending: Hematology & Oncology

## 2015-10-22 ENCOUNTER — Ambulatory Visit: Admit: 2015-10-22 | Payer: MEDICARE | Attending: Hematology & Oncology

## 2015-10-22 ENCOUNTER — Other Ambulatory Visit: Admit: 2015-10-22 | Payer: MEDICARE

## 2015-10-22 DIAGNOSIS — C50919 Malignant neoplasm of unspecified site of unspecified female breast: Secondary | ICD-10-CM

## 2015-10-22 DIAGNOSIS — Z5112 Encounter for antineoplastic immunotherapy: Secondary | ICD-10-CM

## 2015-10-22 DIAGNOSIS — D809 Immunodeficiency with predominantly antibody defects, unspecified: Secondary | ICD-10-CM

## 2015-10-22 DIAGNOSIS — D803 Selective deficiency of immunoglobulin G [IgG] subclasses: Secondary | ICD-10-CM

## 2015-10-22 LAB — COMPREHENSIVE METABOLIC PANEL
ALT: 13 U/L (ref 7–52)
AST: 21 U/L (ref 13–39)
Albumin: 4.6 g/dL (ref 3.5–5.7)
Alkaline Phosphatase: 71 U/L (ref 36–125)
Anion Gap: 9 mmol/L (ref 3–16)
BUN: 20 mg/dL (ref 7–25)
CO2: 26 mmol/L (ref 21–33)
Calcium: 9.9 mg/dL (ref 8.6–10.3)
Chloride: 104 mmol/L (ref 98–110)
Creatinine: 0.7 mg/dL (ref 0.60–1.30)
Glucose: 100 mg/dL (ref 70–100)
Osmolality, Calculated: 291 mOsm/kg (ref 278–305)
Potassium: 4.1 mmol/L (ref 3.5–5.3)
Sodium: 139 mmol/L (ref 133–146)
Total Bilirubin: 1 mg/dL (ref 0.0–1.5)
Total Protein: 6.7 g/dL (ref 6.4–8.9)
eGFR AA CKD-EPI: >90 See note.
eGFR NONAA CKD-EPI: 80 See note.

## 2015-10-22 LAB — CBC
Hematocrit: 40.7 % (ref 35.0–45.0)
Hemoglobin: 13.7 g/dL (ref 11.7–15.5)
MCH: 29.4 pg (ref 27.0–33.0)
MCHC: 33.6 g/dL (ref 32.0–36.0)
MCV: 87.3 fL (ref 80.0–100.0)
MPV: 10.5 fL (ref 7.5–11.5)
Platelets: 122 10E3/uL — ABNORMAL LOW (ref 140–400)
RBC: 4.66 10E6/uL (ref 3.80–5.10)
RDW: 14.6 % (ref 11.0–15.0)
WBC: 4.2 10E3/uL (ref 3.8–10.8)

## 2015-10-22 LAB — KAPPA/LAMBDA, FREE LIGHT CHAINS, SERUM
Kappa/Lambda Ratio: 0.76 ratio (ref 0.26–1.65)
Kappa: 7.7 mg/L (ref 3.3–19.4)
Lambda: 10.1 mg/L (ref 5.7–26.3)

## 2015-10-22 LAB — TSH: TSH: 0.21 u[IU]/mL (ref 0.34–5.60)

## 2015-10-22 LAB — IGG: IgG: 812 mg/dL (ref 751.0–1560.0)

## 2015-10-22 LAB — VITAMIN D 25 HYDROXY: Vit D, 25-Hydroxy: 33.8 ng/mL (ref 30.0–100)

## 2015-10-22 LAB — IGA: IgA: 77.7 mg/dL — ABNORMAL LOW (ref 82.0–453.0)

## 2015-10-22 LAB — IGM: IgM: 84.5 mg/dL (ref 46.0–304.0)

## 2015-10-22 LAB — T4: T4, Total: 11.1 ug/dL (ref 4.60–11.50)

## 2015-10-22 MED ORDER — hydrocortisone sod succ (PF)(SOLU-CORTEF) 100 mg injection
100 | Freq: Every day | INTRAMUSCULAR | Status: AC | PRN
Start: 2015-10-22 — End: 2015-10-22

## 2015-10-22 MED ORDER — diphenhydrAMINE (BENADRYL) injection 25 mg
50 | Freq: Every day | INTRAMUSCULAR | Status: AC | PRN
Start: 2015-10-22 — End: 2015-10-22

## 2015-10-22 MED ORDER — sodium chloride 0.9 % infusion
Freq: Every day | INTRAVENOUS | Status: AC | PRN
Start: 2015-10-22 — End: 2015-10-22
  Administered 2015-10-22: 18:00:00 25 mL/h via INTRAVENOUS

## 2015-10-22 MED ORDER — immune globulin (human) (IgG) (GAMMAGARD) Soln 25 g
10 | Freq: Once | INTRAMUSCULAR | Status: AC
Start: 2015-10-22 — End: 2015-10-22
  Administered 2015-10-22: 19:00:00 25 g/kg via INTRAVENOUS

## 2015-10-22 MED ORDER — diphenhydrAMINE (BENADRYL) capsule 25 mg
25 | Freq: Once | ORAL | Status: AC
Start: 2015-10-22 — End: 2015-10-22
  Administered 2015-10-22: 18:00:00 25 mg via ORAL

## 2015-10-22 MED ORDER — sodium chloride 0.9 % flush 10 mL
Freq: Every day | INTRAMUSCULAR | Status: AC | PRN
Start: 2015-10-22 — End: 2015-10-22

## 2015-10-22 MED ORDER — heparin lock flush Syrg 500 Units
100 | Freq: Every day | INTRAVENOUS | Status: AC | PRN
Start: 2015-10-22 — End: 2015-10-22

## 2015-10-22 MED ORDER — EPINEPHrine (ADRENALIN) 1 mg/mL injection 0.3 mg
1 | Freq: Every day | INTRAMUSCULAR | Status: AC | PRN
Start: 2015-10-22 — End: 2015-10-22

## 2015-10-22 MED ORDER — acetaminophen (TYLENOL) tablet 650 mg
325 | Freq: Once | ORAL | Status: AC
Start: 2015-10-22 — End: 2015-10-22
  Administered 2015-10-22: 18:00:00 650 mg via ORAL

## 2015-10-22 MED ORDER — albuterol (PROVENTIL;VENTOLIN;PROAIR) inhaler 1-2 puff
90 | RESPIRATORY_TRACT | Status: AC | PRN
Start: 2015-10-22 — End: 2015-10-22

## 2015-10-22 MED FILL — TYLENOL 325 MG TABLET: 325 325 mg | ORAL | Qty: 2

## 2015-10-22 MED FILL — DIPHENHYDRAMINE 25 MG CAPSULE: 25 25 mg | ORAL | Qty: 1

## 2015-10-22 MED FILL — GAMMAGARD LIQUID 10 % INJECTION SOLUTION: 10 10 % | INTRAMUSCULAR | Qty: 250

## 2015-10-22 NOTE — Unmapped (Signed)
DIAGNOSIS(ES):  Breast cancer, non-Hodgkin's lymphoma, IgG deficiency.      [name] is here for follow up, continuation of IVIG.  Overall she is doing really pretty well.  She and her husband have many vacation trips planned.  She is going to be spending a couple weeks in [place] and she also is going to be spending most of the winter back in [place].  She will need to re-contact Dr. Lyman Bishop in her office regarding reinstitution of IVIG.  She is a couple weeks late here.  She will get her IVIG here and then next month get it back in [place].  She has had no intercurrent signs of infection recently.  No new GI, GU, pulmonary, cardiac, neurologic, musculoskeletal, cutaneous, endocrine, psychiatric, hematologic, infectious disease or other major difficulties.  For the most part, things are going well.  She is aware she needs flu vaccine.  She is having no new pain.  She had mammogram and DEXA scan today, results of which are both still pending.  Remainder of a 10-point review of systems is otherwise unchanged.  No other GI, GU, pulmonary, cardiac, neurologic, musculoskeletal, cutaneous, endocrine, psychiatric, hematologic or other difficulties.    PHYSICAL EXAMINATION:  Vital signs per flow sheet.  She is alert, oriented and otherwise in no acute distress.  Throat is clear without ulceration or Candida.  Lungs are clear.  Cardiac exam - no murmur, rub or gallop.  No cervical, supra, infraclavicular adenopathy.  Chest wall is unchanged.  Breast area was not examined today.  Abdomen is soft without ascites, jaundice or organomegaly.  There is no Eulises Kijowski extremity edema.  She is not clubbed.  Skin shows no rash.  No ecchymoses.  Musculoskeletal - no active arthritis, no overt fracture.  Neurologic - mental status alert and oriented.  Cranial nerves unchanged.  Motor and sensory is unchanged.  Gait is unremarkable.  She is not using an assist device.  Psychiatric - she is not overly anxious or depressed.      Laboratory tests  all per flow sheet.    IMPRESSION:  Clinically doing well.    PLAN:    1. The patient will continue current observation.  2. Copies of blood work given and reviewed.  3. Survivorship issues including healthy habits, healthy lifestyle discussed.    4. The patient will contact us for other additional problems and she was given copies of blood work.  She will be notified regarding DEXA and mammogram once available.    EEL/kmh  [id number]

## 2015-10-22 NOTE — Progress Notes (Signed)
See dictated note

## 2015-10-22 NOTE — Progress Notes (Signed)
After MD appt, pt here for IVIG.Pt not a fall risk on assessment.See MD visit note for Vitals, Allergy and Medication review.Labs verified to be in normal range for ordered treatmentLabs verified to be in normal range for ordered treatment .Pt tolerated treatment without difficulty. Discharged ambulatory, given AVS.

## 2015-10-23 NOTE — Telephone Encounter (Signed)
Patient notified result of mammogram screen was negative.

## 2015-10-24 NOTE — Progress Notes (Signed)
Spoke with radiologist who read scan. Dr. Elijah Birk was asked to contact Dr. Samara Snide regarding resulted information explanation. Will f/u to make patient aware.

## 2015-10-24 NOTE — Telephone Encounter (Signed)
Per patient's request, [facility] faxed Dr. Chinita Greenland office notes from 07/28/15, 09/08/15, and 10/22/15; mammogram and dexa results to Dr. Caryl Comes office in [facility] at [phone]. Received confirmation that the fax went through.

## 2015-10-27 NOTE — Unmapped (Signed)
-----   Message from [name], MD sent at 10/23/2015  8:45 PM EDT -----   Notify -- dexa worse needs new drugs ???  prolia  ----- Message -----  From: Interface, Results In  Sent: 10/22/2015   2:58 PM  To: [name], MD

## 2015-10-27 NOTE — Telephone Encounter (Signed)
This RN spoke with Dr. Oletta Lamas ( [facility]) in re. of his report/clarification of patients Dexa scan. Dr. Hattie Perch will contact Dr. Samara Snide and discuss this matter

## 2015-10-28 NOTE — Telephone Encounter (Signed)
Pt asked if she needs to be started on any new medications as her Dexa scan showed worsening Osteoporosis? Addressed question with MD Lower and awaiting a response. Will update pt accordingly.

## 2015-10-30 MED ORDER — alendronate (FOSAMAX) 70 MG tablet
70 | ORAL_TABLET | ORAL | 3 refills | 90.00000 days | Status: AC
Start: 2015-10-30 — End: 2016-04-02

## 2015-10-30 NOTE — Telephone Encounter (Signed)
Received call from patient in re. Of Fosamax. Patient provided with instructions on medication and voiced understanding.

## 2015-10-30 NOTE — Telephone Encounter (Signed)
Attempted to call patient several times for patient requesting if she needs to take a medication for osteoporosis related to her DXA test. Dr. Samara Snide advised Fosamax. Medication Rx sent to Dr. Samara Snide for approval. Awaiting return call from patient. Furthermore, patient stated she is leaving for [place] at the end of the week and will return next summer.

## 2015-10-30 NOTE — Unmapped (Signed)
-----   Message from [name], MD sent at 10/23/2015  8:45 PM EDT -----   Notify -- dexa worse needs new drugs ???  prolia  ----- Message -----  From: Interface, Results In  Sent: 10/22/2015   2:58 PM  To: [name], MD

## 2016-01-08 MED ORDER — atenolol (TENORMIN) 50 MG tablet
50 | ORAL_TABLET | Freq: Every day | ORAL | 3 refills | Status: AC
Start: 2016-01-08 — End: 2017-03-07

## 2016-04-02 ENCOUNTER — Ambulatory Visit: Admit: 2016-04-02 | Payer: MEDICARE | Attending: Hematology & Oncology

## 2016-04-02 ENCOUNTER — Other Ambulatory Visit: Admit: 2016-04-02 | Payer: MEDICARE

## 2016-04-02 ENCOUNTER — Ambulatory Visit: Admit: 2016-04-02 | Payer: MEDICARE

## 2016-04-02 DIAGNOSIS — Z5112 Encounter for antineoplastic immunotherapy: Secondary | ICD-10-CM

## 2016-04-02 DIAGNOSIS — C50919 Malignant neoplasm of unspecified site of unspecified female breast: Secondary | ICD-10-CM

## 2016-04-02 DIAGNOSIS — D803 Selective deficiency of immunoglobulin G [IgG] subclasses: Secondary | ICD-10-CM

## 2016-04-02 LAB — COMPREHENSIVE METABOLIC PANEL
ALT: 26 U/L (ref 7–52)
AST: 26 U/L (ref 13–39)
Albumin: 4.1 g/dL (ref 3.5–5.7)
Alkaline Phosphatase: 65 U/L (ref 36–125)
Anion Gap: 6 mmol/L (ref 3–16)
BUN: 18 mg/dL (ref 7–25)
CO2: 26 mmol/L (ref 21–33)
Calcium: 9.2 mg/dL (ref 8.6–10.3)
Chloride: 107 mmol/L (ref 98–110)
Creatinine: 0.63 mg/dL (ref 0.60–1.30)
Glucose: 107 mg/dL (ref 70–100)
Osmolality, Calculated: 290 mOsm/kg (ref 278–305)
Potassium: 4.2 mmol/L (ref 3.5–5.3)
Sodium: 139 mmol/L (ref 133–146)
Total Bilirubin: 0.9 mg/dL (ref 0.0–1.5)
Total Protein: 6.2 g/dL (ref 6.4–8.9)
eGFR AA CKD-EPI: >90 See note.
eGFR NONAA CKD-EPI: 83 See note.

## 2016-04-02 LAB — CBC
Hematocrit: 38.8 % (ref 35.0–45.0)
Hemoglobin: 12.9 g/dL (ref 11.7–15.5)
MCH: 29.2 pg (ref 27.0–33.0)
MCHC: 33.3 g/dL (ref 32.0–36.0)
MCV: 87.7 fL (ref 80.0–100.0)
MPV: 9.4 fL (ref 7.5–11.5)
Platelets: 144 10*3/uL (ref 140–400)
RBC: 4.43 10*6/uL (ref 3.80–5.10)
RDW: 14.8 % (ref 11.0–15.0)
WBC: 4.2 10*3/uL (ref 3.8–10.8)

## 2016-04-02 LAB — DIFFERENTIAL
Basophils Absolute: 4 /uL (ref 0–200)
Basophils Relative: 0.1 % (ref 0.0–1.0)
Eosinophils Absolute: 46 /uL (ref 15–500)
Eosinophils Relative: 1.1 % (ref 0.0–8.0)
Lymphocytes Absolute: 701 /uL (ref 850–3900)
Lymphocytes Relative: 16.7 % (ref 15.0–45.0)
Monocytes Absolute: 563 /uL (ref 200–950)
Monocytes Relative: 13.4 % (ref 0.0–12.0)
Neutrophils Absolute: 2885 /uL (ref 1500–7800)
Neutrophils Relative: 68.7 % (ref 40.0–80.0)
nRBC: 0 /100 WBC (ref 0–0)

## 2016-04-02 MED ORDER — diphenhydrAMINE (BENADRYL) injection 25 mg
50 | Freq: Every day | INTRAMUSCULAR | Status: AC | PRN
Start: 2016-04-02 — End: 2016-04-02

## 2016-04-02 MED ORDER — diphenhydrAMINE (BENADRYL) capsule 25 mg
25 | Freq: Once | ORAL | Status: AC
Start: 2016-04-02 — End: 2016-04-02
  Administered 2016-04-02: 17:00:00 25 mg via ORAL

## 2016-04-02 MED ORDER — sodium chloride flush 10 mL
Freq: Every day | INTRAMUSCULAR | Status: AC | PRN
Start: 2016-04-02 — End: 2016-04-02

## 2016-04-02 MED ORDER — albuterol (PROVENTIL;VENTOLIN;PROAIR) inhaler 1-2 puff
90 | RESPIRATORY_TRACT | Status: AC | PRN
Start: 2016-04-02 — End: 2016-04-02

## 2016-04-02 MED ORDER — sodium chloride 0.9 % infusion
Freq: Every day | INTRAVENOUS | Status: AC | PRN
Start: 2016-04-02 — End: 2016-04-02
  Administered 2016-04-02: 17:00:00 25 mL/h via INTRAVENOUS

## 2016-04-02 MED ORDER — hydrocortisone sod succ (PF)(SOLU-CORTEF) 100 mg injection
100 | Freq: Every day | INTRAMUSCULAR | Status: AC | PRN
Start: 2016-04-02 — End: 2016-04-02

## 2016-04-02 MED ORDER — heparin lock flush Syrg 500 Units
100 | Freq: Every day | INTRAVENOUS | Status: AC | PRN
Start: 2016-04-02 — End: 2016-04-02

## 2016-04-02 MED ORDER — EPINEPHrine (ADRENALIN) 1 mg/mL injection 0.3 mg
1 | Freq: Every day | INTRAMUSCULAR | Status: AC | PRN
Start: 2016-04-02 — End: 2016-04-02

## 2016-04-02 MED ORDER — immune globulin (human) (IgG) (GAMMAGARD) Soln 25 g
10 | Freq: Once | INTRAMUSCULAR | Status: AC
Start: 2016-04-02 — End: 2016-04-02
  Administered 2016-04-02: 17:00:00 25 g/kg via INTRAVENOUS

## 2016-04-02 MED ORDER — acetaminophen (TYLENOL) tablet 650 mg
325 | Freq: Once | ORAL | Status: AC
Start: 2016-04-02 — End: 2016-04-02
  Administered 2016-04-02: 17:00:00 650 mg via ORAL

## 2016-04-02 MED FILL — GAMMAGARD LIQUID 10 % INJECTION SOLUTION: 10 10 % | INTRAMUSCULAR | Qty: 250

## 2016-04-02 MED FILL — TYLENOL 325 MG TABLET: 325 325 mg | ORAL | Qty: 2

## 2016-04-02 MED FILL — DIPHENHYDRAMINE 25 MG CAPSULE: 25 25 mg | ORAL | Qty: 1

## 2016-04-02 NOTE — Unmapped (Signed)
I, Dr. Dionisio Paschal Glendel Jaggers, saw Ashley Madden on 04/02/16.    The following is my dictated note detailing the patient's symptoms, examination, assessment, and plan that I performed during this visit.    DIAGNOSIS(ES):  Left-sided breast cancer T1N0M0, history of non-Hodgkin's lymphoma, history of chronic immunoglobulin deficiency, now on IVIG approximately every 6 to 8 weeks.      Ashley Madden is here for follow-up and IVIG.  She spent most of her winter in [place].  Luckily, she had no signs of intercurrent infection.  No other new GI, GU, pulmonary, cardiac, neurologic, musculoskeletal, cutaneous, endocrine, psychiatric, hematologic or other major difficulties.  In general, things have been going along with her very well.  She actually will be back here before too long because she has a granddaughter getting married in June.  She has a lot of issues with dry skin.  This has been a longstanding problem.  We discussed using oilier moisturizers which may be better than the usual lotions that she has tried. No other GI, GU, pulmonary, cardiac, neurologic, musculoskeletal, cutaneous, endocrine, psychiatric, hematologic or other major problems.      Remainder of a 10-point review of systems is unchanged.    PHYSICAL EXAMINATION:  Vital signs per flow sheet.  She is alert and oriented and otherwise in no acute distress.  Throat is clear without ulceration or Candida.  Lungs are clear.  Cardiac exam- no murmur, rub or gallop.  No cervical, supra, infraclavicular adenopathy.  Abdomen is soft without ascites, jaundice or organomegaly.  There is no Zamari Vea extremity edema.  She is not clubbed.  Skin shows no rash. No ecchymoses.  Musculoskeletal- no active arthritis, no overt fracture.  Neurologic- mental status alert and oriented.  Cranial nerves unchanged.  Motor and sensory unchanged.  Gait is unremarkable.  She is not using an assist device.  Psychiatric- she is not overly anxious or depressed.      Laboratory tests all per flow  sheet.    IMPRESSION:  Doing very well.    PLAN:    1. Patient will continue current observation and IVIG.   2. Copies of blood work given and reviewed.  3. She is up-to-date with both mammogram, DEXA scan which were performed last fall and were reviewed with her.  She return as directed.     EEL/pkd  [id number]

## 2016-04-02 NOTE — Progress Notes (Signed)
See dictated note

## 2016-04-02 NOTE — Progress Notes (Signed)
Pt arrived ambulatory to infusion room for scheduled tx. Administered #22 to pt's RAC without incident. BR/flush noted. Administered IVIG as ordered without incident. Pt tolerated all well and denied other needs. AVS given to pt. Pt will call to schedule next appointment when she returns from house in [place]. PIV removed and gauze/band-aid applied. Discharged ambulatory per self.

## 2016-07-15 ENCOUNTER — Ambulatory Visit: Admit: 2016-07-15 | Payer: MEDICARE

## 2016-07-15 ENCOUNTER — Ambulatory Visit: Admit: 2016-07-15 | Payer: MEDICARE | Attending: Hematology & Oncology

## 2016-07-15 ENCOUNTER — Other Ambulatory Visit: Admit: 2016-07-15 | Payer: MEDICARE

## 2016-07-15 DIAGNOSIS — D809 Immunodeficiency with predominantly antibody defects, unspecified: Secondary | ICD-10-CM

## 2016-07-15 DIAGNOSIS — D803 Selective deficiency of immunoglobulin G [IgG] subclasses: Secondary | ICD-10-CM

## 2016-07-15 DIAGNOSIS — C50919 Malignant neoplasm of unspecified site of unspecified female breast: Secondary | ICD-10-CM

## 2016-07-15 LAB — COMPREHENSIVE METABOLIC PANEL
ALT: 25 U/L (ref 7–52)
AST: 25 U/L (ref 13–39)
Albumin: 3.9 g/dL (ref 3.5–5.7)
Alkaline Phosphatase: 64 U/L (ref 36–125)
Anion Gap: 7 mmol/L (ref 3–16)
BUN: 19 mg/dL (ref 7–25)
CO2: 27 mmol/L (ref 21–33)
Calcium: 9.3 mg/dL (ref 8.6–10.3)
Chloride: 107 mmol/L (ref 98–110)
Creatinine: 0.76 mg/dL (ref 0.60–1.30)
Glucose: 93 mg/dL (ref 70–100)
Osmolality, Calculated: 294 mOsm/kg (ref 278–305)
Potassium: 3.9 mmol/L (ref 3.5–5.3)
Sodium: 141 mmol/L (ref 133–146)
Total Bilirubin: 0.7 mg/dL (ref 0.0–1.5)
Total Protein: 6.2 g/dL (ref 6.4–8.9)
eGFR AA CKD-EPI: 83 See note.
eGFR NONAA CKD-EPI: 72 See note.

## 2016-07-15 LAB — CBC
Hematocrit: 38.2 % (ref 35.0–45.0)
Hemoglobin: 12.8 g/dL (ref 11.7–15.5)
MCH: 29.7 pg (ref 27.0–33.0)
MCHC: 33.4 g/dL (ref 32.0–36.0)
MCV: 89 fL (ref 80.0–100.0)
MPV: 10.4 fL (ref 7.5–11.5)
Platelets: 116 10*3/uL (ref 140–400)
RBC: 4.29 10*6/uL (ref 3.80–5.10)
RDW: 14.8 % (ref 11.0–15.0)
WBC: 3.7 10*3/uL (ref 3.8–10.8)

## 2016-07-15 LAB — DIFFERENTIAL
Basophils Absolute: 7 /uL (ref 0–200)
Basophils Relative: 0.2 % (ref 0.0–1.0)
Eosinophils Absolute: 48 /uL (ref 15–500)
Eosinophils Relative: 1.3 % (ref 0.0–8.0)
Lymphocytes Absolute: 636 /uL (ref 850–3900)
Lymphocytes Relative: 17.2 % (ref 15.0–45.0)
Monocytes Absolute: 555 /uL (ref 200–950)
Monocytes Relative: 15 % (ref 0.0–12.0)
Neutrophils Absolute: 2453 /uL (ref 1500–7800)
Neutrophils Relative: 66.3 % (ref 40.0–80.0)
nRBC: 0 /100 WBC (ref 0–0)

## 2016-07-15 MED ORDER — heparin lock flush Syrg 500 Units
100 | Freq: Every day | INTRAVENOUS | Status: AC | PRN
Start: 2016-07-15 — End: 2016-07-15

## 2016-07-15 MED ORDER — hydrocortisone sod succ (PF)(SOLU-CORTEF) 100 mg injection
100 | Freq: Every day | INTRAMUSCULAR | Status: AC | PRN
Start: 2016-07-15 — End: 2016-07-15

## 2016-07-15 MED ORDER — sodium chloride flush 10 mL
Freq: Every day | INTRAMUSCULAR | Status: AC | PRN
Start: 2016-07-15 — End: 2016-07-15

## 2016-07-15 MED ORDER — albuterol (PROVENTIL;VENTOLIN;PROAIR) inhaler 1-2 puff
90 | RESPIRATORY_TRACT | Status: AC | PRN
Start: 2016-07-15 — End: 2016-07-15

## 2016-07-15 MED ORDER — diphenhydrAMINE (BENADRYL) injection 25 mg
50 | Freq: Every day | INTRAMUSCULAR | Status: AC | PRN
Start: 2016-07-15 — End: 2016-07-15

## 2016-07-15 MED ORDER — acetaminophen (TYLENOL) tablet 650 mg
325 | Freq: Once | ORAL | Status: AC
Start: 2016-07-15 — End: 2016-07-15
  Administered 2016-07-15: 21:00:00 650 mg via ORAL

## 2016-07-15 MED ORDER — immune globulin (human) (IgG) (GAMMAGARD) Soln 25 g
10 | Freq: Once | INTRAMUSCULAR | Status: AC
Start: 2016-07-15 — End: 2016-07-15
  Administered 2016-07-15: 22:00:00 25 g/kg via INTRAVENOUS

## 2016-07-15 MED ORDER — EPINEPHrine (ADRENALIN) 1 mg/mL injection 0.3 mg
1 | Freq: Every day | INTRAMUSCULAR | Status: AC | PRN
Start: 2016-07-15 — End: 2016-07-15

## 2016-07-15 MED ORDER — sodium chloride 0.9 % infusion
Freq: Every day | INTRAVENOUS | Status: AC | PRN
Start: 2016-07-15 — End: 2016-07-15
  Administered 2016-07-15: 22:00:00 25 mL/h via INTRAVENOUS

## 2016-07-15 MED ORDER — diphenhydrAMINE (BENADRYL) capsule 25 mg
25 | Freq: Once | ORAL | Status: AC
Start: 2016-07-15 — End: 2016-07-15
  Administered 2016-07-15: 21:00:00 25 mg via ORAL

## 2016-07-15 MED FILL — GAMMAGARD LIQUID 10 % INJECTION SOLUTION: 10 10 % | INTRAMUSCULAR | Qty: 250

## 2016-07-15 NOTE — Unmapped (Signed)
I, Dr. Dionisio Paschal Jentri Aye, saw Ashley Madden on 07/15/2016.    The following is my dictated note detailing the patient's symptoms, examination, assessment and plan that I performed during this visit.      DIAGNOSIS(ES):    1. Breast cancer, T1, N0, M0.    2. Non-Hodgkin's lymphoma.    3. History of IgG deficiency.      Ashley Madden is here for followup and continuation of IVIG.  Somehow she got scheduled on an off day today.  She feels well.  She just returned from spending most of the winter in [place].  She was receiving her IVIG every 6 weeks while down there.  She has had no signs of intercurrent infection.  She still has issues with her skin.  It is very easy for her to get bruising and sometimes squamous cell carcinomas for which she is followed by derm, etc.  She feels well otherwise.  She is due for mammogram in October.  We did discuss bone health issues.  She is walking.  She and her family were able to take a vacation trip to [place], which they thoroughly enjoyed.  Last DEXA was in October of 2017.  She is aware of bone health issues.  Remainder of a 10-point review of systems is pretty much unchanged.  No other new GI, GU, pulmonary, cardiac, neurologic, musculoskeletal, cutaneous, endocrine, psychiatric, hematologic, infectious disease.    PHYSICAL EXAMINATION:  Vital signs as noted.  She is alert, oriented and otherwise in no acute distress.  She is accompanied today as usual by her husband.  Throat is clear without ulceration or Candida.  Lungs are clear.  Cardiac exam is unchanged.  Breast area was not examined today.  Abdomen is soft.  No ascites, jaundice or organomegaly.  There is no Shakeda Pearse extremity edema.  She is not clubbed.  Skin shows an occasional area of ecchymoses, no hematoma.  There is a red area which will be followed by derm.  Musculoskeletal- No change in arthritis, no overt fracture.  Neurologic- Mental status, alert and oriented.  Cranial nerves unchanged.  Motor and sensory unchanged.  Gait is  unremarkable.  She is not using an assist device.  Psychiatric- She is not overly anxious or depressed.      Laboratory tests all per flow sheet.    IMPRESSION:  Doing well.    PLAN:    1. Patient will continue IVIG.  2. We discussed other follow-up strategies.  Testing will be due in the fall.    3. Copies of blood work reviewed.    4. She will contact us for additional problems and return as noted.    EEL/nwc   [id number]

## 2016-07-15 NOTE — Unmapped (Signed)
Arrived ambulatory and alert and oriented X 3 for IVIG after OV with Dr. Samara Snide.  Fall risk assessment completed.  No fall risk.  Call light placed within reach of patient.  Treatment administered as ordered.  Tolerated infusion well and left department ambulatory.

## 2016-07-15 NOTE — Unmapped (Signed)
See dicated note

## 2016-08-30 ENCOUNTER — Ambulatory Visit: Admit: 2016-08-30 | Payer: MEDICARE | Attending: Hematology & Oncology

## 2016-08-30 ENCOUNTER — Other Ambulatory Visit: Admit: 2016-08-30 | Payer: MEDICARE

## 2016-08-30 ENCOUNTER — Ambulatory Visit: Admit: 2016-08-30 | Discharge: 2016-08-30 | Payer: MEDICARE

## 2016-08-30 DIAGNOSIS — D803 Selective deficiency of immunoglobulin G [IgG] subclasses: Secondary | ICD-10-CM

## 2016-08-30 LAB — DIFFERENTIAL
Basophils Absolute: 8 /uL (ref 0–200)
Basophils Relative: 0.2 % (ref 0.0–1.0)
Eosinophils Absolute: 59 /uL (ref 15–500)
Eosinophils Relative: 1.4 % (ref 0.0–8.0)
Lymphocytes Absolute: 617 /uL (ref 850–3900)
Lymphocytes Relative: 14.7 % (ref 15.0–45.0)
Monocytes Absolute: 538 /uL (ref 200–950)
Monocytes Relative: 12.8 % (ref 0.0–12.0)
Neutrophils Absolute: 2978 /uL (ref 1500–7800)
Neutrophils Relative: 70.9 % (ref 40.0–80.0)
nRBC: 0 /100 WBC (ref 0–0)

## 2016-08-30 LAB — CBC
Hematocrit: 39.6 % (ref 35.0–45.0)
Hemoglobin: 13.4 g/dL (ref 11.7–15.5)
MCH: 29.7 pg (ref 27.0–33.0)
MCHC: 33.7 g/dL (ref 32.0–36.0)
MCV: 88.2 fL (ref 80.0–100.0)
MPV: 11.1 fL (ref 7.5–11.5)
Platelets: 117 10E3/uL — ABNORMAL LOW (ref 140–400)
RBC: 4.49 10E6/uL (ref 3.80–5.10)
RDW: 14.6 % (ref 11.0–15.0)
WBC: 4.2 10E3/uL (ref 3.8–10.8)

## 2016-08-30 LAB — COMPREHENSIVE METABOLIC PANEL
ALT: 23 U/L (ref 7–52)
AST: 23 U/L (ref 13–39)
Albumin: 4 g/dL (ref 3.5–5.7)
Alkaline Phosphatase: 60 U/L (ref 36–125)
Anion Gap: 6 mmol/L (ref 3–16)
BUN: 21 mg/dL (ref 7–25)
CO2: 26 mmol/L (ref 21–33)
Calcium: 9.6 mg/dL (ref 8.6–10.3)
Chloride: 105 mmol/L (ref 98–110)
Creatinine: 0.63 mg/dL (ref 0.60–1.30)
Glucose: 99 mg/dL (ref 70–100)
Osmolality, Calculated: 287 mosm/kg (ref 278–305)
Potassium: 4.2 mmol/L (ref 3.5–5.3)
Sodium: 137 mmol/L (ref 133–146)
Total Bilirubin: 0.7 mg/dL (ref 0.0–1.5)
Total Protein: 6.4 g/dL (ref 6.4–8.9)
eGFR AA CKD-EPI: >90 See note.
eGFR NONAA CKD-EPI: 82 See note.

## 2016-08-30 MED ORDER — immune globulin (human) (IgG) (GAMMAGARD) Soln 25 g
10 | Freq: Once | INTRAMUSCULAR | Status: AC
Start: 2016-08-30 — End: 2016-08-30
  Administered 2016-08-30: 18:00:00 25 g/kg via INTRAVENOUS

## 2016-08-30 MED ORDER — hydrocortisone sod succ (PF)(SOLU-CORTEF) 100 mg injection
100 | Freq: Every day | INTRAMUSCULAR | Status: AC | PRN
Start: 2016-08-30 — End: 2016-08-30

## 2016-08-30 MED ORDER — albuterol (PROVENTIL;VENTOLIN;PROAIR) inhaler 1-2 puff
90 | RESPIRATORY_TRACT | Status: AC | PRN
Start: 2016-08-30 — End: 2016-08-30

## 2016-08-30 MED ORDER — acetaminophen (TYLENOL) tablet 650 mg
325 | Freq: Once | ORAL | Status: AC
Start: 2016-08-30 — End: 2016-08-30
  Administered 2016-08-30: 17:00:00 650 mg via ORAL

## 2016-08-30 MED ORDER — heparin lock flush Syrg 500 Units
100 | Freq: Every day | INTRAVENOUS | Status: AC | PRN
Start: 2016-08-30 — End: 2016-08-30

## 2016-08-30 MED ORDER — EPINEPHrine (ADRENALIN) 1 mg/mL injection 0.3 mg
1 | Freq: Every day | INTRAMUSCULAR | Status: AC | PRN
Start: 2016-08-30 — End: 2016-08-30

## 2016-08-30 MED ORDER — diphenhydrAMINE (BENADRYL) injection 25 mg
50 | Freq: Every day | INTRAMUSCULAR | Status: AC | PRN
Start: 2016-08-30 — End: 2016-08-30

## 2016-08-30 MED ORDER — sodium chloride flush 10 mL
Freq: Every day | INTRAMUSCULAR | Status: AC | PRN
Start: 2016-08-30 — End: 2016-08-30

## 2016-08-30 MED ORDER — sodium chloride 0.9 % infusion
Freq: Every day | INTRAVENOUS | Status: AC | PRN
Start: 2016-08-30 — End: 2016-08-30
  Administered 2016-08-30: 18:00:00 25 mL/h via INTRAVENOUS

## 2016-08-30 MED ORDER — diphenhydrAMINE (BENADRYL) capsule 25 mg
25 | Freq: Once | ORAL | Status: AC
Start: 2016-08-30 — End: 2016-08-30
  Administered 2016-08-30: 17:00:00 25 mg via ORAL

## 2016-08-30 MED FILL — GAMMAGARD LIQUID 10 % INJECTION SOLUTION: 10 10 % | INTRAMUSCULAR | Qty: 250

## 2016-08-30 MED FILL — DIPHENHYDRAMINE 25 MG CAPSULE: 25 25 mg | ORAL | Qty: 1

## 2016-08-30 MED FILL — TYLENOL 325 MG TABLET: 325 325 mg | ORAL | Qty: 2

## 2016-08-30 NOTE — Progress Notes (Signed)
Arrived ambulatory for IVIG.  Fall risk assessment completed.  No fall risk.  Call light placed within reach of patient.  Treatment administered as ordered.  Tolerated infusion well and left department ambulatory.

## 2016-08-30 NOTE — Progress Notes (Signed)
See dictated note

## 2016-08-30 NOTE — Unmapped (Signed)
I, Dr. Dionisio Paschal Meridian Scherger, saw Ashley Madden on 08/30/2016.     The following is my dictated note detailing the patient's symptoms, examination, assessment, and plan that I performed during this visit.     DIAGNOSIS(ES):    1. Left-sided breast cancer T1N0M0.   2. NonHodgkin's lymphoma.   3. History of IgG deficiency.      Ashley Madden is here for follow-up.  Overall she is doing well.  She has had no increased cough, shortness of breath, GI, GU, pulmonary, cardiac, neurologic, musculoskeletal, cutaneous, endocrine, psychiatric, hematologic or other major difficulties.  She has otherwise been doing well.  Unfortunately her brother-in-law recently died.     PHYSICAL EXAMINATION:  Vital signs as noted.  She is alert, oriented and otherwise in no acute distress.  Throat is clear without ulceration or Candida.  Lungs are clear.  Cardiac exam- no murmur, rub or gallop change.  No cervical, supra, infraclavicular adenopathy.  Breast area was not examined.  Abdomen is soft.  No ascites, jaundice or organomegaly.  No Delante Karapetyan extremity edema.  She is not clubbed.  Skin without rash or ecchymoses.  Musculoskeletal- no change in arthritis, no overt fracture.  Neurologic- mental status alert and oriented.  Cranial nerves unchanged.  Motor and sensory unchanged.  Gait is negative.  She is not using an assist device.  Psychiatric- she is not overly anxious or depressed.    Laboratory tests as noted.    IMPRESSION:  Doing well.    PLAN:    1. She will continue with IVIG.   2. Mammogram is due in October.  We have ordered this for her.   3. Bone health issues addressed, calcium, vitamin D, weightbearing exercise.  Last DEXA was in October of 2017.    4. We did extensively discuss her grief process. She is doing well with things but worried about other family members including her husband.     EEL/tlm   [id number]

## 2016-10-11 ENCOUNTER — Ambulatory Visit: Admit: 2016-10-11 | Payer: MEDICARE | Attending: Hematology & Oncology

## 2016-10-11 ENCOUNTER — Other Ambulatory Visit: Admit: 2016-10-11 | Payer: MEDICARE

## 2016-10-11 ENCOUNTER — Ambulatory Visit: Admit: 2016-10-11 | Payer: MEDICARE

## 2016-10-11 DIAGNOSIS — C50919 Malignant neoplasm of unspecified site of unspecified female breast: Secondary | ICD-10-CM

## 2016-10-11 DIAGNOSIS — D803 Selective deficiency of immunoglobulin G [IgG] subclasses: Secondary | ICD-10-CM

## 2016-10-11 LAB — COMPREHENSIVE METABOLIC PANEL
ALT: 17 U/L (ref 7–52)
AST: 22 U/L (ref 13–39)
Albumin: 4.2 g/dL (ref 3.5–5.7)
Alkaline Phosphatase: 74 U/L (ref 36–125)
Anion Gap: 7 mmol/L (ref 3–16)
BUN: 23 mg/dL (ref 7–25)
CO2: 30 mmol/L (ref 21–33)
Calcium: 9.4 mg/dL (ref 8.6–10.3)
Chloride: 101 mmol/L (ref 98–110)
Creatinine: 0.66 mg/dL (ref 0.60–1.30)
Glucose: 95 mg/dL (ref 70–100)
Osmolality, Calculated: 289 mOsm/kg (ref 278–305)
Potassium: 4.2 mmol/L (ref 3.5–5.3)
Sodium: 138 mmol/L (ref 133–146)
Total Bilirubin: 0.7 mg/dL (ref 0.0–1.5)
Total Protein: 6.4 g/dL (ref 6.4–8.9)
eGFR AA CKD-EPI: >90 See note.
eGFR NONAA CKD-EPI: 81 See note.

## 2016-10-11 LAB — CBC
Hematocrit: 40.3 % (ref 35.0–45.0)
Hemoglobin: 13.3 g/dL (ref 11.7–15.5)
MCH: 29.4 pg (ref 27.0–33.0)
MCHC: 33 g/dL (ref 32.0–36.0)
MCV: 89 fL (ref 80.0–100.0)
MPV: 10.6 fL (ref 7.5–11.5)
Platelets: 119 10*3/uL (ref 140–400)
RBC: 4.53 10*6/uL (ref 3.80–5.10)
RDW: 14.6 % (ref 11.0–15.0)
WBC: 3.2 10*3/uL (ref 3.8–10.8)

## 2016-10-11 LAB — IGA: IgA: 83.9 mg/dL (ref 82.0–453.0)

## 2016-10-11 LAB — IGM: IgM: 82 mg/dL (ref 46.0–304.0)

## 2016-10-11 LAB — KAPPA/LAMBDA, FREE LIGHT CHAINS, SERUM
Kappa/Lambda Ratio: 1.12 ratio (ref 0.26–1.65)
Kappa: 14 mg/L (ref 3.3–19.4)
Lambda: 12.5 mg/L (ref 5.7–26.3)

## 2016-10-11 LAB — IGG: IgG: 752 mg/dL (ref 751.0–1560.0)

## 2016-10-11 MED ORDER — hydrocortisone sod succ (PF)(SOLU-CORTEF) 100 mg injection
100 | Freq: Every day | INTRAMUSCULAR | Status: AC | PRN
Start: 2016-10-11 — End: 2016-10-11

## 2016-10-11 MED ORDER — heparin lock flush Syrg 500 Units
100 | Freq: Every day | INTRAVENOUS | Status: AC | PRN
Start: 2016-10-11 — End: 2016-10-11

## 2016-10-11 MED ORDER — acetaminophen (TYLENOL) tablet 650 mg
325 | Freq: Once | ORAL | Status: AC
Start: 2016-10-11 — End: 2016-10-11
  Administered 2016-10-11: 17:00:00 650 mg via ORAL

## 2016-10-11 MED ORDER — EPINEPHrine (ADRENALIN) 1 mg/mL injection 0.3 mg
1 | Freq: Every day | INTRAMUSCULAR | Status: AC | PRN
Start: 2016-10-11 — End: 2016-10-11

## 2016-10-11 MED ORDER — diphenhydrAMINE (BENADRYL) injection 25 mg
50 | Freq: Every day | INTRAMUSCULAR | Status: AC | PRN
Start: 2016-10-11 — End: 2016-10-11

## 2016-10-11 MED ORDER — immune globulin (human) (IgG) (GAMMAGARD) Soln 25 g
10 | Freq: Once | INTRAMUSCULAR | Status: AC
Start: 2016-10-11 — End: 2016-10-11
  Administered 2016-10-11: 18:00:00 25 g/kg via INTRAVENOUS

## 2016-10-11 MED ORDER — sodium chloride flush 10 mL
Freq: Every day | INTRAMUSCULAR | Status: AC | PRN
Start: 2016-10-11 — End: 2016-10-11

## 2016-10-11 MED ORDER — diphenhydrAMINE (BENADRYL) capsule 25 mg
25 | Freq: Once | ORAL | Status: AC
Start: 2016-10-11 — End: 2016-10-11
  Administered 2016-10-11: 17:00:00 25 mg via ORAL

## 2016-10-11 MED ORDER — sodium chloride 0.9 % infusion
Freq: Every day | INTRAVENOUS | Status: AC | PRN
Start: 2016-10-11 — End: 2016-10-11

## 2016-10-11 MED ORDER — albuterol (PROVENTIL;VENTOLIN;PROAIR) inhaler 1-2 puff
90 | RESPIRATORY_TRACT | Status: AC | PRN
Start: 2016-10-11 — End: 2016-10-11

## 2016-10-11 MED FILL — TYLENOL 325 MG TABLET: 325 325 mg | ORAL | Qty: 2

## 2016-10-11 MED FILL — GAMMAGARD LIQUID 10 % INJECTION SOLUTION: 10 10 % | INTRAMUSCULAR | Qty: 250

## 2016-10-11 MED FILL — DIPHENHYDRAMINE 25 MG CAPSULE: 25 25 mg | ORAL | Qty: 1

## 2016-10-11 NOTE — Progress Notes (Signed)
[name] arrives for ivig.  IV placed without difficulty.  Fall assessment completed, pt is low risk, oriented to room and call light given.See MD note for vitals, allergies, and medicationsPt tolerated treatment without difficulty, AVS given.  Pt left ambulatory.

## 2016-10-11 NOTE — Unmapped (Signed)
I, Dr. Dionisio Paschal Jemel Ono, saw Ashley Madden on 10/11/16.    The following is my dictated note detailing the patient's symptoms, examination, assessment, and plan that I performed during this visit.    DIAGNOSIS(ES):  Left-sided breast cancer T1N0M0, non-Hodgkin's lymphoma with recurrence, history of IgG deficiency.      [name] is here for continuation of IVIG.  She is doing pretty well today.  She has had several issues.  One is she keeps having recurrent skin cancers. When they get burned off, she states she has very prolonged healing.  I have told her we will check her immunoglobulin levels to make sure that she is receiving the correct amount and interval etc.  She has had no other intercurrent signs of infection.  She did have flu vaccine already.  I did tell her that she should definitely get the new shingles vaccine once available.  She is having some problems with breathlessness.  She was recently in [place]. She had a CAT scan to rule out a pulmonary embolus when she returned.  This was under the direction of her PCP.  This was negative, but it did show some mild cardiomegaly.  I have suggested to her that we will get an echo to be sure her cardiac function is stable.  She does know that she has had interstitial lung markings, which appeared to be related to radiation change.  They have been longstanding.  She reports no change in cough and she does use her Advair inhaler on a routine basis.  She has had no other new GI, GU, pulmonary, cardiac, neurologic, musculoskeletal, cutaneous, endocrine, psychiatric, hematologic problems.  Mammogram is due in October.    PHYSICAL EXAMINATION:  Vital signs as noted.  She is alert, oriented and otherwise in no acute distress.  She is by herself today.  Her throat is clear without ulceration or Candida.  Lungs are clear.  Breast exam was not performed.  Abdomen is soft without ascites, jaundice or organomegaly.  No Teckla Christiansen extremity edema.  Skin is without rash.  She  does have scattered areas of skin cancer on her arms, legs which have been treated.  There is no overt signs of cellulitis at this time.  Musculoskeletal- no change in arthritis, no overt fracture.  Neurologic- mental status alert and oriented.  Cranial nerves grossly intact.  Motor and sensory grossly intact.  Gait is negative.  She is not using an assist device.  Psychiatric- she is not overly anxious or depressed.      Laboratory tests all per flow sheet.    IMPRESSION:  Doing well, some breathlessness, questionable etiology.     PLAN:   1. This could be her underlying lung issues.  She does not think it is bad enough to get pulmonary function tests again.  I have cautioned her I think we should least get a cardiac echo which was ordered.   2. Bone health issues addressed, calcium, vitamin D, weightbearing exercise.    3. We will check immunoglobulin levels and continue IVIG today.    4. She will contact us for additional other problems.  Flu vaccine recommended as well as herpes zoster vaccine recommended.  She will return as directed and will contact us for additional problems.    5. Mammogram was also ordered for next month.    EEL/pkd  [id number]

## 2016-10-11 NOTE — Progress Notes (Signed)
See dictated note

## 2016-10-12 NOTE — Telephone Encounter (Signed)
Called patient to give IGm, IGG ect. Results. No answer. Left VM with contact info.

## 2016-10-12 NOTE — Unmapped (Signed)
-----   Message from Elyse Lower, MD sent at 10/12/2016  5:01 PM EDT -----      ----- Message -----  From: Interface, Lab In [facility]  Sent: 10/11/2016  11:52 AM  To: Elyse Lower, MD

## 2016-10-13 NOTE — Unmapped (Signed)
Patient notified of Lab results, ie. IGG, IGM, IGA. Patient voiced understanding.

## 2016-10-13 NOTE — Unmapped (Signed)
-----   Message from [name], MD sent at 10/12/2016  5:01 PM EDT -----      ----- Message -----  From: Leory Plowman, Lab In [place]  Sent: 10/11/2016  11:52 AM  To: [name], MD

## 2016-10-22 ENCOUNTER — Ambulatory Visit: Payer: MEDICARE

## 2016-10-26 ENCOUNTER — Inpatient Hospital Stay: Admit: 2016-10-26 | Payer: MEDICARE | Attending: Hematology & Oncology

## 2016-10-26 DIAGNOSIS — C50919 Malignant neoplasm of unspecified site of unspecified female breast: Secondary | ICD-10-CM

## 2016-10-26 DIAGNOSIS — Z1231 Encounter for screening mammogram for malignant neoplasm of breast: Secondary | ICD-10-CM

## 2016-10-27 NOTE — Unmapped (Signed)
-----   Message from [name], MD sent at 10/26/2016  8:53 PM EDT -----      ----- Message -----  From: Domingo Madeira  Sent: 10/26/2016   5:08 PM  To: [name], MD

## 2016-10-27 NOTE — Telephone Encounter (Signed)
Patient notified of Echo results. Patient voiced understanding.

## 2016-11-08 ENCOUNTER — Other Ambulatory Visit: Admit: 2016-11-08 | Payer: MEDICARE

## 2016-11-08 ENCOUNTER — Ambulatory Visit: Admit: 2016-11-08 | Payer: MEDICARE

## 2016-11-08 ENCOUNTER — Ambulatory Visit: Admit: 2016-11-08 | Payer: MEDICARE | Attending: Hematology & Oncology

## 2016-11-08 DIAGNOSIS — D803 Selective deficiency of immunoglobulin G [IgG] subclasses: Secondary | ICD-10-CM

## 2016-11-08 DIAGNOSIS — C50919 Malignant neoplasm of unspecified site of unspecified female breast: Secondary | ICD-10-CM

## 2016-11-08 LAB — KAPPA/LAMBDA, FREE LIGHT CHAINS, SERUM
Kappa/Lambda Ratio: 1.1 ratio (ref 0.26–1.65)
Kappa: 13.2 mg/L (ref 3.3–19.4)
Lambda: 12 mg/L (ref 5.7–26.3)

## 2016-11-08 LAB — DIFFERENTIAL
Basophils Absolute: 16 /uL (ref 0–200)
Basophils Relative: 0.4 % (ref 0.0–1.0)
Eosinophils Absolute: 78 /uL (ref 15–500)
Eosinophils Relative: 1.9 % (ref 0.0–8.0)
Lymphocytes Absolute: 566 /uL (ref 850–3900)
Lymphocytes Relative: 13.8 % (ref 15.0–45.0)
Monocytes Absolute: 636 /uL (ref 200–950)
Monocytes Relative: 15.5 % (ref 0.0–12.0)
Neutrophils Absolute: 2804 /uL (ref 1500–7800)
Neutrophils Relative: 68.4 % (ref 40.0–80.0)
nRBC: 0 /100 WBC (ref 0–0)

## 2016-11-08 LAB — COMPREHENSIVE METABOLIC PANEL
ALT: 43 U/L (ref 7–52)
AST: 65 U/L (ref 13–39)
Albumin: 4.3 g/dL (ref 3.5–5.7)
Alkaline Phosphatase: 62 U/L (ref 36–125)
Anion Gap: 9 mmol/L (ref 3–16)
BUN: 18 mg/dL (ref 7–25)
CO2: 25 mmol/L (ref 21–33)
Calcium: 9.3 mg/dL (ref 8.6–10.3)
Chloride: 104 mmol/L (ref 98–110)
Creatinine: 0.6 mg/dL (ref 0.60–1.30)
Glucose: 85 mg/dL (ref 70–100)
Osmolality, Calculated: 287 mOsm/kg (ref 278–305)
Potassium: 6.2 mmol/L (ref 3.5–5.3)
Sodium: 138 mmol/L (ref 133–146)
Total Bilirubin: 0.7 mg/dL (ref 0.0–1.5)
Total Protein: 6.9 g/dL (ref 6.4–8.9)
eGFR AA CKD-EPI: >90 See note.
eGFR NONAA CKD-EPI: 84 See note.

## 2016-11-08 LAB — IGG: IgG: 920 mg/dL (ref 751.0–1560.0)

## 2016-11-08 LAB — CBC
Hematocrit: 38.9 % (ref 35.0–45.0)
Hemoglobin: 13 g/dL (ref 11.7–15.5)
MCH: 29.3 pg (ref 27.0–33.0)
MCHC: 33.5 g/dL (ref 32.0–36.0)
MCV: 87.6 fL (ref 80.0–100.0)
MPV: 10.1 fL (ref 7.5–11.5)
Platelets: 123 10*3/uL (ref 140–400)
RBC: 4.44 10*6/uL (ref 3.80–5.10)
RDW: 14.6 % (ref 11.0–15.0)
WBC: 4.1 10*3/uL (ref 3.8–10.8)

## 2016-11-08 LAB — IGA: IgA: 76.3 mg/dL (ref 82.0–453.0)

## 2016-11-08 LAB — IGM: IgM: 114 mg/dL (ref 46.0–304.0)

## 2016-11-08 MED ORDER — diphenhydrAMINE (BENADRYL) capsule 25 mg
25 | Freq: Once | ORAL | Status: AC
Start: 2016-11-08 — End: 2016-11-08
  Administered 2016-11-08: 17:00:00 25 mg via ORAL

## 2016-11-08 MED ORDER — sodium chloride flush 10 mL
Freq: Every day | INTRAMUSCULAR | Status: AC | PRN
Start: 2016-11-08 — End: 2016-11-08

## 2016-11-08 MED ORDER — heparin lock flush Syrg 500 Units
100 | Freq: Every day | INTRAVENOUS | Status: AC | PRN
Start: 2016-11-08 — End: 2016-11-08

## 2016-11-08 MED ORDER — sodium chloride 0.9 % infusion
Freq: Every day | INTRAVENOUS | Status: AC | PRN
Start: 2016-11-08 — End: 2016-11-08

## 2016-11-08 MED ORDER — EPINEPHrine (ADRENALIN) 1 mg/mL injection 0.3 mg
1 | Freq: Every day | INTRAMUSCULAR | Status: AC | PRN
Start: 2016-11-08 — End: 2016-11-08

## 2016-11-08 MED ORDER — immune globulin (human) (IgG) (GAMMAGARD) Soln 25 g
10 | Freq: Once | INTRAMUSCULAR | Status: AC
Start: 2016-11-08 — End: 2016-11-08
  Administered 2016-11-08: 18:00:00 25 g/kg via INTRAVENOUS

## 2016-11-08 MED ORDER — acetaminophen (TYLENOL) tablet 650 mg
325 | Freq: Once | ORAL | Status: AC
Start: 2016-11-08 — End: 2016-11-08
  Administered 2016-11-08: 17:00:00 650 mg via ORAL

## 2016-11-08 MED ORDER — diphenhydrAMINE (BENADRYL) injection 25 mg
50 | Freq: Every day | INTRAMUSCULAR | Status: AC | PRN
Start: 2016-11-08 — End: 2016-11-08

## 2016-11-08 MED ORDER — albuterol (PROVENTIL;VENTOLIN;PROAIR) inhaler 1-2 puff
90 | RESPIRATORY_TRACT | Status: AC | PRN
Start: 2016-11-08 — End: 2016-11-08

## 2016-11-08 MED ORDER — hydrocortisone sod succ (PF)(SOLU-CORTEF) 100 mg injection
100 | Freq: Every day | INTRAMUSCULAR | Status: AC | PRN
Start: 2016-11-08 — End: 2016-11-08

## 2016-11-08 MED FILL — DIPHENHYDRAMINE 25 MG CAPSULE: 25 25 mg | ORAL | Qty: 1

## 2016-11-08 MED FILL — TYLENOL 325 MG TABLET: 325 325 mg | ORAL | Qty: 2

## 2016-11-08 MED FILL — GAMMAGARD LIQUID 10 % INJECTION SOLUTION: 10 10 % | INTRAMUSCULAR | Qty: 250

## 2016-11-08 NOTE — Unmapped (Signed)
See dictated note

## 2016-11-08 NOTE — Unmapped (Signed)
I, Dr. Dionisio Paschal Drenda Sobecki, saw Ashley Madden on 11/08/2016.    The following is my dictated note detailing the patient's symptoms, examination, assessment and plan that I performed during this visit.      DIAGNOSIS(ES):    1. Breast cancer T1, N0, M0, ER PR negative.    2. History of non-Hodgkin's lymphoma.    3. History of IgG deficiency.      Ashley Madden is here for continuation of IVIG.  Overall she is doing very well.  She is on her way to [place] this week.  She will be gone for about a 48-month time period.  She does have oncology care in the [place] area.  She has not had her shingles vaccine.  She will probably get that in [place].  She is aware that in our area there is a Scientist, clinical (histocompatibility and immunogenetics).  She recently had mammogram, which was unremarkable.  She is up-to-date with DEXA scan, which was done in October.  I did tell her I think she should probably have a repeat chest CT.  Occasionally she has cough and breathlessness.  We recently did an echo, which was negative, and I have ordered the CAT scan for full evaluation.  She did have non-Hodgkin's lymphoma in the past and she has no clinical symptoms at this time except for some breathlessness.  She is aware that if things worsen that she may have to have more diagnostic testing when she is in [place].  She has had no leg swelling.  She does know to get up, walk around.  She is not hypoxic and vital signs are otherwise stable.  She is otherwise feeling fairly well.  She had some recent skin cancers removed.  We did discuss moisturizers.  She does wear sunscreen.  She tries to avoid the sun, but does walk about 2 miles every day when she is in [place].  No other new GI, GU, pulmonary, cardiac, neurologic, musculoskeletal, cutaneous, endocrine, psychiatric, hematologic, infectious disease or other major difficulties.    PHYSICAL EXAMINATION:  Vital signs per flow sheet.  She is alert, oriented and otherwise in no acute distress.  Throat is clear without ulceration or  Candida.  Lungs are clear.  Cardiac exam- No murmur, rub or gallop.  No cervical, supra, infraclavicular adenopathy.  Abdomen is soft.  No ascites, jaundice or organomegaly.  No Cedarius Kersh extremity edema.  She is not clubbed.  Skin is without rash.  She does have the area over her left forearm where she recently had a cancer removed.  This looks like it is doing fine.  She does have a lot of dry skin and a lot of keratoses.  No active rash.  No ecchymoses.  Musculoskeletal- No change in arthritis, no overt fracture.  Neurologic- Mental status, alert and oriented.  Cranial nerves unchanged.  Motor and sensory unchanged.  Gait is unremarkable.  She is not using an assist device.  Psychiatric- She is not overly anxious or depressed.      Laboratory tests all per flow sheet.    IMPRESSION:  Doing much better.    PLAN:    1. Patient will continue current IVIG.  2. She knows she should get shingles vaccine.  3. I have ordered a chest CT for completeness sake.  She does know if she develops additional symptoms she may need more evaluation.    4. Copies of blood work given and reviewed.  5. She will contact medical records to get the remainder of her records  sent back to [place].  I suggested to her that she contact her doctors in [place] to see if they use the Epic system, which will obviously make that much easier.    6. She will contact us for other problems and return as directed.    EEL/nwc   [id number]

## 2016-11-08 NOTE — Unmapped (Signed)
Ashley Madden arrives for ivig after md visit.  Fall assessment completed, pt is low risk, oriented to room and call light given.Labs reviewed, denies any new issues. See MD note for vitals, allergies, and medications.  Pt tolerated treatment without difficulty, pt declined AVS .  Pt left ambulatory.  Marland Kitchen

## 2017-03-04 MED ORDER — ADVAIR DISKUS 250-50 mcg/dose diskus inhaler
250-50 | RESPIRATORY_TRACT | 5 refills | Status: AC
Start: 2017-03-04 — End: 2019-10-16

## 2017-03-07 MED ORDER — atenolol (TENORMIN) 50 MG tablet
50 | ORAL_TABLET | Freq: Every day | ORAL | 0 refills | Status: AC
Start: 2017-03-07 — End: 2017-08-16

## 2017-03-07 NOTE — Unmapped (Signed)
Please ask patient to discuss getting this through her PCP. He should manage this even though we ordered originally. He manages other cardiac meds.  I will fill for 3 months to get her by.  Thanks

## 2017-06-20 ENCOUNTER — Ambulatory Visit: Admit: 2017-06-20 | Payer: MEDICARE

## 2017-06-20 ENCOUNTER — Ambulatory Visit: Admit: 2017-06-20 | Payer: MEDICARE | Attending: Hematology & Oncology

## 2017-06-20 ENCOUNTER — Other Ambulatory Visit: Admit: 2017-06-20 | Payer: MEDICARE

## 2017-06-20 ENCOUNTER — Inpatient Hospital Stay: Admit: 2017-06-20 | Discharge: 2017-06-25 | Payer: MEDICARE | Attending: Hematology & Oncology

## 2017-06-20 ENCOUNTER — Inpatient Hospital Stay: Admit: 2017-06-20 | Payer: MEDICARE | Attending: Hematology & Oncology

## 2017-06-20 DIAGNOSIS — D803 Selective deficiency of immunoglobulin G [IgG] subclasses: Secondary | ICD-10-CM

## 2017-06-20 DIAGNOSIS — C50912 Malignant neoplasm of unspecified site of left female breast: Secondary | ICD-10-CM

## 2017-06-20 DIAGNOSIS — Z5111 Encounter for antineoplastic chemotherapy: Secondary | ICD-10-CM

## 2017-06-20 DIAGNOSIS — C50919 Malignant neoplasm of unspecified site of unspecified female breast: Secondary | ICD-10-CM

## 2017-06-20 LAB — COMPREHENSIVE METABOLIC PANEL
ALT: 20 U/L (ref 7–52)
AST: 20 U/L (ref 13–39)
Albumin: 4.1 g/dL (ref 3.5–5.7)
Alkaline Phosphatase: 66 U/L (ref 36–125)
Anion Gap: 10 mmol/L (ref 3–16)
BUN: 18 mg/dL (ref 7–25)
CO2: 27 mmol/L (ref 21–33)
Calcium: 9.5 mg/dL (ref 8.6–10.3)
Chloride: 104 mmol/L (ref 98–110)
Creatinine: 0.66 mg/dL (ref 0.60–1.30)
Glucose: 70 mg/dL (ref 70–100)
Osmolality, Calculated: 292 mOsm/kg (ref 278–305)
Potassium: 3.9 mmol/L (ref 3.5–5.3)
Sodium: 141 mmol/L (ref 133–146)
Total Bilirubin: 0.8 mg/dL (ref 0.0–1.5)
Total Protein: 6.3 g/dL (ref 6.4–8.9)
eGFR AA CKD-EPI: >90 See note.
eGFR NONAA CKD-EPI: 81 See note.

## 2017-06-20 LAB — CBC
Hematocrit: 38.9 % (ref 35.0–45.0)
Hemoglobin: 13.2 g/dL (ref 11.7–15.5)
MCH: 29.8 pg (ref 27.0–33.0)
MCHC: 33.9 g/dL (ref 32.0–36.0)
MCV: 87.9 fL (ref 80.0–100.0)
MPV: 9.9 fL (ref 7.5–11.5)
Platelets: 118 10*3/uL (ref 140–400)
RBC: 4.42 10*6/uL (ref 3.80–5.10)
RDW: 14.8 % (ref 11.0–15.0)
WBC: 3.8 10*3/uL (ref 3.8–10.8)

## 2017-06-20 LAB — LACTATE DEHYDROGENASE: LD: 188 U/L (ref 110–270)

## 2017-06-20 MED ORDER — acetaminophen (TYLENOL) tablet 650 mg
325 | Freq: Once | ORAL | Status: AC
Start: 2017-06-20 — End: 2017-06-20
  Administered 2017-06-20: 17:00:00 650 mg via ORAL

## 2017-06-20 MED ORDER — albuterol (PROVENTIL;VENTOLIN;PROAIR) inhaler 1-2 puff
90 | RESPIRATORY_TRACT | Status: AC | PRN
Start: 2017-06-20 — End: 2017-06-20

## 2017-06-20 MED ORDER — EPINEPHrine (ADRENALIN) 1 mg/mL injection 0.3 mg
1 | Freq: Every day | INTRAMUSCULAR | Status: AC | PRN
Start: 2017-06-20 — End: 2017-06-20

## 2017-06-20 MED ORDER — diphenhydrAMINE (BENADRYL) capsule 25 mg
25 | Freq: Once | ORAL | Status: AC
Start: 2017-06-20 — End: 2017-06-20
  Administered 2017-06-20: 17:00:00 25 mg via ORAL

## 2017-06-20 MED ORDER — sodium chloride 0.9 % infusion
Freq: Every day | INTRAVENOUS | Status: AC | PRN
Start: 2017-06-20 — End: 2017-06-20
  Administered 2017-06-20: 17:00:00 25 mL/h via INTRAVENOUS

## 2017-06-20 MED ORDER — immune globulin (human) (IgG) (GAMMAGARD) Soln 25 g
10 | Freq: Once | INTRAMUSCULAR | Status: AC
Start: 2017-06-20 — End: 2017-06-20
  Administered 2017-06-20: 18:00:00 25 g/kg via INTRAVENOUS

## 2017-06-20 MED ORDER — hydrocortisone sod succ (PF)(SOLU-CORTEF) 100 mg injection
100 | Freq: Every day | INTRAMUSCULAR | Status: AC | PRN
Start: 2017-06-20 — End: 2017-06-20

## 2017-06-20 MED ORDER — sodium chloride flush 10 mL
Freq: Every day | INTRAMUSCULAR | Status: AC | PRN
Start: 2017-06-20 — End: 2017-06-20

## 2017-06-20 MED ORDER — heparin lock flush Syrg 500 Units
100 | Freq: Every day | INTRAVENOUS | Status: AC | PRN
Start: 2017-06-20 — End: 2017-06-20

## 2017-06-20 MED ORDER — diphenhydrAMINE (BENADRYL) injection 25 mg
50 | Freq: Every day | INTRAMUSCULAR | Status: AC | PRN
Start: 2017-06-20 — End: 2017-06-20

## 2017-06-20 MED FILL — GAMMAGARD LIQUID 10 % INJECTION SOLUTION: 10 10 % | INTRAMUSCULAR | Qty: 250

## 2017-06-20 MED FILL — DIPHENHYDRAMINE 25 MG CAPSULE: 25 25 mg | ORAL | Qty: 1

## 2017-06-20 MED FILL — TYLENOL 325 MG TABLET: 325 325 mg | ORAL | Qty: 2

## 2017-06-20 NOTE — Unmapped (Signed)
-----   Message from [name], MD sent at 06/20/2017  1:38 PM EDT -----  notify  ----- Message -----  From: Interface, Results In  Sent: 06/20/2017   1:06 PM  To: [name], MD

## 2017-06-20 NOTE — Nursing Note (Signed)
[name] here today after MD appointment for IVIG infusion. #24 PIV inserted in RFA. Pt not a fall risk on assessment. See MD visit note for Vitals, Allergy and Medication review.

## 2017-06-20 NOTE — Progress Notes (Signed)
See dictated note

## 2017-06-20 NOTE — Unmapped (Signed)
Addended by: [name] E on: 06/20/2017 12:32 PM     Modules accepted: Orders

## 2017-06-20 NOTE — Unmapped (Signed)
Patient notified of Xray results. Patient voiced understanding.

## 2017-06-20 NOTE — Unmapped (Signed)
I, Dr. Dionisio Paschal Hau Sanor, saw Ashley Madden on 06/20/17.    The following is my dictated note detailing the patient's symptoms, examination, assessment, and plan that I performed during this visit.    DIAGNOSIS(ES):  Breast cancer T1N0M0, ER PR, HER-2 neu negative, non-Hodgkin's lymphoma with recurrence, IgG deficiency.      Ashley Madden is here for continuation of IVIG management, etc.  She has had a very stormy couple months.  Unfortunately her grandson who was age 35 living independently in [place] was recently hit while he was riding his motorcycle.  He unfortunately became into a vegetative state and ended up dying.  She states this has been very tough on her family.  They are adjusting the best they can to it.  They have lots of resources lots of faith and seemingly will be doing okay, although she realizes it will take time.  She is otherwise doing pretty well.  She recently fell.  She injured her shoulder.  This was about 4 or 5 weeks ago.  She thinks it is better but it is still not back to normal.  I have told her we get an x-ray to make sure she does not have a hairline fracture.  She will be due for repeat bone density the end of the year.  She also be due for mammogram in October.  No other new GI, GU, pulmonary, cardiac, neurologic, musculoskeletal, cutaneous, endocrine, psychiatric, hematologic, infectious disease or other major difficulties.      Remainder of a 10-point review of systems is unchanged.    PHYSICAL EXAMINATION:  Vital signs as noted.  She is alert and oriented and otherwise in no acute distress.  Throat is clear without ulceration or Candida.  Lungs are clear.  Cardiac exam- no murmur, rub or gallop.  No cervical, supra, infraclavicular adenopathy.  Chest wall is unchanged.  Abdomen is soft.  No ascites, jaundice or organomegaly.  No Valaria Kohut extremity edema.  She is not clubbed.  Skin is without rash.  No ecchymoses.  Musculoskeletal- no change in arthritis, no overt fracture.  Neurologic-  mental status alert and oriented.  Cranial nerves unchanged.  Motor and sensory unchanged.  Gait is negative.  She is not using an assist device.  Psychiatric- she is not overly anxious or depressed.      Laboratory tests all per flow sheet.    IMPRESSION:  Doing well.    PLAN:    1. Patient will continue current observation, IVIG.   2. We discussed the grieving process.  She is aware of other intervention.   3. Copies of blood work given and reviewed. LDH is normal.      4. DEXA scan will be due the end of the year as will mammogram.   5. She will return as directed.     EEL/pkd  [id number]

## 2017-06-22 NOTE — Telephone Encounter (Signed)
Patient notified of Chest CTresults. Patient voiced understanding.

## 2017-06-22 NOTE — Unmapped (Signed)
-----   Message from [name], MD sent at 06/20/2017  8:21 PM EDT -----  notify  ----- Message -----  From: Interface, Results In  Sent: 06/20/2017   2:38 PM  To: [name], MD

## 2017-08-01 ENCOUNTER — Ambulatory Visit: Payer: MEDICARE

## 2017-08-01 ENCOUNTER — Ambulatory Visit: Payer: MEDICARE | Attending: Hematology & Oncology

## 2017-08-16 ENCOUNTER — Ambulatory Visit: Admit: 2017-08-16 | Payer: MEDICARE

## 2017-08-16 ENCOUNTER — Inpatient Hospital Stay: Admit: 2017-08-16 | Payer: MEDICARE | Attending: Hematology & Oncology

## 2017-08-16 ENCOUNTER — Ambulatory Visit: Admit: 2017-08-16 | Payer: MEDICARE | Attending: Hematology & Oncology

## 2017-08-16 DIAGNOSIS — D803 Selective deficiency of immunoglobulin G [IgG] subclasses: Secondary | ICD-10-CM

## 2017-08-16 DIAGNOSIS — C50919 Malignant neoplasm of unspecified site of unspecified female breast: Secondary | ICD-10-CM

## 2017-08-16 DIAGNOSIS — C50912 Malignant neoplasm of unspecified site of left female breast: Secondary | ICD-10-CM

## 2017-08-16 LAB — CBC
Hematocrit: 37 % (ref 35.0–45.0)
Hemoglobin: 12.7 g/dL (ref 11.7–15.5)
MCH: 30.5 pg (ref 27.0–33.0)
MCHC: 34.3 g/dL (ref 32.0–36.0)
MCV: 89 fL (ref 80.0–100.0)
MPV: 10.1 fL (ref 7.5–11.5)
Platelets: 107 10*3/uL (ref 140–400)
RBC: 4.16 10*6/uL (ref 3.80–5.10)
RDW: 15.8 % (ref 11.0–15.0)
WBC: 3.8 10*3/uL (ref 3.8–10.8)

## 2017-08-16 LAB — COMPREHENSIVE METABOLIC PANEL
ALT: 18 U/L (ref 7–52)
AST: 22 U/L (ref 13–39)
Albumin: 4.3 g/dL (ref 3.5–5.7)
Alkaline Phosphatase: 72 U/L (ref 36–125)
Anion Gap: 7 mmol/L (ref 3–16)
BUN: 24 mg/dL (ref 7–25)
CO2: 29 mmol/L (ref 21–33)
Calcium: 9.6 mg/dL (ref 8.6–10.3)
Chloride: 104 mmol/L (ref 98–110)
Creatinine: 0.61 mg/dL (ref 0.60–1.30)
Glucose: 123 mg/dL (ref 70–100)
Osmolality, Calculated: 295 mOsm/kg (ref 278–305)
Potassium: 4 mmol/L (ref 3.5–5.3)
Sodium: 140 mmol/L (ref 133–146)
Total Bilirubin: 0.7 mg/dL (ref 0.0–1.5)
Total Protein: 6.5 g/dL (ref 6.4–8.9)
eGFR AA CKD-EPI: >90 See note.
eGFR NONAA CKD-EPI: 83 See note.

## 2017-08-16 LAB — DIFFERENTIAL
Basophils Absolute: 8 /uL (ref 0–200)
Basophils Relative: 0.2 % (ref 0.0–1.0)
Eosinophils Absolute: 34 /uL (ref 15–500)
Eosinophils Relative: 0.9 % (ref 0.0–8.0)
Lymphocytes Absolute: 334 /uL (ref 850–3900)
Lymphocytes Relative: 8.8 % (ref 15.0–45.0)
Monocytes Absolute: 513 /uL (ref 200–950)
Monocytes Relative: 13.5 % (ref 0.0–12.0)
Neutrophils Absolute: 2911 /uL (ref 1500–7800)
Neutrophils Relative: 76.6 % (ref 40.0–80.0)
nRBC: 0 /100 WBC (ref 0–0)

## 2017-08-16 MED ORDER — sodium chloride flush 10 mL
Freq: Every day | INTRAMUSCULAR | Status: AC | PRN
Start: 2017-08-16 — End: 2017-08-16

## 2017-08-16 MED ORDER — hydrocortisone sod succ (PF)(SOLU-CORTEF) 100 mg injection
100 | Freq: Every day | INTRAMUSCULAR | Status: AC | PRN
Start: 2017-08-16 — End: 2017-08-16

## 2017-08-16 MED ORDER — albuterol (PROVENTIL;VENTOLIN;PROAIR) inhaler 1-2 puff
90 | RESPIRATORY_TRACT | Status: AC | PRN
Start: 2017-08-16 — End: 2017-08-16

## 2017-08-16 MED ORDER — EPINEPHrine (ADRENALIN) 1 mg/mL injection 0.3 mg
1 | Freq: Every day | INTRAMUSCULAR | Status: AC | PRN
Start: 2017-08-16 — End: 2017-08-16

## 2017-08-16 MED ORDER — heparin lock flush Syrg 500 Units
100 | Freq: Every day | INTRAVENOUS | Status: AC | PRN
Start: 2017-08-16 — End: 2017-08-16

## 2017-08-16 MED ORDER — diphenhydrAMINE (BENADRYL) injection 25 mg
50 | Freq: Every day | INTRAMUSCULAR | Status: AC | PRN
Start: 2017-08-16 — End: 2017-08-16

## 2017-08-16 MED ORDER — immune globulin (human) (IgG) (GAMMAGARD) Soln 25 g
10 | Freq: Once | INTRAMUSCULAR | Status: AC
Start: 2017-08-16 — End: 2017-08-16

## 2017-08-16 MED ORDER — sodium chloride 0.9 % infusion
Freq: Every day | INTRAVENOUS | Status: AC | PRN
Start: 2017-08-16 — End: 2017-08-16

## 2017-08-16 MED ORDER — acetaminophen (TYLENOL) tablet 650 mg
325 | Freq: Once | ORAL | Status: AC
Start: 2017-08-16 — End: 2017-08-16

## 2017-08-16 MED ORDER — atenolol (TENORMIN) 50 MG tablet
50 | ORAL_TABLET | Freq: Every day | ORAL | 0 refills | Status: AC
Start: 2017-08-16 — End: ?

## 2017-08-16 MED ORDER — diphenhydrAMINE (BENADRYL) capsule 25 mg
25 | Freq: Once | ORAL | Status: AC
Start: 2017-08-16 — End: 2017-08-16

## 2017-08-16 MED FILL — GAMMAGARD LIQUID 10 % INJECTION SOLUTION: 10 10 % | INTRAMUSCULAR | Qty: 250

## 2017-08-16 NOTE — Progress Notes (Signed)
See dictated note

## 2017-08-16 NOTE — Unmapped (Signed)
I, Dr. Dionisio Paschal Satya Bohall, saw Ashley Madden on 08/16/17.    The following is my dictated note detailing the patient's symptoms, examination, assessment, and plan that I performed during this visit.    DIAGNOSIS(ES):  Left-sided breast cancer T1N0M0, non-Hodgkin's lymphoma, history of immunoglobulin deficiency.      Ashley Madden is here for follow-up.  Overall she seems to be doing pretty well today.  She is spending most of her time actually in [place] these days.  She unfortunately recently fell yesterday and it was the middle of the night. She just lost her balance and she landed on her left shoulder.  Her shoulder and humerus hurt.  She can barely move them.  She has taken nothing for pain.  It is not clinically displaced but she does need x-rays.  She was having contralateral shoulder difficulty at her last visit but this is pretty much resolved.  She has had no other major intercurrent infections.  She still has a lot of issues with her stomach for which she is on Bentyl.  She will be due both for mammogram and DEXA scan this fall.  No other new GI, GU, pulmonary, cardiac, neurologic, musculoskeletal, cutaneous, endocrine, psychiatric, hematologic, infectious disease or other major difficulties.  She just got back from a vacation trip on two house boats with her extended family.    PHYSICAL EXAMINATION:  Vital signs as noted.  She is alert and oriented and otherwise in no acute distress.  Throat is clear without ulceration or Candida.  Lungs are clear.  Cardiac exam- no murmur, rub or gallop.  No cervical, supra, infraclavicular adenopathy.  Breast exam was not performed.  Family remained in the room.  Abdomen is soft.  No ascites, jaundice or organomegaly.  There is no Ashley Madden extremity edema.  She is not clubbed.  Skin shows scattered ecchymoses, no hematoma.  No chronic rash.  She does have a lot of solar damage.  Musculoskeletal- no change in distal extremity arthritis.  She does have impaired mobility of her left  shoulder due to pain.  It does not appear to be dislocated.  No other obvious fracture.  Neurologic, mental status alert and oriented.  Cranial nerves unchanged.  Motor and sensory is unchanged. Gait, she is not using an assist device that but does require some assistance.  Psychiatric- she is not overly anxious or depressed.      Laboratory tests as noted.    IMPRESSION:  Left shoulder and humerus pain, secondary to fall.      PLAN:   1. X-rays will be obtained.   2. She is due for IVIG.  I did tell her that I think at this time point we will try to get it if possible but she is aware of the nationwide shortage.  I also suggested to her that we try to decrease the frequency of IVIG and she is willing to try to do so.   3. We did discuss general health issues.  Mammogram is ordered.  DEXA scan is ordered.  Vitamin D level will be checked.  4. She will be notified regarding labs once they are available.    5. Questions of hers, her husband and her daughter who accompany her have been addressed.     EEL/pkd  [id number]

## 2017-08-16 NOTE — Unmapped (Signed)
Patient and daughter notified of Right Xray results. Patient voiced understanding.

## 2017-08-16 NOTE — Unmapped (Signed)
-----   Message from [name], MD sent at 08/16/2017  5:09 PM EDT -----  notify  ----- Message -----  From: Interface, Results In  Sent: 08/16/2017   3:36 PM  To: [name], MD

## 2017-08-16 NOTE — Unmapped (Addendum)
Pt arrived ambulatory for IVIG.  Pharmacy stated, prior to patient's return from OV with Dr. Samara Snide and Jillyn Hidden, that patient's disease classification was not approved for IVIG administration.  (Current Shortage)  IV started.  Fall risk score 45.  Immediately following IV access, a message was received from pharmacy stating that patient was not approved.  IV was removed and pt left department ambulatory.

## 2017-10-20 NOTE — Telephone Encounter (Signed)
October 20, 2017 12:55 PM      Ashley Madden has called to advise of the following:     She has been advised in the past the IVIG infusion has been unavailable. She is calling today to inquire if the infusion is now available, and if so, she would like to schedule an infusion appointment.     She states she will be leaving for out of the country on 11/08/2017 and would like to receive infusion before that time.    Please contact the patient at # [phone] (mobile)

## 2017-10-24 NOTE — Telephone Encounter (Signed)
Pt calling very upset that they have not received a call back in regards to previous message. Transferred call to RN Ferd Hibbs

## 2017-10-24 NOTE — Telephone Encounter (Addendum)
Pt called concerning IVIG.  Pt stated she left a message on Thursday Oct 3 and no one responded.  RN spoke with pt about IVIG schedule and would have to get in touch with pharmacy.  RN discussed with Carmine Savoy, NP.  RN called pt [phone] 2:45 to let her know Lafonda Mosses has placed a call to find out eligibility and is waiting for return phone call.  Rn explained to pt that she is off tomorrow but would follow up with her on Wednesday, 10/26/17.

## 2017-10-27 NOTE — Telephone Encounter (Signed)
Pt is approved for her IVIG.  RN tried calling pt at home and cell to schedule an appointment.  No answer, Left message.

## 2017-11-02 ENCOUNTER — Ambulatory Visit: Admit: 2017-11-02 | Payer: MEDICARE

## 2017-11-02 ENCOUNTER — Other Ambulatory Visit: Admit: 2017-11-02 | Payer: MEDICARE

## 2017-11-02 DIAGNOSIS — C50919 Malignant neoplasm of unspecified site of unspecified female breast: Secondary | ICD-10-CM

## 2017-11-02 DIAGNOSIS — D803 Selective deficiency of immunoglobulin G [IgG] subclasses: Secondary | ICD-10-CM

## 2017-11-02 LAB — CBC
Hematocrit: 39.2 % (ref 35.0–45.0)
Hemoglobin: 13.3 g/dL (ref 11.7–15.5)
MCH: 30 pg (ref 27.0–33.0)
MCHC: 34 g/dL (ref 32.0–36.0)
MCV: 88.3 fL (ref 80.0–100.0)
MPV: 10.9 fL (ref 7.5–11.5)
Platelets: 130 10*3/uL (ref 140–400)
RBC: 4.44 10*6/uL (ref 3.80–5.10)
RDW: 15 % (ref 11.0–15.0)
WBC: 3.4 10*3/uL (ref 3.8–10.8)

## 2017-11-02 LAB — DIFFERENTIAL
Basophils Absolute: 7 /uL (ref 0–200)
Basophils Relative: 0.2 % (ref 0.0–1.0)
Eosinophils Absolute: 51 /uL (ref 15–500)
Eosinophils Relative: 1.5 % (ref 0.0–8.0)
Lymphocytes Absolute: 530 /uL — ABNORMAL LOW (ref 850–3900)
Lymphocytes Relative: 15.6 % (ref 15.0–45.0)
Monocytes Absolute: 561 /uL (ref 200–950)
Monocytes Relative: 16.5 % — ABNORMAL HIGH (ref 0.0–12.0)
Neutrophils Absolute: 2251 /uL (ref 1500–7800)
Neutrophils Relative: 66.2 % (ref 40.0–80.0)
nRBC: 0 /100{WBCs} (ref 0–0)

## 2017-11-02 LAB — COMPREHENSIVE METABOLIC PANEL
ALT: 22 U/L (ref 7–52)
AST: 25 U/L (ref 13–39)
Albumin: 4.2 g/dL (ref 3.5–5.7)
Alkaline Phosphatase: 64 U/L (ref 36–125)
Anion Gap: 8 mmol/L (ref 3–16)
BUN: 21 mg/dL (ref 7–25)
CO2: 29 mmol/L (ref 21–33)
Calcium: 9.2 mg/dL (ref 8.6–10.3)
Chloride: 101 mmol/L (ref 98–110)
Creatinine: 0.77 mg/dL (ref 0.60–1.30)
Glucose: 105 mg/dL (ref 70–100)
Osmolality, Calculated: 289 mOsm/kg (ref 278–305)
Potassium: 3.6 mmol/L (ref 3.5–5.3)
Sodium: 138 mmol/L (ref 133–146)
Total Bilirubin: 0.9 mg/dL (ref 0.0–1.5)
Total Protein: 6.8 g/dL (ref 6.4–8.9)
eGFR AA CKD-EPI: 82 See note.
eGFR NONAA CKD-EPI: 70 See note.

## 2017-11-02 MED ORDER — heparin lock flush 500 Units
100 | Freq: Every day | INTRAVENOUS | Status: AC | PRN
Start: 2017-11-02 — End: 2017-11-02

## 2017-11-02 MED ORDER — EPINEPHrine (ADRENALIN) 1 mg/mL injection 0.3 mg
1 | Freq: Every day | INTRAMUSCULAR | Status: AC | PRN
Start: 2017-11-02 — End: 2017-11-02

## 2017-11-02 MED ORDER — hydrocortisone sod succ (PF)(SOLU-CORTEF) 100 mg injection
100 | Freq: Every day | INTRAMUSCULAR | Status: AC | PRN
Start: 2017-11-02 — End: 2017-11-02

## 2017-11-02 MED ORDER — diphenhydrAMINE (BENADRYL) injection 25 mg
50 | Freq: Every day | INTRAMUSCULAR | Status: AC | PRN
Start: 2017-11-02 — End: 2017-11-02

## 2017-11-02 MED ORDER — immune globulin (human) (IgG) (GAMMAGARD) 25 g
10 | Freq: Once | INTRAMUSCULAR | Status: AC
Start: 2017-11-02 — End: 2017-11-02
  Administered 2017-11-02: 18:00:00 25 g/kg via INTRAVENOUS

## 2017-11-02 MED ORDER — albuterol (PROVENTIL;VENTOLIN;PROAIR) inhaler 1-2 puff
90 | RESPIRATORY_TRACT | Status: AC | PRN
Start: 2017-11-02 — End: 2017-11-02

## 2017-11-02 MED ORDER — acetaminophen (TYLENOL) tablet 650 mg
325 | Freq: Once | ORAL | Status: AC
Start: 2017-11-02 — End: 2017-11-02
  Administered 2017-11-02: 17:00:00 650 mg via ORAL

## 2017-11-02 MED ORDER — diphenhydrAMINE (BENADRYL) capsule 25 mg
25 | Freq: Once | ORAL | Status: AC
Start: 2017-11-02 — End: 2017-11-02
  Administered 2017-11-02: 17:00:00 25 mg via ORAL

## 2017-11-02 MED ORDER — sodium chloride flush 10 mL
Freq: Every day | INTRAMUSCULAR | Status: AC | PRN
Start: 2017-11-02 — End: 2017-11-02

## 2017-11-02 MED ORDER — sodium chloride 0.9 % infusion
Freq: Every day | INTRAVENOUS | Status: AC | PRN
Start: 2017-11-02 — End: 2017-11-02

## 2017-11-02 MED FILL — GAMMAGARD LIQUID 10 % INJECTION SOLUTION: 10 10 % | INTRAMUSCULAR | Qty: 250

## 2017-11-02 MED FILL — TYLENOL 325 MG TABLET: 325 325 mg | ORAL | Qty: 2

## 2017-11-02 MED FILL — DIPHENHYDRAMINE 25 MG CAPSULE: 25 25 mg | ORAL | Qty: 1

## 2017-11-02 NOTE — Unmapped (Signed)
Chief Complaint   Patient presents with   ??? Follow-up     HPI  Ashley Madden is a 84 y.o. female with history of breast cancer, non-hodgkin lymphoma, history of immunoglobulin deficiency. She has been unable to receive her IVIG in the past several months and has now been re-approved here in our system.    She comes today accompanied by her husband.  Reports she feels well, no recent infections.  She has no bowel issues.  Energy level fair.  She and her husband are leaving for a three week trip to [place] next week.  She has had her flu vaccine in the past two weeks.  She is healing from having some lesions removed by derm on her left forearm and her right calf. Areas are covered today.  She reports no recent falls.    Histories   She has a past medical history of Breast cancer (CMS Dx) and Immunoglobulin deficiency (CMS Dx) (07/08/2011).    She has a past surgical history that includes Breast biopsy.    Her family history includes Breast Cancer (age of onset: 51) in her sister.    She reports that she has never smoked. She has never used smokeless tobacco. She reports current alcohol use. She reports that she does not use drugs.    Allergies  Patient has no known allergies.    Medications  Current Outpatient Medications   Medication Sig   ??? ADVAIR DISKUS INHALE 1 PUFF INTO THE LUNGS TWICE DAILY   ??? aspirin Take 81 mg by mouth daily.   ??? atenolol Take 1 tablet (50 mg total) by mouth daily.   ??? atorvastatin Take 1 tablet by mouth daily   ??? calcipotriene Apply twice daily to involved areas for 5 days   ??? calcium citrate/vitamin D3 (CITRACAL + D ORAL) Take 100 mg by mouth daily.   ??? cephALEXin Take by mouth.   ??? dicyclomine Take 1 capsule (10 mg total) by mouth as needed. Take one capsule by mouth every 6 hours as needed for abdominal cramping (Patient taking differently: Take 10 mg by mouth every 6 hours as needed (Abdominal cramping).    )   ??? fluorouracil Apply twice daily to involved areas for 2 weeks as  directed.   ??? levothyroxine Take 100 mcg by mouth daily.   ??? multivit-iron-min-folic acid Chew 1 tablet by mouth daily.   ??? psyllium Take 1 packet by mouth daily.   ??? traZODone TK 1 T PO  QHS PRF INSOMNIA     No current facility-administered medications for this visit.          The following portions of the patient's history were reviewed and updated as appropriate: allergies, current medications, past family history, past medical history, past social history, past surgical history and problem list.    Review of Systems   Constitutional: Negative for activity change, fatigue and fever.   HENT: Negative for congestion, rhinorrhea and sore throat.    Eyes: Negative for visual disturbance.   Respiratory: Negative for cough, chest tightness and shortness of breath.    Cardiovascular: Negative for chest pain, palpitations and leg swelling.   Gastrointestinal: Negative for constipation and diarrhea.   Genitourinary: Negative for dysuria.   Musculoskeletal: Positive for arthralgias (general intermittent).   Skin: Positive for wound (left forearm and right calf from lesion removal). Negative for color change.   Neurological: Negative for dizziness, weakness and headaches.   Psychiatric/Behavioral: The patient is not nervous/anxious.  Vitals  Blood pressure 105/71, pulse 77, temperature 97 ??F (36.1 ??C), temperature source Temporal, resp. rate 16, height 5' 4 (1.626 m), weight 138 lb 12.8 oz (63 kg), SpO2 97 %.    Physical Exam   Constitutional: She is oriented to person, place, and time. She appears well-developed and well-nourished. No distress.   HENT:   Head: Normocephalic.   Mouth/Throat: Uvula is midline, oropharynx is clear and moist and mucous membranes are normal.   Eyes: Pupils are equal, round, and reactive to light. Conjunctivae are normal. Right eye exhibits no discharge. Left eye exhibits no discharge.   Neck: Neck supple. No tracheal deviation present.   Cardiovascular: Normal rate and regular rhythm.    No murmur heard.  Pulmonary/Chest: Effort normal. She has no wheezes. She has no rales.   Abdominal: Soft. Bowel sounds are normal. She exhibits no distension. There is no tenderness. Musculoskeletal: Normal range of motion.         General: No tenderness or edema.      Comments: Does not use assistive device     Lymphadenopathy:     She has no cervical adenopathy.        Right: No supraclavicular adenopathy present.        Left: No supraclavicular adenopathy present.   Neurological: She is alert and oriented to person, place, and time. Coordination normal.   Skin: Skin is warm. No bruising and no rash noted. No cyanosis or erythema. Nails show no clubbing.   Bandage in place left forearm and right calf post lesion removal by derm.   Psychiatric: She has a normal mood and affect. Her behavior is normal. Judgment and thought content normal.        Review of Lab Results  Lab Results   Component Value Date    WBC 3.4 (L) 11/02/2017    HGB 13.3 11/02/2017    HCT 39.2 11/02/2017    PLT 130 (L) 11/02/2017    CREATININE 0.77 11/02/2017    BUN 21 11/02/2017    PROT 6.8 11/02/2017    AST 25 11/02/2017    ALT 22 11/02/2017    BILITOT 0.9 11/02/2017    CALCIUM 9.2 11/02/2017    LDH 188 06/20/2017     Cancer Staging:  Cancer Staging  Malignant neoplasm of female breast (CMS Dx)  Staging form: Breast, AJCC 6th Edition  - Clinical: No stage assigned - Unsigned  - Pathologic: Stage I (T1, N0, M0) - Signed by Ashley Loser, MD on 03/25/2015    Assessment:  Ashley Madden is a 84 y.o. female with history of breast cancer and non-hodgkin lymphoma, IgG deficiency. She is doing well in clinic today, anxious to resume IVIG infusions, she has upcoming international travel.    Plan:  Labs reviewed acceptable for treatment  Patient will continue with IVIG infusions today.  She will return to see Ashley Madden in 4-6 weeks for follow up and continuation of IVIG.  She knows to call in the interim with any new concerns or questions.    The patient and  her husband were given the opportunity to ask questions and feel they were answered to their satisfaction.    Medical Decision Making:  The following items were considered in medical decision making:  Review / order clinical lab tests  Medication toxicity monitoring performed

## 2017-11-02 NOTE — Unmapped (Signed)
Ashley Madden receives ivig without incident.  When infusion finished, pt attempts to take dressing off of IV site by herself, in doing so, she tore off a large piece of skin.  Adaptic, gauze, and coban were applied to site.  Pt is seeing surgeon on Friday for another would and will have this site reevaluated at that time.

## 2017-12-07 ENCOUNTER — Ambulatory Visit: Admit: 2017-12-07 | Payer: MEDICARE | Attending: Hematology & Oncology

## 2017-12-07 ENCOUNTER — Inpatient Hospital Stay: Admit: 2017-12-07 | Discharge: 2017-12-12 | Payer: MEDICARE | Attending: Hematology & Oncology

## 2017-12-07 ENCOUNTER — Other Ambulatory Visit: Admit: 2017-12-07 | Payer: MEDICARE

## 2017-12-07 ENCOUNTER — Ambulatory Visit: Admit: 2017-12-07 | Payer: MEDICARE

## 2017-12-07 DIAGNOSIS — Z1231 Encounter for screening mammogram for malignant neoplasm of breast: Secondary | ICD-10-CM

## 2017-12-07 DIAGNOSIS — C50919 Malignant neoplasm of unspecified site of unspecified female breast: Secondary | ICD-10-CM

## 2017-12-07 DIAGNOSIS — D803 Selective deficiency of immunoglobulin G [IgG] subclasses: Secondary | ICD-10-CM

## 2017-12-07 LAB — COMPREHENSIVE METABOLIC PANEL
ALT: 26 U/L (ref 7–52)
AST: 26 U/L (ref 13–39)
Albumin: 4.4 g/dL (ref 3.5–5.7)
Alkaline Phosphatase: 76 U/L (ref 36–125)
Anion Gap: 5 mmol/L (ref 3–16)
BUN: 20 mg/dL (ref 7–25)
CO2: 32 mmol/L (ref 21–33)
Calcium: 9.3 mg/dL (ref 8.6–10.3)
Chloride: 103 mmol/L (ref 98–110)
Creatinine: 0.66 mg/dL (ref 0.60–1.30)
Glucose: 97 mg/dL (ref 70–100)
Osmolality, Calculated: 293 mOsm/kg (ref 278–305)
Potassium: 3.9 mmol/L (ref 3.5–5.3)
Sodium: 140 mmol/L (ref 133–146)
Total Bilirubin: 0.6 mg/dL (ref 0.0–1.5)
Total Protein: 6.5 g/dL (ref 6.4–8.9)
eGFR AA CKD-EPI: >90 See note.
eGFR NONAA CKD-EPI: 81 See note.

## 2017-12-07 LAB — CBC
Hematocrit: 40.5 % (ref 35.0–45.0)
Hemoglobin: 13.5 g/dL (ref 11.7–15.5)
MCH: 29.7 pg (ref 27.0–33.0)
MCHC: 33.3 g/dL (ref 32.0–36.0)
MCV: 89.2 fL (ref 80.0–100.0)
MPV: 10.5 fL (ref 7.5–11.5)
Platelets: 115 10*3/uL (ref 140–400)
RBC: 4.54 10*6/uL (ref 3.80–5.10)
RDW: 15.7 % (ref 11.0–15.0)
WBC: 4 10*3/uL (ref 3.8–10.8)

## 2017-12-07 LAB — DIFFERENTIAL
Basophils Absolute: 12 /uL (ref 0–200)
Basophils Relative: 0.3 % (ref 0.0–1.0)
Eosinophils Absolute: 72 /uL (ref 15–500)
Eosinophils Relative: 1.8 % (ref 0.0–8.0)
Lymphocytes Absolute: 604 /uL (ref 850–3900)
Lymphocytes Relative: 15.1 % (ref 15.0–45.0)
Monocytes Absolute: 636 /uL (ref 200–950)
Monocytes Relative: 15.9 % (ref 0.0–12.0)
Neutrophils Absolute: 2676 /uL (ref 1500–7800)
Neutrophils Relative: 66.9 % (ref 40.0–80.0)
nRBC: 0 /100 WBC (ref 0–0)

## 2017-12-07 MED ORDER — sodium chloride 0.9 % infusion
Freq: Every day | INTRAVENOUS | Status: AC | PRN
Start: 2017-12-07 — End: 2017-12-07

## 2017-12-07 MED ORDER — albuterol (PROVENTIL;VENTOLIN;PROAIR) inhaler 1-2 puff
90 | RESPIRATORY_TRACT | Status: AC | PRN
Start: 2017-12-07 — End: 2017-12-07

## 2017-12-07 MED ORDER — EPINEPHrine (ADRENALIN) 1 mg/mL injection 0.3 mg
1 | Freq: Every day | INTRAMUSCULAR | Status: AC | PRN
Start: 2017-12-07 — End: 2017-12-07

## 2017-12-07 MED ORDER — acetaminophen (TYLENOL) tablet 650 mg
325 | Freq: Once | ORAL | Status: AC
Start: 2017-12-07 — End: 2017-12-07
  Administered 2017-12-07: 19:00:00 650 mg via ORAL

## 2017-12-07 MED ORDER — immune globulin (human) (IgG) (GAMMAGARD) 25 g
10 | Freq: Once | INTRAMUSCULAR | Status: AC
Start: 2017-12-07 — End: 2017-12-07
  Administered 2017-12-07: 20:00:00 25 g/kg via INTRAVENOUS

## 2017-12-07 MED ORDER — hydrocortisone sod succ (PF)(SOLU-CORTEF) 100 mg injection
100 | Freq: Every day | INTRAMUSCULAR | Status: AC | PRN
Start: 2017-12-07 — End: 2017-12-07

## 2017-12-07 MED ORDER — diphenhydrAMINE (BENADRYL) capsule 25 mg
25 | Freq: Once | ORAL | Status: AC
Start: 2017-12-07 — End: 2017-12-07
  Administered 2017-12-07: 19:00:00 25 mg via ORAL

## 2017-12-07 MED ORDER — diphenhydrAMINE (BENADRYL) injection 25 mg
50 | Freq: Every day | INTRAMUSCULAR | Status: AC | PRN
Start: 2017-12-07 — End: 2017-12-07

## 2017-12-07 MED ORDER — heparin lock flush 500 Units
100 | Freq: Every day | INTRAVENOUS | Status: AC | PRN
Start: 2017-12-07 — End: 2017-12-07

## 2017-12-07 MED ORDER — sodium chloride flush 10 mL
Freq: Every day | INTRAMUSCULAR | Status: AC | PRN
Start: 2017-12-07 — End: 2017-12-07

## 2017-12-07 MED FILL — DIPHENHYDRAMINE 25 MG CAPSULE: 25 25 mg | ORAL | Qty: 1

## 2017-12-07 MED FILL — GAMMAGARD LIQUID 10 % INJECTION SOLUTION: 10 10 % | INTRAMUSCULAR | Qty: 250

## 2017-12-07 MED FILL — TYLENOL 325 MG TABLET: 325 325 mg | ORAL | Qty: 2

## 2017-12-07 NOTE — Unmapped (Signed)
I Dr. Dionisio Paschal Montrice Montuori saw Tineshia Phimmasone on 12/07/2017.    The following is my dictated note detailing the patient's symptoms, examination, assessment and plan that I performed during this visit    DIAGNOSES:  Breast cancer, T1 N0 M0; non-Hodgkin lymphoma; history of pulmonary fibrosis; history of IgG deficiency; multiple infections; currently on IVIG replacement.    HISTORY OF PRESENT ILLNESS:  Ms. Mukherjee is here with her husband.  Overall, she is doing well.  She will continue with IVIG today.  They just got back from extensive [place] trip, which they both enjoyed.  Unfortunately, her husband had lots of respiratory problems while they were gone.  For the most part, she feels things are going okay.  She is slightly overdue for mammogram, but she will have it today.  She also knows when she comes back she needs to have a repeat bone density as well as a vitamin D level checked.  She is otherwise doing fairly well.  She has some skin issues, which is normal for her.  She is receiving a 5-FU compound from her dermatologist.  She has had no new real active skin cancers.    REVIEW OF SYSTEMS:  No other GI, GU, pulmonary, cardiac, neurologic, musculoskeletal, cutaneous, endocrine, psychiatric, hematologic, infectious disease or other major difficulties.  Remainder of a 10-point review of systems is unchanged.    PHYSICAL EXAMINATION:  GENERAL:  Alert, oriented, and in no acute distress.  THROAT:  Clear without ulceration or Candida.  ABDOMEN:  Soft without ascites, jaundice or organomegaly.  EXTREMITIES:  No Tilda Samudio extremity edema.  SKIN:  Without rash.  No ecchymoses.  MUSCULOSKELETAL:  No change in arthritis.  No overt fracture.  NEUROLOGIC:  Mental status, alert and oriented.  Cranial nerves unchanged.  Motor and sensory unchanged.  Gait is negative.  She is not using an assist device.  PSYCHIATRIC:  She is not overly anxious or depressed.    LABORATORY TEST:  All per flow sheet.    IMPRESSION:    1. Doing well.   Continue IVIG.  2. We discussed her skin cancer.  She will continue with 5-FU cream, skin monitoring.  She does know to wear sunscreen particularly when she spends the winter in [place].    3. Bone health addressed.  Vitamin D level will be checked.  DEXA scan is ordered.  4. Mammogram will be performed today.  She will be notified of results.  She will return as directed.      Caprice Renshaw, MD      EEL/AQ  DD:  12/07/2017 13:01:08  DT:  12/07/2017 13:32:45    JOB#:  [id number]

## 2017-12-07 NOTE — Progress Notes (Signed)
See dictated note.

## 2017-12-07 NOTE — Unmapped (Signed)
Pt here for IVIG, doing well, no complaints.  Pt assessed and deemed to be a HIGH fall risk, Oriented to department/room, ID and fall risk bracelet in place, chair locked, call light within reach, Patient Safety pamphlet given/reviewed, instructed to call for assistance when getting out of chair, fall risk explained, questions answered, pt voices understanding.  PIV inserted in left FA, good blood return noted, flushed.  Tx tol well, PIV remvoed. Pt d/c'ed amb with AVS.

## 2018-07-06 ENCOUNTER — Ambulatory Visit: Payer: MEDICARE

## 2018-07-06 ENCOUNTER — Ambulatory Visit: Admit: 2018-07-06 | Payer: MEDICARE

## 2018-07-06 ENCOUNTER — Ambulatory Visit: Admit: 2018-07-06 | Payer: MEDICARE | Attending: Hematology & Oncology

## 2018-07-06 DIAGNOSIS — C50919 Malignant neoplasm of unspecified site of unspecified female breast: Secondary | ICD-10-CM

## 2018-07-06 DIAGNOSIS — D803 Selective deficiency of immunoglobulin G [IgG] subclasses: Secondary | ICD-10-CM

## 2018-07-06 LAB — DIFFERENTIAL
Basophils Absolute: 11 /uL (ref 0–200)
Basophils Relative: 0.3 % (ref 0.0–1.0)
Eosinophils Absolute: 42 /uL (ref 15–500)
Eosinophils Relative: 1.1 % (ref 0.0–8.0)
Lymphocytes Absolute: 543 /uL — ABNORMAL LOW (ref 850–3900)
Lymphocytes Relative: 14.3 % — ABNORMAL LOW (ref 15.0–45.0)
Monocytes Absolute: 631 /uL (ref 200–950)
Monocytes Relative: 16.6 % — ABNORMAL HIGH (ref 0.0–12.0)
Neutrophils Absolute: 2573 /uL (ref 1500–7800)
Neutrophils Relative: 67.7 % (ref 40.0–80.0)
nRBC: 0 /100{WBCs} (ref 0–0)

## 2018-07-06 LAB — CBC
Hematocrit: 40.3 % (ref 35.0–45.0)
Hemoglobin: 13.7 g/dL (ref 11.7–15.5)
MCH: 30.4 pg (ref 27.0–33.0)
MCHC: 34 g/dL (ref 32.0–36.0)
MCV: 89.2 fL (ref 80.0–100.0)
MPV: 10.7 fL (ref 7.5–11.5)
Platelets: 133 10*3/uL (ref 140–400)
RBC: 4.51 10*6/uL (ref 3.80–5.10)
RDW: 14.4 % (ref 11.0–15.0)
WBC: 3.8 10*3/uL (ref 3.8–10.8)

## 2018-07-06 LAB — COMPREHENSIVE METABOLIC PANEL
ALT: 22 U/L (ref 7–52)
AST: 25 U/L (ref 13–39)
Albumin: 4.6 g/dL (ref 3.5–5.7)
Alkaline Phosphatase: 72 U/L (ref 36–125)
Anion Gap: 9 mmol/L (ref 3–16)
BUN: 28 mg/dL (ref 7–25)
CO2: 31 mmol/L (ref 21–33)
Calcium: 10.1 mg/dL (ref 8.6–10.3)
Chloride: 100 mmol/L (ref 98–110)
Creatinine: 0.75 mg/dL (ref 0.60–1.30)
Glucose: 122 mg/dL (ref 70–100)
Osmolality, Calculated: 297 mOsm/kg (ref 278–305)
Potassium: 4 mmol/L (ref 3.5–5.3)
Sodium: 140 mmol/L (ref 133–146)
Total Bilirubin: 0.8 mg/dL (ref 0.0–1.5)
Total Protein: 7.1 g/dL (ref 6.4–8.9)
eGFR AA CKD-EPI: 84 See note.
eGFR NONAA CKD-EPI: 72 See note.

## 2018-07-06 LAB — VITAMIN D 25 HYDROXY: Vit D, 25-Hydroxy: 50.2 ng/mL (ref 30.0–100.0)

## 2018-07-06 LAB — KAPPA/LAMBDA, FREE LIGHT CHAINS, SERUM
Kappa/Lambda Ratio: 1.73 ratio — ABNORMAL HIGH (ref 0.26–1.65)
Kappa: 17.3 mg/L (ref 3.3–19.4)
Lambda: 10 mg/L (ref 5.7–26.3)

## 2018-07-06 LAB — IGA: IgA: 95 mg/dL (ref 90.0–410.0)

## 2018-07-06 LAB — IGM: IgM: 123.8 mg/dL (ref 30.0–360.0)

## 2018-07-06 LAB — IGG: IgG: 828 mg/dL (ref 600.0–1560.0)

## 2018-07-06 MED ORDER — EPINEPHrine (ADRENALIN) 1 mg/mL injection 0.3 mg
1 | Freq: Every day | INTRAMUSCULAR | Status: AC | PRN
Start: 2018-07-06 — End: 2018-07-06

## 2018-07-06 MED ORDER — albuterol (PROVENTIL) inhaler 1-2 puff
90 | RESPIRATORY_TRACT | Status: AC | PRN
Start: 2018-07-06 — End: 2018-07-06

## 2018-07-06 MED ORDER — diphenhydrAMINE (BENADRYL) injection 25 mg
50 | Freq: Every day | INTRAMUSCULAR | Status: AC | PRN
Start: 2018-07-06 — End: 2018-07-06

## 2018-07-06 MED ORDER — diphenhydrAMINE (BENADRYL) capsule 25 mg
25 | Freq: Once | ORAL | Status: AC
Start: 2018-07-06 — End: 2018-07-06
  Administered 2018-07-06: 19:00:00 25 mg via ORAL

## 2018-07-06 MED ORDER — acetaminophen (TYLENOL) tablet 650 mg
325 | Freq: Once | ORAL | Status: AC
Start: 2018-07-06 — End: 2018-07-06
  Administered 2018-07-06: 19:00:00 650 mg via ORAL

## 2018-07-06 MED ORDER — heparin lock flush Syrg 500 Units
100 | Freq: Every day | INTRAVENOUS | Status: AC | PRN
Start: 2018-07-06 — End: 2018-07-06

## 2018-07-06 MED ORDER — sodium chloride flush 10 mL
Freq: Every day | INTRAMUSCULAR | Status: AC | PRN
Start: 2018-07-06 — End: 2018-07-06

## 2018-07-06 MED ORDER — sodium chloride 0.9 % infusion
Freq: Every day | INTRAVENOUS | Status: AC | PRN
Start: 2018-07-06 — End: 2018-07-06

## 2018-07-06 MED ORDER — hydrocortisone sod succ (PF) (SOLU-CORTEF) injection 100 mg
100 | Freq: Every day | INTRAMUSCULAR | Status: AC | PRN
Start: 2018-07-06 — End: 2018-07-06

## 2018-07-06 MED ORDER — immune globulin (human) (IgG) (GAMMAGARD) Soln 25 g
10 | Freq: Once | INTRAMUSCULAR | Status: AC
Start: 2018-07-06 — End: 2018-07-06
  Administered 2018-07-06: 20:00:00 25 g/kg via INTRAVENOUS

## 2018-07-06 MED FILL — GAMMAGARD LIQUID 10 % INJECTION SOLUTION: 10 10 % | INTRAMUSCULAR | Qty: 250

## 2018-07-06 MED FILL — DIPHENHYDRAMINE 25 MG CAPSULE: 25 25 mg | ORAL | Qty: 1

## 2018-07-06 MED FILL — TYLENOL 325 MG TABLET: 325 325 mg | ORAL | Qty: 2

## 2018-07-06 NOTE — Unmapped (Signed)
I Dr. Dionisio Paschal Dione Mccombie saw Ashley Madden on 07/06/2018.    The following is my dictated note detailing the patient's symptoms, examination, assessment and plan that I performed during this visit    DIAGNOSES:  Left-sided breast cancer, non-Hodgkin's lymphoma, IgG deficiency.    HISTORY OF PRESENT ILLNESS:  Ashley Madden is here for followup and continuation of IVIG.  Overall, she is doing fairly well.  She has had no intercurrent signs of infection, GI, GU, pulmonary, cardiac, neurologic, musculoskeletal, cutaneous, endocrine, psychiatric, or other major changes.  She has been in [place] all winter.  She and her husband and family have basically been healthy.  She tries to walk.  She is swimming virtually every day.  She is doing some exercises.  She has had no falls.  She states cardiac wise things have been stable.  She does know some people who have been COVID-19 positive, but no when she is associated with, and it has been no one in her family.    REVIEW OF SYSTEMS:  Remainder of a 10-point review of systems is unchanged.  No other new GI, GU, pulmonary, cardiac, neurologic, musculoskeletal, cutaneous, endocrine, psychiatric, hematologic, infectious disease, or other difficulties.  Remainder of a 10-point review of systems is unchanged.    PHYSICAL EXAM:  GENERAL:  Alert, oriented, and in no acute distress.  THROAT:  Clear without ulceration or Candida.  LUNGS:  Clear.  BREASTS:  Exam was not performed.  ABDOMEN:  Soft without ascites, jaundice, or organomegaly.  EXTREMITIES:  No Tove Wideman extremity edema, not clubbed.  SKIN:  Shows scattered areas of erythema secondary to 5-FU treatment for precancer and cancerous lesions, all appears to be healing.  There is no other active rash.  MUSCULOSKELETAL:  No change in distal extremity arthritis.  No overt fracture.  NEUROLOGIC:  Mental status, alert and oriented.  Cranial nerves unchanged.  Motor and sensory unchanged.  Gait, she is not using an assist device.  PSYCHIATRIC:   She is not overly anxious or depressed.    LABORATORY TEST:  All per flow sheet.    IMPRESSION:  Clinically doing fairly well, ready to continue IVIG.    PLAN:    1. We discussed bone health issues.  She is due for DEXA scan.  We will check vitamin D level.  2. Mammogram is due in November before she goes back to [place].  3. She will continue IVIG today.  She is getting it at an every 6-week interval.  4. We went over the social isolation of COVID-19.  She is aware of precautions and the stress of dealing with things.  She is aware of resources.  5. Copies of lab work given and reviewed, questions addressed, and she will return as directed.          EEL/AQ  DD:  07/06/2018 14:59:29  DT:  07/07/2018 00:54:34    JOB#:  [id number]

## 2018-07-06 NOTE — Unmapped (Signed)
[name] here today after MD appointment for IVIG infusion. #22 PIV inserted in RAC. Pt not a fall risk on assessment. See MD visit note for Vitals, Allergy and Medication review.

## 2018-07-06 NOTE — Unmapped (Signed)
See dictated note

## 2018-08-25 ENCOUNTER — Ambulatory Visit: Payer: MEDICARE | Attending: Hematology & Oncology

## 2018-08-25 ENCOUNTER — Ambulatory Visit: Payer: MEDICARE

## 2018-09-01 ENCOUNTER — Other Ambulatory Visit: Admit: 2018-09-01 | Payer: MEDICARE

## 2018-09-01 ENCOUNTER — Ambulatory Visit: Admit: 2018-09-01 | Payer: MEDICARE | Attending: Hematology & Oncology

## 2018-09-01 ENCOUNTER — Ambulatory Visit: Admit: 2018-09-01 | Payer: MEDICARE

## 2018-09-01 DIAGNOSIS — D803 Selective deficiency of immunoglobulin G [IgG] subclasses: Secondary | ICD-10-CM

## 2018-09-01 DIAGNOSIS — C50919 Malignant neoplasm of unspecified site of unspecified female breast: Secondary | ICD-10-CM

## 2018-09-01 LAB — COMPREHENSIVE METABOLIC PANEL
ALT: 22 U/L (ref 7–52)
AST: 28 U/L (ref 13–39)
Albumin: 4.2 g/dL (ref 3.5–5.7)
Alkaline Phosphatase: 68 U/L (ref 36–125)
Anion Gap: 10 mmol/L (ref 3–16)
BUN: 22 mg/dL (ref 7–25)
CO2: 29 mmol/L (ref 21–33)
Calcium: 9.1 mg/dL (ref 8.6–10.3)
Chloride: 101 mmol/L (ref 98–110)
Creatinine: 0.63 mg/dL (ref 0.60–1.30)
Glucose: 106 mg/dL — ABNORMAL HIGH (ref 70–100)
Osmolality, Calculated: 294 mosm/kg (ref 278–305)
Potassium: 3.9 mmol/L (ref 3.5–5.3)
Sodium: 140 mmol/L (ref 133–146)
Total Bilirubin: 0.7 mg/dL (ref 0.0–1.5)
Total Protein: 6.4 g/dL (ref 6.4–8.9)
eGFR AA CKD-EPI: >90 See note.
eGFR NONAA CKD-EPI: 81 See note.

## 2018-09-01 LAB — DIFFERENTIAL
Basophils Absolute: 11 /uL (ref 0–200)
Basophils Relative: 0.3 % (ref 0.0–1.0)
Eosinophils Absolute: 67 /uL (ref 15–500)
Eosinophils Relative: 1.9 % (ref 0.0–8.0)
Lymphocytes Absolute: 515 /uL — ABNORMAL LOW (ref 850–3900)
Lymphocytes Relative: 14.7 % — ABNORMAL LOW (ref 15.0–45.0)
Monocytes Absolute: 658 /uL (ref 200–950)
Monocytes Relative: 18.8 % — ABNORMAL HIGH (ref 0.0–12.0)
Neutrophils Absolute: 2251 /uL (ref 1500–7800)
Neutrophils Relative: 64.3 % (ref 40.0–80.0)
nRBC: 0 /100{WBCs} (ref 0–0)

## 2018-09-01 LAB — CBC
Hematocrit: 39.3 % (ref 35.0–45.0)
Hemoglobin: 13.7 g/dL (ref 11.7–15.5)
MCH: 30.9 pg (ref 27.0–33.0)
MCHC: 34.7 g/dL (ref 32.0–36.0)
MCV: 88.9 fL (ref 80.0–100.0)
MPV: 9.5 fL (ref 7.5–11.5)
Platelets: 112 10E3/uL — ABNORMAL LOW (ref 140–400)
RBC: 4.43 10E6/uL (ref 3.80–5.10)
RDW: 15.3 % — ABNORMAL HIGH (ref 11.0–15.0)
WBC: 3.5 10E3/uL — ABNORMAL LOW (ref 3.8–10.8)

## 2018-09-01 LAB — VITAMIN D 25 HYDROXY: Vit D, 25-Hydroxy: 44.7 ng/mL (ref 30.0–100.0)

## 2018-09-01 LAB — MAGNESIUM: Magnesium: 1.9 mg/dL (ref 1.5–2.5)

## 2018-09-01 MED ORDER — acetaminophen (TYLENOL) tablet 650 mg
325 | Freq: Once | ORAL | Status: AC
Start: 2018-09-01 — End: 2018-09-01
  Administered 2018-09-01: 17:00:00 650 mg via ORAL

## 2018-09-01 MED ORDER — diphenhydrAMINE (BENADRYL) injection 25 mg
50 | Freq: Every day | INTRAMUSCULAR | Status: AC | PRN
Start: 2018-09-01 — End: 2018-09-01

## 2018-09-01 MED ORDER — sodium chloride flush 10 mL
Freq: Every day | INTRAMUSCULAR | Status: AC | PRN
Start: 2018-09-01 — End: 2018-09-01

## 2018-09-01 MED ORDER — heparin lock flush Syrg 500 Units
100 | Freq: Every day | INTRAVENOUS | Status: AC | PRN
Start: 2018-09-01 — End: 2018-09-01

## 2018-09-01 MED ORDER — immune globulin (human) (IgG) (GAMMAGARD) Soln 25 g
10 | Freq: Once | INTRAMUSCULAR | Status: AC
Start: 2018-09-01 — End: 2018-09-01
  Administered 2018-09-01: 18:00:00 25 g/kg via INTRAVENOUS

## 2018-09-01 MED ORDER — EPINEPHrine (ADRENALIN) 1 mg/mL injection 0.3 mg
1 | Freq: Every day | INTRAMUSCULAR | Status: AC | PRN
Start: 2018-09-01 — End: 2018-09-01

## 2018-09-01 MED ORDER — diphenhydrAMINE (BENADRYL) capsule 25 mg
25 | Freq: Once | ORAL | Status: AC
Start: 2018-09-01 — End: 2018-09-01
  Administered 2018-09-01: 17:00:00 25 mg via ORAL

## 2018-09-01 MED ORDER — albuterol (PROVENTIL) 90 mcg/actuation inhaler 1-2 puff
90 | RESPIRATORY_TRACT | Status: AC | PRN
Start: 2018-09-01 — End: 2018-09-01

## 2018-09-01 MED ORDER — hydrocortisone sod succ (PF) (SOLU-CORTEF) injection 100 mg
100 | Freq: Every day | INTRAMUSCULAR | Status: AC | PRN
Start: 2018-09-01 — End: 2018-09-01

## 2018-09-01 MED ORDER — sodium chloride 0.9 % infusion
Freq: Every day | INTRAVENOUS | Status: AC | PRN
Start: 2018-09-01 — End: 2018-09-01
  Administered 2018-09-01: 17:00:00 25 mL/h via INTRAVENOUS

## 2018-09-01 MED ORDER — dicyclomine (BENTYL) 10 MG capsule
10 | ORAL_CAPSULE | ORAL | 3 refills | Status: AC | PRN
Start: 2018-09-01 — End: ?

## 2018-09-01 MED FILL — GAMMAGARD LIQUID 10 % INJECTION SOLUTION: 10 10 % | INTRAMUSCULAR | Qty: 250

## 2018-09-01 MED FILL — TYLENOL 325 MG TABLET: 325 325 mg | ORAL | Qty: 2

## 2018-09-01 MED FILL — DIPHENHYDRAMINE 25 MG CAPSULE: 25 25 mg | ORAL | Qty: 1

## 2018-09-01 NOTE — Nursing Note (Signed)
Ashley Madden arrives to treatment area ambulatory for IVIG. PIV #24 obtained in R FA with positive blood return. Pre-medications administered. Patient tolerated treatment well with no adverse reactions. PIV removed with catheter intact. AVS given, next appointment reviewed. Patient leaves treatment area ambulatory with no issues.    Jackalyn Lombard RN

## 2018-09-01 NOTE — Progress Notes (Signed)
See dictated note.

## 2018-09-01 NOTE — Progress Notes (Signed)
I Dr. Dionisio Paschal Adelayde Minney saw Ashley Madden on 09/01/2018.    The following is my dictated note detailing the patient's symptoms, examination, assessment and plan that I performed during this visit    DIAGNOSIS:  Breast cancer, non-Hodgkin lymphoma, IgG deficiency.    HISTORY OF PRESENT ILLNESS:  Ashley Madden is here for continuation of immunoglobulin replacement.  Overall, she is doing well.  She has had no intercurrent signs of infection, GI, GU, pulmonary, cardiac, neurologic, musculoskeletal, cutaneous, endocrine, psychiatric, hematologic, or other major difficulties.  In general, things have been going along without major complication.  She states that she and her husband have been COVID-19 free as far as they know.  She really does not get around very many people and tries to stay away even from her grandchildren because of her problems and her husband's.    REVIEW OF SYSTEMS:  She has had no other intercurrent signs of infection, GI, GU, pulmonary, cardiac, neurologic, musculoskeletal, cutaneous, endocrine, psychiatric, hematologic, infectious disease, or other difficulties.  Remainder of a 10-point review of systems is unchanged.    PHYSICAL EXAM:  GENERAL:  Alert, oriented, and in no acute distress.  HEENT:  Throat is clear.  She is wearing her mask.  LUNGS:  Clear.  CARDIAC EXAM:  No murmur, rub, or gallop.  LYMPH NODES:  No cervical, supra or infraclavicular adenopathy.  CHEST WALL:  Not examined.  ABDOMEN:  Soft without ascites, jaundice, or organomegaly.  EXTREMITIES:  No Ashley Madden extremity edema.  She is not clubbed.  SKIN:  Without rash.  No ecchymoses.  MUSCULOSKELETAL:  No change in arthritis.  No overt fracture.  NEUROLOGIC:  Mental status, alert and oriented.  Cranial nerves, unchanged.  Motor and sensory, unchanged.  Gait, she is not using an assist device.  PSYCHIATRIC:  Not overly anxious or depressed.    LABORATORY TEST:  As noted.    IMPRESSION:  Doing well.    PLAN:    1. The patient will continue IVIG.   I reviewed her levels from her last visit.  2. We discussed some of the stress and strain associated with COVID-19.  She is doing pretty well with isolation and is aware of resources.  3. Patient is due for mammogram the end of the year.  It has been ordered for her.  4. Bone health issues addressed.  Vitamin D level and DEXA ordered for the fall.  Labs otherwise reviewed.  She will return as directed.          EEL/AQ  DD:  09/01/2018 12:50:53  DT:  09/01/2018 19:45:05    JOB#:  [id number]

## 2018-10-13 ENCOUNTER — Inpatient Hospital Stay: Admit: 2018-10-13 | Payer: MEDICARE | Attending: Hematology & Oncology

## 2018-10-13 ENCOUNTER — Other Ambulatory Visit: Admit: 2018-10-13 | Payer: MEDICARE

## 2018-10-13 ENCOUNTER — Ambulatory Visit: Admit: 2018-10-13 | Payer: MEDICARE

## 2018-10-13 ENCOUNTER — Ambulatory Visit: Admit: 2018-10-13 | Payer: MEDICARE | Attending: Hematology & Oncology

## 2018-10-13 DIAGNOSIS — D803 Selective deficiency of immunoglobulin G [IgG] subclasses: Secondary | ICD-10-CM

## 2018-10-13 DIAGNOSIS — C50919 Malignant neoplasm of unspecified site of unspecified female breast: Secondary | ICD-10-CM

## 2018-10-13 LAB — KAPPA/LAMBDA, FREE LIGHT CHAINS, SERUM
Kappa/Lambda Ratio: 1.75 ratio (ref 0.26–1.65)
Kappa: 17.7 mg/L (ref 3.3–19.4)
Lambda: 10.1 mg/L (ref 5.7–26.3)

## 2018-10-13 LAB — DIFFERENTIAL
Basophils Absolute: 7 /uL (ref 0–200)
Basophils Relative: 0.2 % (ref 0.0–1.0)
Eosinophils Absolute: 74 /uL (ref 15–500)
Eosinophils Relative: 2 % (ref 0.0–8.0)
Lymphocytes Absolute: 481 /uL — ABNORMAL LOW (ref 850–3900)
Lymphocytes Relative: 13 % — ABNORMAL LOW (ref 15.0–45.0)
Monocytes Absolute: 566 /uL (ref 200–950)
Monocytes Relative: 15.3 % — ABNORMAL HIGH (ref 0.0–12.0)
Neutrophils Absolute: 2572 /uL (ref 1500–7800)
Neutrophils Relative: 69.5 % (ref 40.0–80.0)
nRBC: 0 /100{WBCs} (ref 0–0)

## 2018-10-13 LAB — IGM: IgM: 132.4 mg/dL (ref 30.0–360.0)

## 2018-10-13 LAB — COMPREHENSIVE METABOLIC PANEL
ALT: 25 U/L (ref 7–52)
AST: 27 U/L (ref 13–39)
Albumin: 4.2 g/dL (ref 3.5–5.7)
Alkaline Phosphatase: 66 U/L (ref 36–125)
Anion Gap: 7 mmol/L (ref 3–16)
BUN: 22 mg/dL (ref 7–25)
CO2: 30 mmol/L (ref 21–33)
Calcium: 9.4 mg/dL (ref 8.6–10.3)
Chloride: 103 mmol/L (ref 98–110)
Creatinine: 0.82 mg/dL (ref 0.60–1.30)
Glucose: 83 mg/dL (ref 70–100)
Osmolality, Calculated: 292 mOsm/kg (ref 278–305)
Potassium: 3.8 mmol/L (ref 3.5–5.3)
Sodium: 140 mmol/L (ref 133–146)
Total Bilirubin: 0.9 mg/dL (ref 0.0–1.5)
Total Protein: 6.7 g/dL (ref 6.4–8.9)
eGFR AA CKD-EPI: 75 See note.
eGFR NONAA CKD-EPI: 65 See note.

## 2018-10-13 LAB — CBC
Hematocrit: 40 % (ref 35.0–45.0)
Hemoglobin: 13.6 g/dL (ref 11.7–15.5)
MCH: 30.9 pg (ref 27.0–33.0)
MCHC: 34 g/dL (ref 32.0–36.0)
MCV: 90.8 fL (ref 80.0–100.0)
MPV: 9.9 fL (ref 7.5–11.5)
Platelets: 130 10*3/uL (ref 140–400)
RBC: 4.4 10*6/uL (ref 3.80–5.10)
RDW: 15.3 % (ref 11.0–15.0)
WBC: 3.7 10*3/uL (ref 3.8–10.8)

## 2018-10-13 LAB — IGG: IgG: 742 mg/dL (ref 600.0–1560.0)

## 2018-10-13 LAB — IGA: IgA: 70 mg/dL (ref 90.0–410.0)

## 2018-10-13 MED ORDER — albuterol (PROVENTIL) 90 mcg/actuation inhaler 1-2 puff
90 | RESPIRATORY_TRACT | Status: AC | PRN
Start: 2018-10-13 — End: 2018-10-13

## 2018-10-13 MED ORDER — acetaminophen (TYLENOL) tablet 650 mg
325 | Freq: Once | ORAL | Status: AC
Start: 2018-10-13 — End: 2018-10-13
  Administered 2018-10-13: 17:00:00 650 mg via ORAL

## 2018-10-13 MED ORDER — sodium chloride 0.9 % infusion
Freq: Every day | INTRAVENOUS | Status: AC | PRN
Start: 2018-10-13 — End: 2018-10-13
  Administered 2018-10-13: 17:00:00 25 mL/h via INTRAVENOUS

## 2018-10-13 MED ORDER — heparin lock flush Syrg 500 Units
100 | Freq: Every day | INTRAVENOUS | Status: AC | PRN
Start: 2018-10-13 — End: 2018-10-13

## 2018-10-13 MED ORDER — EPINEPHrine (ADRENALIN) 1 mg/mL injection 0.3 mg
1 | Freq: Every day | INTRAMUSCULAR | Status: AC | PRN
Start: 2018-10-13 — End: 2018-10-13

## 2018-10-13 MED ORDER — immune globulin (human) (IgG) (GAMMAGARD) Soln 25 g
10 | Freq: Once | INTRAMUSCULAR | Status: AC
Start: 2018-10-13 — End: 2018-10-13
  Administered 2018-10-13: 17:00:00 25 g/kg via INTRAVENOUS

## 2018-10-13 MED ORDER — hydrocortisone sod succ (PF) (SOLU-CORTEF) injection 100 mg
100 | Freq: Every day | INTRAMUSCULAR | Status: AC | PRN
Start: 2018-10-13 — End: 2018-10-13

## 2018-10-13 MED ORDER — diphenhydrAMINE (BENADRYL) injection 25 mg
50 | Freq: Every day | INTRAMUSCULAR | Status: AC | PRN
Start: 2018-10-13 — End: 2018-10-13

## 2018-10-13 MED ORDER — sodium chloride flush 10 mL
Freq: Every day | INTRAMUSCULAR | Status: AC | PRN
Start: 2018-10-13 — End: 2018-10-13

## 2018-10-13 MED ORDER — diphenhydrAMINE (BENADRYL) capsule 25 mg
25 | Freq: Once | ORAL | Status: AC
Start: 2018-10-13 — End: 2018-10-13
  Administered 2018-10-13: 17:00:00 25 mg via ORAL

## 2018-10-13 MED FILL — DIPHENHYDRAMINE 25 MG CAPSULE: 25 25 mg | ORAL | Qty: 1

## 2018-10-13 MED FILL — GAMMAGARD LIQUID 10 % INJECTION SOLUTION: 10 10 % | INTRAMUSCULAR | Qty: 250

## 2018-10-13 MED FILL — TYLENOL 325 MG TABLET: 325 325 mg | ORAL | Qty: 2

## 2018-10-13 NOTE — Unmapped (Signed)
I Dr. Dionisio Paschal Teckla Christiansen saw Ashley Madden on 10/13/2018.    The following is my dictated note detailing the patient's symptoms, examination, assessment and plan that I performed during this visit    DIAGNOSES:  Left-sided breast cancer T1 N0 M0, history of non-Hodgkin's lymphoma, history of immunoglobulin deficiency, on long-term IVIG.    HISTORY OF PRESENT ILLNESS:  Ashley Madden is here for IVIG.  She is doing pretty well.  She is very upset because her primary care physician, Dr. Laurier Nancy is retiring.  She is going to see him 1 more time for physical, actually next week.  She and her husband will be returning to spend the winter in [place] before long.  She has developed some nonspecific chest discomfort.  She states it hurts more when she moves.  She does not give direct signs of radiculopathy.  She states it lasts sometimes a second, sometimes it is longer.  She does not think it is GI related.  It is certainly not related to eating.  It happens at night sometimes, sometimes it is in the daytime.  She does not have any nausea, diaphoresis, and left-sided chest discomfort.  She is going to see Dr. Laurier Nancy next week and she has not had any other major big changes.  No fevers, chills, or cough.  She does have osteoporosis and is on management, but she has not had a recent bone density.    REVIEW OF SYSTEMS:  No other GI, GU, pulmonary, cardiac, neurologic, musculoskeletal, cutaneous, endocrine, psychiatric, hematologic, and infectious disease.  Remainder of a 10-point review of systems is unchanged.    PHYSICAL EXAM:  GENERAL:  Alert and oriented, in no acute distress.  THROAT:  Clear without ulceration or Candida.  LUNG:  No crackles.  No wheezing.  CARDIAC:  Negative.  ABDOMEN:  Soft without ascites, jaundice, or organomegaly.  BREAST:  Chest wall mastectomy site looks fine.  Right breast unremarkable.  EXTREMITIES:  No Dereona Kolodny extremity edema she is not clubbed.  SKIN:  Shows no rash change.  No  ecchymoses.  MUSCULOSKELETAL:  No change in arthritis.  No overt fracture.  NEUROLOGIC AND MENTAL STATUS:  Alert and oriented.  Cranial nerves unchanged.  Motor and sensory, unchanged.  Gait, not using an assist device.  PSYCHIATRIC:  Not overly anxious or depressed.    LABORATORY TEST:  As noted.    IMPRESSION:    1. Doing fairly well.  Chest pain, it does not sound cardiac and I will let her primary care doctor discuss evaluation with this for her next week.  I am going to send her for a chest x-ray to make sure it is not a cord compression.  She has a history of non-Hodgkin's lymphoma in the thoracic spine.  I also want to make sure that there is no signs of osteoporotic compression fracture, pneumonia, etc.  I did tell her that if symptoms persist and there is anything else suspicious that she will need MRI and other additional testing.  2. She will continue with IVIG.  3. She knows she needs flu shot.  She will get it from PCP next week.  4. Labs reviewed.  LDH will be added.  5. She does know that she may need more evaluation depending upon what we find.  It sounds like she and her husband have planned to return to [place] in the very near future.            EEL/AQ  DD:  10/13/2018 12:13:23  DT:  10/14/2018 05:18:15    JOB#:  [id number]

## 2018-10-13 NOTE — Nursing Note (Signed)
Ashley Madden arrives to treatment area ambulatory for IVIG. PIV #22 obtained in R FA with positive blood return. Pre-medications administered. Patient tolerated treatment well with no adverse reactions. PIV removed with catheter intact. No AVS given, patient to go to [place] for the winter and will make appointment when she returns. Patient leaves treatment area ambulatory with no issues.    Larence Penning RN

## 2018-10-13 NOTE — Progress Notes (Signed)
See dictated note.

## 2018-10-24 NOTE — Telephone Encounter (Signed)
Patient notified of Xray results. Patient voiced understanding.

## 2018-10-24 NOTE — Unmapped (Signed)
October 24, 2018 1:16 PM    Ashley Madden has called to obtain intepretation XR CHEST PA AND LATERAL (10/13/2018) results.    ?? She has a scheduled OV with her PCP tomorrow and has requested results be faxed to the office of:    Adin Hector   [phone] (Work)  [phone] (Fax). Results have been faxed at this time. Please be advised.        Please contact the patient [phone] (home).Patient has given verbal consent to leave a detailed message on voicemail if unavailable.

## 2019-08-03 ENCOUNTER — Ambulatory Visit: Admit: 2019-08-03 | Payer: MEDICARE

## 2019-08-03 ENCOUNTER — Ambulatory Visit: Admit: 2019-08-03 | Payer: MEDICARE | Attending: Hematology & Oncology

## 2019-08-03 DIAGNOSIS — D803 Selective deficiency of immunoglobulin G [IgG] subclasses: Secondary | ICD-10-CM

## 2019-08-03 DIAGNOSIS — C50919 Malignant neoplasm of unspecified site of unspecified female breast: Secondary | ICD-10-CM

## 2019-08-03 LAB — CBC
Hematocrit: 37.5 % (ref 35.0–45.0)
Hemoglobin: 13.2 g/dL (ref 11.7–15.5)
MCH: 31.5 pg (ref 27.0–33.0)
MCHC: 35.2 g/dL (ref 32.0–36.0)
MCV: 89.7 fL (ref 80.0–100.0)
MPV: 10.2 fL (ref 7.5–11.5)
Platelets: 128 10*3/uL (ref 140–400)
RBC: 4.18 10*6/uL (ref 3.80–5.10)
RDW: 14.3 % (ref 11.0–15.0)
WBC: 4.1 10*3/uL (ref 3.8–10.8)

## 2019-08-03 LAB — COMPREHENSIVE METABOLIC PANEL
ALT: 29 U/L (ref 7–52)
AST: 30 U/L (ref 13–39)
Albumin: 4.2 g/dL (ref 3.5–5.7)
Alkaline Phosphatase: 61 U/L (ref 36–125)
Anion Gap: 8 mmol/L (ref 3–16)
BUN: 26 mg/dL (ref 7–25)
CO2: 30 mmol/L (ref 21–33)
Calcium: 9.5 mg/dL (ref 8.6–10.3)
Chloride: 101 mmol/L (ref 98–110)
Creatinine: 0.86 mg/dL (ref 0.60–1.30)
Glucose: 130 mg/dL (ref 70–100)
Osmolality, Calculated: 295 mOsm/kg (ref 278–305)
Potassium: 3.6 mmol/L (ref 3.5–5.3)
Sodium: 139 mmol/L (ref 133–146)
Total Bilirubin: 1.2 mg/dL (ref 0.0–1.5)
Total Protein: 6.7 g/dL (ref 6.4–8.9)
eGFR AA CKD-EPI: 70 See note.
eGFR NONAA CKD-EPI: 61 See note.

## 2019-08-03 LAB — DIFFERENTIAL
Basophils Absolute: 12 /uL (ref 0–200)
Basophils Relative: 0.3 % (ref 0.0–1.0)
Eosinophils Absolute: 33 /uL (ref 15–500)
Eosinophils Relative: 0.8 % (ref 0.0–8.0)
Lymphocytes Absolute: 664 /uL — ABNORMAL LOW (ref 850–3900)
Lymphocytes Relative: 16.2 % (ref 15.0–45.0)
Monocytes Absolute: 746 /uL (ref 200–950)
Monocytes Relative: 18.2 % — ABNORMAL HIGH (ref 0.0–12.0)
Neutrophils Absolute: 2645 /uL (ref 1500–7800)
Neutrophils Relative: 64.5 % (ref 40.0–80.0)
nRBC: 0 /100{WBCs} (ref 0–0)

## 2019-08-03 LAB — VITAMIN D 25 HYDROXY: Vit D, 25-Hydroxy: 41.3 ng/mL (ref 30.0–100.0)

## 2019-08-03 MED ORDER — albuterol (PROVENTIL) 90 mcg/actuation inhaler 1-2 puff
90 | RESPIRATORY_TRACT | Status: AC | PRN
Start: 2019-08-03 — End: 2019-08-03

## 2019-08-03 MED ORDER — sodium chloride 0.9 % infusion
Freq: Every day | INTRAVENOUS | Status: AC | PRN
Start: 2019-08-03 — End: 2019-08-03
  Administered 2019-08-03: 17:00:00 25 mL/h via INTRAVENOUS

## 2019-08-03 MED ORDER — heparin lock flush Syrg 500 Units
100 | Freq: Every day | INTRAVENOUS | Status: AC | PRN
Start: 2019-08-03 — End: 2019-08-03

## 2019-08-03 MED ORDER — EPINEPHrine (ADRENALIN) 1 mg/mL injection 0.3 mg
1 | Freq: Every day | INTRAMUSCULAR | Status: AC | PRN
Start: 2019-08-03 — End: 2019-08-03

## 2019-08-03 MED ORDER — hydrocortisone sod succ (PF) (SOLU-CORTEF) injection 100 mg
100 | Freq: Every day | INTRAMUSCULAR | Status: AC | PRN
Start: 2019-08-03 — End: 2019-08-03

## 2019-08-03 MED ORDER — immune globulin (human) (IgG) (GAMMAGARD) Soln 25 g
10 | Freq: Once | INTRAMUSCULAR | Status: AC
Start: 2019-08-03 — End: 2019-08-03
  Administered 2019-08-03: 17:00:00 25 g/kg via INTRAVENOUS

## 2019-08-03 MED ORDER — diphenhydrAMINE (BENADRYL) injection 25 mg
50 | Freq: Every day | INTRAMUSCULAR | Status: AC | PRN
Start: 2019-08-03 — End: 2019-08-03

## 2019-08-03 MED ORDER — sodium chloride flush 10 mL
Freq: Every day | INTRAMUSCULAR | Status: AC | PRN
Start: 2019-08-03 — End: 2019-08-03

## 2019-08-03 MED ORDER — acetaminophen (TYLENOL) tablet 650 mg
325 | Freq: Once | ORAL | Status: AC
Start: 2019-08-03 — End: 2019-08-03
  Administered 2019-08-03: 16:00:00 650 mg via ORAL

## 2019-08-03 MED ORDER — diphenhydrAMINE (BENADRYL) capsule 25 mg
25 | Freq: Once | ORAL | Status: AC
Start: 2019-08-03 — End: 2019-08-03
  Administered 2019-08-03: 16:00:00 25 mg via ORAL

## 2019-08-03 MED FILL — TYLENOL 325 MG TABLET: 325 325 mg | ORAL | Qty: 2

## 2019-08-03 MED FILL — DIPHENHYDRAMINE 25 MG CAPSULE: 25 25 mg | ORAL | Qty: 1

## 2019-08-03 MED FILL — GAMMAGARD LIQUID 10 % INJECTION SOLUTION: 10 10 % | INTRAMUSCULAR | Qty: 250

## 2019-08-03 NOTE — Unmapped (Signed)
Addended by: [name] E on: 08/03/2019 11:41 AM     Modules accepted: Level of Service

## 2019-08-03 NOTE — Unmapped (Signed)
INTEGRATIVE MEDICINE NOTE  Infusion Suite     Patient:  Ashley Madden  MRN: [id number]  Date:  08/03/2019        [name] is a 86 y.o. female  was seen today by [facility].        Patient is Alert    Patient provided consent verbally.     Patient is is alone.        RATING YOUR SYMPTOM(S)  Pre or Current Symptom(s):    Pre Session Symptom(s):  Anxiety: 5 Moderate  Fatigue: 0 None  Nausea: 0 None  Pain: 0 None  Overall Distress Level:5 Moderate     Post Session Symptom(s):  Anxiety: 3 Mild  Fatigue: 0 None  Nausea: 0 None  Pain: 0 None  Overall Distress Level: 3 Mild      GOAL(S)  Goal(s) for Today  Education    Techniques Used Today:  Education    Patient able to assess goals met today? Yes    Goal(s) Met for Today:  Yes        Additional Therapist Comments: Dennie Bible says she is frustrated because of lingering fatigue from covid.... 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 Look to gratitude for what she can do ...Marland KitchenMarland KitchenMarland Kitchen she tanked me .. we did not do any breathing or relaxation  as her pulse was quite low when I walked in.. It moved up before I left.      Would you recommend this experience/services to other Infusion patients? Yes     Total Time Spent with Patient: 20 minutes.    Tavaris Eudy

## 2019-08-03 NOTE — Unmapped (Signed)
I Dr. Dionisio Paschal Tamara Kenyon saw Ashley Madden on 08/03/2019.    The following is my dictated note detailing the patient's symptoms, examination, assessment and plan that I performed during this visit    DIAGNOSES:  Left-sided breast cancer, T1 N0 M0, ER/PR, HER-2/neu negative, history of non-Hodgkin lymphoma, history of immunoglobulin deficiency on IVIG replacement, history of recent COVID infection with persistent shortness of breath.    HISTORY OF PRESENT ILLNESS:  Ashley Madden is here for followup.  I have not seen her for several months.  She is feeling better, but she developed COVID in the late fall.  She and her family were in [place], most of the family were affected.  She was in the hospital for a week or two.  She was on oxygen for several weeks.  She is off oxygen, but she has persistent shortness of breath.  She does not have cough.  No fevers.  No chills.  She just does not feel well.  She states she has a little to no energy compared to what she used to.  She did have a cardiac echo which was said to be unremarkable.  I do not have that for review.  By her history, she did not get a CAT scan at least nothing for the last several months and she has not had PFTs.  She otherwise has had no other additional intercurrent infection, GI, GU, pulmonary, cardiac, neurologic, musculoskeletal, cutaneous, endocrine, psychiatric, hematologic problems.    REVIEW OF SYSTEMS:  Remainder of a 10-point review of systems is unchanged.    PHYSICAL EXAMINATION:  GENERAL:  Alert, oriented, no acute distress, wearing her mask.  LUNGS:  Clear.  CARDIAC:  Unchanged.  LYMPHATIC:  No cervical, supra, or infraclavicular adenopathy.  CHEST WALL:  Was not examined.  ABDOMEN:  Soft.  Without ascites, jaundice, or organomegaly.  EXTREMITIES:  No Roneka Gilpin extremity edema.  SKIN:  Shows some solar damage.  MUSCULOSKELETAL:  Unchanged.  She has no signs of overt fracture.  NEUROLOGIC:  Grossly intact gait.  Negative.  She is not using an assist  device.  PSYCHIATRIC:  She is quite appropriate.    LABORATORY TESTS:  Reviewed.  Copy given to the patient.    IMPRESSION AND PLAN:    1. Stable thus far from malignancy aspect including from her breast cancer, non-Hodgkin lymphoma.  2. Labs reviewed.  3. 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  I have cautioned her that she did have some lung damage from radiation in the past and having COIVD has probably added more problems.  I suggested to her that we can get a CAT scan as well as pulmonary function test to get a sense of baseline.  We will try to do that.  She will be here in the [place] area for at least a few weeks.  She is aware she may need to see pulmonologist.  4. We did discuss IVIG continued replacement.  We will check her immunoglobulin levels.  They are followed closely in [place] as well.  5. The patient is overdue for mammogram which has been ordered.  6. The patient is overdue for DEXA scan, vitamin D level, both of these are ordered as well.  7. Other questions were addressed.  She will return as directed.          EEL/AQ  DD:  08/03/2019 11:40:26  DT:  08/04/2019 62:13:08    JOB#:  [id number]

## 2019-08-03 NOTE — Unmapped (Signed)
Ashley Madden receives ivig without incident.

## 2019-08-03 NOTE — Progress Notes (Signed)
See dictated note

## 2019-08-06 ENCOUNTER — Inpatient Hospital Stay: Admit: 2019-08-06 | Payer: MEDICARE | Attending: Hematology & Oncology

## 2019-08-06 DIAGNOSIS — C50919 Malignant neoplasm of unspecified site of unspecified female breast: Secondary | ICD-10-CM

## 2019-08-06 DIAGNOSIS — Z1231 Encounter for screening mammogram for malignant neoplasm of breast: Secondary | ICD-10-CM

## 2019-08-08 NOTE — Unmapped (Signed)
-----   Message from [name], MD sent at 08/07/2019  4:31 PM EDT -----    ----- Message -----  From: Interface, Results In  Sent: 08/06/2019   6:09 PM EDT  To: [name], MD

## 2019-08-08 NOTE — Unmapped (Signed)
Called patient to notify of test results. No answer. Left VM of Osteoporosis with contact info. for a return call for any questions or concerns.

## 2019-08-16 ENCOUNTER — Inpatient Hospital Stay: Admit: 2019-08-16 | Discharge: 2020-02-08 | Payer: MEDICARE | Attending: Hematology & Oncology

## 2019-08-16 ENCOUNTER — Inpatient Hospital Stay: Admit: 2019-08-16 | Payer: MEDICARE | Attending: Hematology & Oncology

## 2019-08-16 DIAGNOSIS — C50919 Malignant neoplasm of unspecified site of unspecified female breast: Secondary | ICD-10-CM

## 2019-08-16 LAB — PFT13-PULMONARY FUNCTION TEST
DLCO%: 69
DLCO: 13.43
FEV1%: 112
FEV1/FVC EXP: 72
FEV1/FVC: 79
FEV1: 1.68
FVC%: 104
FVC: 2.14
RESPONSE TO BRONCHODILATOR: 1
RV%: 86
RV: 1.94
TLC%: 94
TLC: 4.04
VC%: 103
VC: 2.11

## 2019-08-16 MED ORDER — OMNIPAQUE (iohexol) 350 mg iodine/mL 100 mL
350 | Freq: Once | INTRAVENOUS | Status: AC | PRN
Start: 2019-08-16 — End: 2019-08-16
  Administered 2019-08-16: 19:00:00 85 mL via INTRAVENOUS

## 2019-08-16 MED FILL — OMNIPAQUE 350 MG IODINE/ML INTRAVENOUS SOLUTION: 350 350 mg iodine/mL | INTRAVENOUS | Qty: 100

## 2019-08-23 NOTE — Unmapped (Signed)
Patient called wanting results of Dexa scan, Pulmonary function test, CT scan and stated that she got her results of the mammogram.  RN reviewed results and patient had paper copies of results with her.  Patient was asking about Dexa scan.  She stated that she takes calcium already.  RN discussed starting Fosamax and patient not agreeable at this time.  RN suggested for patient to discuss with Dr. Samara Snide at her next appointment in 4 weeks.

## 2019-09-14 ENCOUNTER — Ambulatory Visit: Admit: 2019-09-14 | Discharge: 2019-09-14 | Payer: MEDICARE | Attending: Hematology & Oncology

## 2019-09-14 ENCOUNTER — Ambulatory Visit: Admit: 2019-09-14 | Payer: MEDICARE

## 2019-09-14 DIAGNOSIS — D803 Selective deficiency of immunoglobulin G [IgG] subclasses: Secondary | ICD-10-CM

## 2019-09-14 DIAGNOSIS — C50919 Malignant neoplasm of unspecified site of unspecified female breast: Secondary | ICD-10-CM

## 2019-09-14 LAB — COMPREHENSIVE METABOLIC PANEL
ALT: 18 U/L (ref 7–52)
AST: 23 U/L (ref 13–39)
Albumin: 4.4 g/dL (ref 3.5–5.7)
Alkaline Phosphatase: 62 U/L (ref 36–125)
Anion Gap: 10 mmol/L (ref 3–16)
BUN: 24 mg/dL (ref 7–25)
CO2: 27 mmol/L (ref 21–33)
Calcium: 9.2 mg/dL (ref 8.6–10.3)
Chloride: 101 mmol/L (ref 98–110)
Creatinine: 0.86 mg/dL (ref 0.60–1.30)
Glucose: 97 mg/dL (ref 70–100)
Osmolality, Calculated: 290 mosm/kg (ref 278–305)
Potassium: 3.8 mmol/L (ref 3.5–5.3)
Sodium: 138 mmol/L (ref 133–146)
Total Bilirubin: 0.7 mg/dL (ref 0.0–1.5)
Total Protein: 6.8 g/dL (ref 6.4–8.9)
eGFR AA CKD-EPI: 70 See note.
eGFR NONAA CKD-EPI: 61 See note.

## 2019-09-14 LAB — CBC
Hematocrit: 38.1 % (ref 35.0–45.0)
Hemoglobin: 13.3 g/dL (ref 11.7–15.5)
MCH: 31 pg (ref 27.0–33.0)
MCHC: 35.1 g/dL (ref 32.0–36.0)
MCV: 88.4 fL (ref 80.0–100.0)
MPV: 9.9 fL (ref 7.5–11.5)
Platelets: 138 10E3/uL — ABNORMAL LOW (ref 140–400)
RBC: 4.3 10E6/uL (ref 3.80–5.10)
RDW: 14.5 % (ref 11.0–15.0)
WBC: 4.2 10E3/uL (ref 3.8–10.8)

## 2019-09-14 MED ORDER — sodium chloride flush 10 mL
Freq: Every day | INTRAMUSCULAR | Status: AC | PRN
Start: 2019-09-14 — End: 2019-09-15

## 2019-09-14 MED ORDER — heparin lock flush Syrg 500 Units
100 | Freq: Every day | INTRAVENOUS | Status: AC | PRN
Start: 2019-09-14 — End: 2019-09-15

## 2019-09-14 MED ORDER — diphenhydrAMINE (BENADRYL) injection 25 mg
50 | Freq: Every day | INTRAMUSCULAR | Status: AC | PRN
Start: 2019-09-14 — End: 2019-09-15

## 2019-09-14 MED ORDER — acetaminophen (TYLENOL) tablet 650 mg
325 | Freq: Once | ORAL | Status: AC
Start: 2019-09-14 — End: 2019-09-14
  Administered 2019-09-14: 21:00:00 650 mg via ORAL

## 2019-09-14 MED ORDER — albuterol (PROVENTIL) 90 mcg/actuation inhaler 1-2 puff
90 | RESPIRATORY_TRACT | Status: AC | PRN
Start: 2019-09-14 — End: 2019-09-15

## 2019-09-14 MED ORDER — immune globulin (human) (IgG) (GAMMAGARD) Soln 25 g
10 | Freq: Once | INTRAMUSCULAR | Status: AC
Start: 2019-09-14 — End: 2019-09-14
  Administered 2019-09-14: 21:00:00 25 g/kg via INTRAVENOUS

## 2019-09-14 MED ORDER — sodium chloride 0.9 % infusion
Freq: Every day | INTRAVENOUS | Status: AC | PRN
Start: 2019-09-14 — End: 2019-09-15

## 2019-09-14 MED ORDER — diphenhydrAMINE (BENADRYL) capsule 25 mg
25 | Freq: Once | ORAL | Status: AC
Start: 2019-09-14 — End: 2019-09-14
  Administered 2019-09-14: 21:00:00 25 mg via ORAL

## 2019-09-14 MED ORDER — hydrocortisone sod succ (PF) (SOLU-CORTEF) injection 100 mg
100 | Freq: Every day | INTRAMUSCULAR | Status: AC | PRN
Start: 2019-09-14 — End: 2019-09-15

## 2019-09-14 MED ORDER — EPINEPHrine (ADRENALIN) 1 mg/mL injection 0.3 mg
1 | Freq: Every day | INTRAMUSCULAR | Status: AC | PRN
Start: 2019-09-14 — End: 2019-09-15

## 2019-09-14 MED FILL — TYLENOL 325 MG TABLET: 325 325 mg | ORAL | Qty: 2

## 2019-09-14 MED FILL — GAMMAGARD LIQUID 10 % INJECTION SOLUTION: 10 10 % | INTRAMUSCULAR | Qty: 250

## 2019-09-14 MED FILL — DIPHENHYDRAMINE 25 MG CAPSULE: 25 25 mg | ORAL | Qty: 1

## 2019-09-14 NOTE — Unmapped (Signed)
I Dr. Dionisio Paschal Matsue Strom saw Sharion Estelle on 09/14/2019.    The following is my dictated note detailing the patient's symptoms, examination, assessment and plan that I performed during this visit    DIAGNOSES:  Left-sided breast cancer, T1 N0 M0, triple negative, history of non-Hodgkin lymphoma, history of positive COVID, history of immunoglobulin deficiency.    HISTORY OF PRESENT ILLNESS:  Ms. Dingus is here for followup, continuation of IVIG.  For the most part, she is doing pretty well.  She has had both of her vaccinations, but I encouraged her with her immunoglobulin deficiency that she should get boosted.  We gave her a note to help with this.  She is getting out.  She has been back and forth from [place] multiple times.  She is planning to have dinner with her grandchildren today.  I have cautioned her, her granddaughter is a Psychologist, occupational, etc, that she needs to be very cautious as the COVID cases have markedly increased over the last couple of weeks.  She is otherwise doing well.    REVIEW OF SYSTEMS:  No new aches or pains, cough, shortness of breath, signs of infection, GI, GU, pulmonary, cardiac, neurologic, musculoskeletal, cutaneous, endocrine, psychiatric, or hematologic problems.  Remainder of a 10-point review of systems is unchanged.    PHYSICAL EXAMINATION:  GENERAL:  Alert, oriented, in no acute distress.  Wearing her mask.  LUNGS:  Clear.  CARDIAC:  Unchanged.  LYMPHATIC:  No cervical, supra, or infraclavicular adenopathy.  BREAST:  Exam was not performed.  ABDOMEN:  Soft without ascites, jaundice, or organomegaly.  EXTREMITIES:  No Lucette Kratz extremity edema.  She is not clubbed.  SKIN:  Without rash or ecchymoses.  MUSCULOSKELETAL:  Unchanged.  GAIT:  Unchanged.  PSYCHIATRIC:  Unchanged.    LABORATORY DATA:  All per flow sheet.    IMPRESSION AND PLAN:    Overall doing well.  The patient will continue current observation. And IVIG  Copies of blood work given and reviewed.  She will continue IVIG.  We  went over COVID precautions particularly now that there is a Delta spike.  I have encouraged her to get boosted.  Immunoglobulins will be added to blood work.  She has opted not to go forward with annual mammograms, but does know it would be due in the fall.  Bone density reviewed.  It does show persistent osteoporosis.  We will check her vitamin D level around her next visit to make sure that is stable.    She will return as directed and will contact us for other additional problems.  Labs ordered.          Cynthia Cogle E. Breton Berns, MD      EEL/AQ  DD:  09/14/2019 16:57:44  DT:  09/15/2019 05:59:25    JOB#:  [id number]

## 2019-09-14 NOTE — Unmapped (Signed)
See dictated note

## 2019-09-21 NOTE — Telephone Encounter (Signed)
Pt called stating she keeps getting messages to go online to view results.     Pt stated she did not know how to do that.    Pt transferred to My [facility] help line

## 2019-09-21 NOTE — Unmapped (Signed)
Called and spoke with patient. Discussed lab results. Patient voiced understanding.     Jennifer L. Katrinka Blazing, BSN, RN  Clinic Nurse   [facility]

## 2019-10-18 MED ORDER — WIXELA INHUB 250-50 mcg/dose diskus inhaler
250-50 | RESPIRATORY_TRACT | 3 refills | Status: AC
Start: 2019-10-18 — End: 2020-06-10

## 2019-10-26 ENCOUNTER — Ambulatory Visit: Admit: 2019-10-26 | Discharge: 2019-10-26 | Payer: MEDICARE | Attending: Hematology & Oncology

## 2019-10-26 ENCOUNTER — Ambulatory Visit: Admit: 2019-10-26 | Payer: MEDICARE

## 2019-10-26 DIAGNOSIS — C50919 Malignant neoplasm of unspecified site of unspecified female breast: Secondary | ICD-10-CM

## 2019-10-26 DIAGNOSIS — D803 Selective deficiency of immunoglobulin G [IgG] subclasses: Secondary | ICD-10-CM

## 2019-10-26 LAB — COMPREHENSIVE METABOLIC PANEL
ALT: 17 U/L (ref 7–52)
AST: 31 U/L (ref 13–39)
Albumin: 4.4 g/dL (ref 3.5–5.7)
Alkaline Phosphatase: 53 U/L (ref 36–125)
Anion Gap: 5 mmol/L (ref 3–16)
BUN: 15 mg/dL (ref 7–25)
CO2: 32 mmol/L (ref 21–33)
Calcium: 10.1 mg/dL (ref 8.6–10.3)
Chloride: 100 mmol/L (ref 98–110)
Creatinine: 0.76 mg/dL (ref 0.60–1.30)
Glucose: 108 mg/dL (ref 70–100)
Osmolality, Calculated: 285 mOsm/kg (ref 278–305)
Potassium: 4.3 mmol/L (ref 3.5–5.3)
Sodium: 137 mmol/L (ref 133–146)
Total Bilirubin: 1.1 mg/dL (ref 0.0–1.5)
Total Protein: 6.9 g/dL (ref 6.4–8.9)
eGFR AA CKD-EPI: 82 See note.
eGFR NONAA CKD-EPI: 71 See note.

## 2019-10-26 LAB — DIFFERENTIAL
Basophils Absolute: 16 /uL (ref 0–200)
Basophils Relative: 0.4 % (ref 0.0–1.0)
Eosinophils Absolute: 43 /uL (ref 15–500)
Eosinophils Relative: 1.1 % (ref 0.0–8.0)
Lymphocytes Absolute: 538 /uL (ref 850–3900)
Lymphocytes Relative: 13.8 % (ref 15.0–45.0)
Monocytes Absolute: 655 /uL (ref 200–950)
Monocytes Relative: 16.8 % (ref 0.0–12.0)
Neutrophils Absolute: 2648 /uL (ref 1500–7800)
Neutrophils Relative: 67.9 % (ref 40.0–80.0)
nRBC: 0 /100 WBC (ref 0–0)

## 2019-10-26 LAB — CBC
Hematocrit: 38.9 % (ref 35.0–45.0)
Hemoglobin: 13.3 g/dL (ref 11.7–15.5)
MCH: 30.3 pg (ref 27.0–33.0)
MCHC: 34.3 g/dL (ref 32.0–36.0)
MCV: 88.4 fL (ref 80.0–100.0)
MPV: 9.8 fL (ref 7.5–11.5)
Platelets: 120 10*3/uL (ref 140–400)
RBC: 4.39 10*6/uL (ref 3.80–5.10)
RDW: 15.7 % (ref 11.0–15.0)
WBC: 3.9 10*3/uL (ref 3.8–10.8)

## 2019-10-26 LAB — IGA: IgA: 84 mg/dL (ref 90.0–410.0)

## 2019-10-26 LAB — IGM: IgM: 129.7 mg/dL (ref 30.0–360.0)

## 2019-10-26 LAB — KAPPA/LAMBDA, FREE LIGHT CHAINS, SERUM
Kappa/Lambda Ratio: 1.54 ratio (ref 0.26–1.65)
Kappa: 16 mg/L (ref 3.3–19.4)
Lambda: 10.4 mg/L (ref 5.7–26.3)

## 2019-10-26 LAB — IGG: IgG: 888 mg/dL (ref 600.0–1560.0)

## 2019-10-26 MED ORDER — immune globulin (human) (IgG) (GAMMAGARD) Soln 25 g
10 | Freq: Once | INTRAMUSCULAR | Status: AC
Start: 2019-10-26 — End: 2019-10-26
  Administered 2019-10-26: 17:00:00 25 g/kg via INTRAVENOUS

## 2019-10-26 MED ORDER — sodium chloride 0.9 % infusion
Freq: Every day | INTRAVENOUS | Status: AC | PRN
Start: 2019-10-26 — End: 2019-10-26
  Administered 2019-10-26: 16:00:00 25 mL/h via INTRAVENOUS

## 2019-10-26 MED ORDER — albuterol (PROVENTIL) 90 mcg/actuation inhaler 1-2 puff
90 | RESPIRATORY_TRACT | Status: AC | PRN
Start: 2019-10-26 — End: 2019-10-26

## 2019-10-26 MED ORDER — diphenhydrAMINE (BENADRYL) injection 25 mg
50 | Freq: Every day | INTRAMUSCULAR | Status: AC | PRN
Start: 2019-10-26 — End: 2019-10-26

## 2019-10-26 MED ORDER — diphenhydrAMINE (BENADRYL) capsule 25 mg
25 | Freq: Once | ORAL | Status: AC
Start: 2019-10-26 — End: 2019-10-26
  Administered 2019-10-26: 16:00:00 25 mg via ORAL

## 2019-10-26 MED ORDER — acetaminophen (TYLENOL) tablet 650 mg
325 | Freq: Once | ORAL | Status: AC
Start: 2019-10-26 — End: 2019-10-26
  Administered 2019-10-26: 16:00:00 650 mg via ORAL

## 2019-10-26 MED ORDER — sodium chloride flush 10 mL
Freq: Every day | INTRAMUSCULAR | Status: AC | PRN
Start: 2019-10-26 — End: 2019-10-26

## 2019-10-26 MED ORDER — heparin lock flush Syrg 500 Units
100 | Freq: Every day | INTRAVENOUS | Status: AC | PRN
Start: 2019-10-26 — End: 2019-10-26

## 2019-10-26 MED ORDER — hydrocortisone sod succ (PF) (SOLU-CORTEF) injection 100 mg
100 | Freq: Every day | INTRAMUSCULAR | Status: AC | PRN
Start: 2019-10-26 — End: 2019-10-26

## 2019-10-26 MED ORDER — EPINEPHrine (ADRENALIN) 1 mg/mL injection 0.3 mg
1 | Freq: Every day | INTRAMUSCULAR | Status: AC | PRN
Start: 2019-10-26 — End: 2019-10-26

## 2019-10-26 MED FILL — TYLENOL 325 MG TABLET: 325 325 mg | ORAL | Qty: 2

## 2019-10-26 MED FILL — DIPHENHYDRAMINE 25 MG CAPSULE: 25 25 mg | ORAL | Qty: 1

## 2019-10-26 MED FILL — GAMMAGARD LIQUID 10 % INJECTION SOLUTION: 10 10 % | INTRAMUSCULAR | Qty: 250

## 2019-10-26 NOTE — Unmapped (Signed)
Pt. here today after MD/NP appointment for IVIG.  Labs reviewed.  See MD/NP visit note for vital signs, allergy and medication review.  Pre-medications administered.  Pt tolerated treatment well with no adverse reactions.  Pt given AVS and next appointment reviewed.  Pt leaves treatment area ambulatory with no issues.

## 2019-10-26 NOTE — Unmapped (Signed)
I Dr. Dionisio Paschal Nayzeth Altman saw Ashley Madden on 10/26/2019.    The following is my dictated note detailing the patient's symptoms, examination, assessment and plan that I performed during this visit    DIAGNOSES:  Left-sided breast cancer, history of non-Hodgkin's lymphoma, and history of immunoglobulin deficiency, on IVIG.    HISTORY OF PRESENT ILLNESS:  Ashley Madden is here for IVIG.  For the most part, she is doing pretty well.  She just does not have the same stamina to get up and go that she had a few years ago.  She is now 60, and for the most part, is actually doing well.  She and her husband will be returning to [place] in the very near future to spend the winter.  She has had COVID vaccination.  She got flu shot today when she saw her primary care.  No other signs of infection, GI, GU, pulmonary, cardiac, neurologic, musculoskeletal, cutaneous, endocrine, psychiatric, or hematologic problems.  She has opted not to do mammograms, which seems reasonable.  If she changes her mind, she will let us know.  We are still following her DEXA scan and those results were reviewed with her and a Vitamin D level will be checked.    PHYSICAL EXAM:  GENERAL:  Alert, oriented, no acute distress, wearing her mask.  LUNGS:  Clear.  CARDIAC:  Unchanged.  LYMPHATIC:  No cervical, supra or infraclavicular adenopathy.  CHEST WALL:  Unchanged.  BREASTS:  Not examined today.  ABDOMEN:  Soft without ascites, jaundice, or organomegaly.  EXTREMITIES:  No Ashley Madden extremity edema.  She is not clubbed.  SKIN:  Without rash or ecchymoses.  MUSCULOSKELETAL:  Unchanged.  NEUROLOGIC:  Unchanged.  PSYCHIATRIC:  Good spirits.    LABS:  As noted.  Copy was given to the patient.    IMPRESSION AND PLAN:    1. Overall is doing fairly well.  She will continue IVIG.  Immunoglobulins have been requested.  2. DEXA scan and bone health issues are addressed.  Vitamin D level will be checked.  3. Post-COVID vaccine recommendations were addressed.  4. New labs were  ordered.  She will return as directed and will contact us for additional problems.          EEL/AQ  DD:  10/26/2019 11:50:42  DT:  10/27/2019 04:02:51    JOB#:  [id number]

## 2019-10-26 NOTE — Unmapped (Signed)
See dictated note

## 2019-11-15 NOTE — Unmapped (Signed)
Incoming call from [employer] office at [facility] requesting last office note and tx record. Patient has a follow up appt their on 12/07/19. Records faxed.  [name] RN, BSN, OCN  [facility]  [place]  [email or web address]

## 2020-05-26 NOTE — Unmapped (Signed)
Patient is a TOC from Dr. Samara Snide     She will need to an IVG Treatment before August -     Please call her at : [phone]    Thank you,

## 2020-05-26 NOTE — Unmapped (Signed)
Patient is a TOC from Dr. Samara Snide   ??  She will need to an IVG Treatment before August -   ??  Please call her at : [phone]  ??  Thank you,  ??

## 2020-05-26 NOTE — Unmapped (Signed)
Spoke with the patient and transferred to Nurse to discuss her treatment.

## 2020-05-26 NOTE — Unmapped (Signed)
Patient called regarding receiving IVIG. Patient stated she may or may not have received Dr. Chinita Greenland retirement Letter. Patient stated she's currently in [place] where she spends 6 months every year. Patient given the phone number for [facility] and stated she will give them a call.

## 2020-06-03 ENCOUNTER — Ambulatory Visit: Admit: 2020-06-03 | Payer: MEDICARE

## 2020-06-03 ENCOUNTER — Ambulatory Visit: Admit: 2020-06-03 | Discharge: 2020-06-03 | Payer: MEDICARE

## 2020-06-03 DIAGNOSIS — Z08 Encounter for follow-up examination after completed treatment for malignant neoplasm: Secondary | ICD-10-CM

## 2020-06-03 DIAGNOSIS — C50919 Malignant neoplasm of unspecified site of unspecified female breast: Secondary | ICD-10-CM

## 2020-06-03 LAB — CBC
Hematocrit: 35.7 % (ref 35.0–45.0)
Hemoglobin: 12.4 g/dL (ref 11.7–15.5)
MCH: 29.6 pg (ref 27.0–33.0)
MCHC: 34.7 g/dL (ref 32.0–36.0)
MCV: 85.3 fL (ref 80.0–100.0)
MPV: 9.4 fL (ref 7.5–11.5)
Platelets: 173 10*3/uL (ref 140–400)
RBC: 4.19 10*6/uL (ref 3.80–5.10)
RDW: 14.7 % (ref 11.0–15.0)
WBC: 3.9 10*3/uL (ref 3.8–10.8)

## 2020-06-03 LAB — COMPREHENSIVE METABOLIC PANEL
ALT: 15 U/L (ref 7–52)
AST: 19 U/L (ref 13–39)
Albumin: 4.2 g/dL (ref 3.5–5.7)
Alkaline Phosphatase: 66 U/L (ref 36–125)
Anion Gap: 10 mmol/L (ref 3–16)
BUN: 26 mg/dL (ref 7–25)
CO2: 30 mmol/L (ref 21–33)
Calcium: 9.6 mg/dL (ref 8.6–10.3)
Chloride: 98 mmol/L (ref 98–110)
Creatinine: 0.95 mg/dL (ref 0.60–1.30)
EGFR: 58
Glucose: 97 mg/dL (ref 70–100)
Osmolality, Calculated: 291 mOsm/kg (ref 278–305)
Potassium: 3.6 mmol/L (ref 3.5–5.3)
Sodium: 138 mmol/L (ref 133–146)
Total Bilirubin: 0.7 mg/dL (ref 0.0–1.5)
Total Protein: 6.8 g/dL (ref 6.4–8.9)

## 2020-06-03 LAB — DIFFERENTIAL
Basophils Absolute: 12 /uL (ref 0–200)
Basophils Relative: 0.3 % (ref 0.0–1.0)
Eosinophils Absolute: 27 /uL (ref 15–500)
Eosinophils Relative: 0.7 % (ref 0.0–8.0)
Lymphocytes Absolute: 433 /uL (ref 850–3900)
Lymphocytes Relative: 11.1 % (ref 15.0–45.0)
Monocytes Absolute: 835 /uL (ref 200–950)
Monocytes Relative: 21.4 % (ref 0.0–12.0)
Neutrophils Absolute: 2594 /uL (ref 1500–7800)
Neutrophils Relative: 66.5 % (ref 40.0–80.0)
nRBC: 0 /100 WBC (ref 0–0)

## 2020-06-03 NOTE — Unmapped (Signed)
Transfer of Care  DIAGNOSES:  Left-sided breast cancer, history of non-Hodgkin's lymphoma, and history of immunoglobulin deficiency, on IVIG.  ??  Treatment:  Left breast cancer 1998 s/p Left mastectomy and chemotherapy  Lymphoma-?2004-2009-Rituxan-?  IVIG    History of Present Illness  Here to establish care with Dr. Barnett Hatter.     She has remote history of Stage 1 breast cancer (do not see original type) in late 90s treated with surgery and chemotherapy as well as a non-hodgkin lymphoma in early 2000's which she stated she got treatment for.     She has been on IVIG for hypogammaglobulinemia.     She lives in [place] for 6 months of the year and [place] for 6 months of the year.     Interval History  Notes some progressive fatigue since being diagnosed with Covid prior. She also has had some increasing falls as of late while at home which sounds to be stemming from some generalized balance disorders. She notes she has a change in the appearance of her right hip over last month without pain. Also having some abdominal pain with chronic diarrrhea.     No recent sinopulmonary infections. No recent antibiotics. No dysuria.     Otherwise going to exercise classes       Histories Interval Hsitory-05/17  She has a past medical history of Breast cancer (CMS Dx) and Immunoglobulin deficiency (CMS Dx) (07/08/2011).    She has a past surgical history that includes Breast biopsy.    Her family history includes Breast Cancer (age of onset: 21) in her sister.    She reports that she has never smoked. She has never used smokeless tobacco. She reports current alcohol use. She reports that she does not use drugs.    Allergies  Patient has no known allergies.    Medications  Current Outpatient Medications   Medication Sig   ??? aspirin Take 81 mg by mouth daily.   ??? atenoloL Take 1 tablet (50 mg total) by mouth daily.   ??? atorvastatin Take 1 tablet by mouth daily   ??? calcipotriene Apply twice daily to involved areas for 5 days   ??? calcium  citrate/vitamin D3 (CITRACAL + D ORAL) Take 100 mg by mouth daily.   ??? dicyclomine Take 1 capsule (10 mg total) by mouth if needed. Take one capsule by mouth every 6 hours as needed for abdominal cramping   ??? hydroCHLOROthiazide Take 25 mg by mouth daily.   ??? levothyroxine Take 100 mcg by mouth daily.   ??? multivit-iron-min-folic acid Chew 1 tablet by mouth daily.   ??? psyllium Take 1 packet by mouth daily.   ??? traZODone TK 1 T PO  QHS PRF INSOMNIA   ??? Wixela Inhub INHALE 1 PUFF INTO THE LUNGS TWICE DAILY     No current facility-administered medications for this visit.     The following portions of the patient's history were reviewed and updated as appropriate: allergies, current medications, past family history, past medical history, past social history, past surgical history and problem list.    Review of Systems   Constitutional: Positive for activity change. Negative for fatigue and fever.   HENT: Negative for facial swelling.    Respiratory: Negative for cough, chest tightness, shortness of breath and wheezing.    Cardiovascular: Negative for chest pain and leg swelling.   Gastrointestinal: Positive for abdominal pain. Negative for blood in stool, constipation, diarrhea, nausea and vomiting.   Genitourinary: Negative for difficulty urinating, dyspareunia  and dysuria.   Musculoskeletal: Negative for arthralgias, back pain and neck pain.        Right hip skin contraction/change in appearance   Skin: Negative for color change and rash.   Neurological: Negative for dizziness.   Hematological: Negative for adenopathy.   Psychiatric/Behavioral: Negative for agitation and behavioral problems.     Physical Exam  Constitutional:       Appearance: Normal appearance.      Comments: Appears healthy and spry for age   HENT:      Right Ear: External ear normal.      Left Ear: External ear normal.      Mouth/Throat:      Mouth: Mucous membranes are moist.   Eyes:      General: No scleral icterus.     Extraocular Movements:  Extraocular movements intact.      Pupils: Pupils are equal, round, and reactive to light.   Cardiovascular:      Rate and Rhythm: Normal rate and regular rhythm.      Pulses: Normal pulses.      Heart sounds: Normal heart sounds.   Pulmonary:      Effort: Pulmonary effort is normal.      Breath sounds: Normal breath sounds.   Abdominal:      General: Abdomen is flat.      Palpations: Abdomen is soft.      Comments: Mild tenderness to midline epigastrum and suprapubic area   Musculoskeletal:         General: No swelling.      Comments: Right lateral hip with contraction of skin distal to greater trochanter; no pain to palpation, abduction and extension intact; some mild imbalance when weight bearing on right leg   Skin:     General: Skin is warm.      Coloration: Skin is not jaundiced.   Neurological:      General: No focal deficit present.      Mental Status: She is alert and oriented to person, place, and time.   Psychiatric:         Mood and Affect: Mood normal.         Behavior: Behavior normal.        Neurologic Exam     Mental Status   Oriented to person, place, and time.     Cranial Nerves     CN III, IV, VI   Pupils are equal, round, and reactive to light.    Review of Lab Results  Lab Results   Component Value Date    WBC 3.9 10/26/2019    HGB 13.3 10/26/2019    HCT 38.9 10/26/2019    PLT 120 (L) 10/26/2019    CREATININE 0.76 10/26/2019    BUN 15 10/26/2019    PROT 6.9 10/26/2019    AST 31 10/26/2019    ALT 17 10/26/2019    BILITOT 1.1 10/26/2019    CALCIUM 10.1 10/26/2019    MG 1.9 09/01/2018    LDH 188 06/20/2017     Imaging   Requesting CT scans from [facility] of right hip    Investigations Reviewed:   DXA scan: severe osteoporosis of LUE    Cancer Staging:  Cancer Staging  Malignant neoplasm of female breast (CMS Dx)  Staging form: Breast, AJCC 6th Edition  - Clinical: No stage assigned - Unsigned  - Pathologic: Stage I (T1, N0, M0) - Signed by [name], MD on 03/25/2015  Assessment &  Plan  #Hx stage I breast cancer s/p mastectomy and chemotherapy(late 90s)  -no clinical evidence of recurrence; upcoming mammogram scheduled    #Osteoporosis  -repeat DXA in late July and likely start bisphosphonate given T-score -4 on UE especially with recent falls    #Hx NHL, remote  Unclear subtype; given     #Hx hypogammaglobulinemia  Been on chronic IVIG supplementation  Will hold infusion today and check levels in a month; if still >500 may be able to space out/wean off infusions    #Right lateral hip morphologic changes  Hip appears dysmorphic though generally nontender and with decent range of motion. She has been falling more lately and perhaps she has some muscle tendon rupture (ie tensor fascia latae) that is destabilizing her. Will request recent imaging from [facility] to review and she may need MRI if CT inconclusive     Check Ig levels in 4 weeks; fu in 5 weeks to review levels and discuss imaging from [facility]  Hematology/Oncology Fellow, PGY-6  [employer]  Pager: [phone]    I have seen and independently examined the patient, I discussed patient care with the fellow , I have edited the note where necessary, and I agree with the assessment and plan with the following additions:    87 year old lady with a remote history of early stage left breast cancer with no clinical evidence of local or systemic recurrence   History of NHL with no clinical evidence of recurrence   Hypogammaglobulinemia on monthly replacement therapy , we will reevaluate for the need , skip one infusion and repeat immunoglobulin levels     Henrique Parekh M.D.

## 2020-06-11 MED ORDER — fluticasone propion-salmeteroL (WIXELA INHUB) 250-50 mcg/dose
250-50 | Freq: Two times a day (BID) | RESPIRATORY_TRACT | 3 refills | Status: AC
Start: 2020-06-11 — End: ?

## 2020-07-04 NOTE — Unmapped (Signed)
Spoke w/ patients daughter. She confirmed appointment time change.

## 2020-07-08 ENCOUNTER — Ambulatory Visit: Admit: 2020-07-08 | Payer: MEDICARE

## 2020-07-08 ENCOUNTER — Ambulatory Visit: Admit: 2020-07-08 | Discharge: 2020-07-08 | Payer: MEDICARE

## 2020-07-08 ENCOUNTER — Ambulatory Visit: Payer: MEDICARE

## 2020-07-08 DIAGNOSIS — D809 Immunodeficiency with predominantly antibody defects, unspecified: Secondary | ICD-10-CM

## 2020-07-08 DIAGNOSIS — C50919 Malignant neoplasm of unspecified site of unspecified female breast: Secondary | ICD-10-CM

## 2020-07-08 LAB — DIFFERENTIAL
Basophils Absolute: 6 /uL (ref 0–200)
Basophils Relative: 0.2 % (ref 0.0–1.0)
Eosinophils Absolute: 42 /uL (ref 15–500)
Eosinophils Relative: 1.4 % (ref 0.0–8.0)
Lymphocytes Absolute: 474 /uL (ref 850–3900)
Lymphocytes Relative: 15.8 % (ref 15.0–45.0)
Monocytes Absolute: 876 /uL (ref 200–950)
Monocytes Relative: 29.2 % (ref 0.0–12.0)
Neutrophils Absolute: 1602 /uL (ref 1500–7800)
Neutrophils Relative: 53.4 % (ref 40.0–80.0)
nRBC: 0 /100 WBC (ref 0–0)

## 2020-07-08 LAB — KAPPA/LAMBDA, FREE LIGHT CHAINS, SERUM
Kappa/Lambda Ratio: 1.67 ratio (ref 0.26–1.65)
Kappa: 21.2 mg/L (ref 3.3–19.4)
Lambda: 12.7 mg/L (ref 5.7–26.3)

## 2020-07-08 LAB — FERRITIN: Ferritin: 184.7 ng/mL (ref 11.0–306.8)

## 2020-07-08 LAB — IGA: IgA: 101 mg/dL (ref 90.0–410.0)

## 2020-07-08 LAB — CBC
Hematocrit: 30.8 % (ref 35.0–45.0)
Hemoglobin: 10.8 g/dL (ref 11.7–15.5)
MCH: 29.7 pg (ref 27.0–33.0)
MCHC: 35.1 g/dL (ref 32.0–36.0)
MCV: 84.5 fL (ref 80.0–100.0)
MPV: 8.8 fL (ref 7.5–11.5)
Platelets: 189 10*3/uL (ref 140–400)
RBC: 3.64 10*6/uL (ref 3.80–5.10)
RDW: 16.2 % (ref 11.0–15.0)
WBC: 3 10*3/uL (ref 3.8–10.8)

## 2020-07-08 LAB — IRON STUDIES
% Iron Saturation: 16.8 % (ref 15.0–55.0)
Iron: 46 ug/dL (ref 50–212)
TIBC: 274 ug/dL (ref 265–497)

## 2020-07-08 LAB — COMPREHENSIVE METABOLIC PANEL
ALT: 19 U/L (ref 7–52)
AST: 23 U/L (ref 13–39)
Albumin: 4.1 g/dL (ref 3.5–5.7)
Alkaline Phosphatase: 83 U/L (ref 36–125)
Anion Gap: 8 mmol/L (ref 3–16)
BUN: 11 mg/dL (ref 7–25)
CO2: 31 mmol/L (ref 21–33)
Calcium: 10 mg/dL (ref 8.6–10.3)
Chloride: 96 mmol/L (ref 98–110)
Creatinine: 0.73 mg/dL (ref 0.60–1.30)
EGFR: 79
Glucose: 94 mg/dL (ref 70–100)
Osmolality, Calculated: 279 mOsm/kg (ref 278–305)
Potassium: 4.1 mmol/L (ref 3.5–5.3)
Sodium: 135 mmol/L (ref 133–146)
Total Bilirubin: 0.9 mg/dL (ref 0.0–1.5)
Total Protein: 6.9 g/dL (ref 6.4–8.9)

## 2020-07-08 LAB — IGG: IgG: 708 mg/dL (ref 600.0–1560.0)

## 2020-07-08 LAB — IGM: IgM: 138.8 mg/dL (ref 30.0–360.0)

## 2020-07-08 NOTE — Unmapped (Signed)
Ashley Madden not getting treated.

## 2020-07-08 NOTE — Unmapped (Signed)
Transfer of Care  DIAGNOSES:  Left-sided breast cancer, history of non-Hodgkin's lymphoma, and history of immunoglobulin deficiency, on IVIG.  ??  Treatment:  Left breast cancer 1998 s/p Left mastectomy and chemotherapy  Lymphoma-?2004-2009-Rituxan-?  IVIG    History of Present Illness  Here for follow up    She has remote history of Stage 1 breast cancer (do not see original type) in late 90s treated with surgery and chemotherapy as well as a non-hodgkin lymphoma in early 2000's which she stated she got treatment for.     She has been on IVIG for hypogammaglobulinemia.     She lives in [place] for 6 months of the year and [place] for 6 months of the year.     Interval History  Larey Seat last month and was hospitalized with compression fx of spine and has fractures ribs.  Still wearing rib/back brace  Having GI issues constipation alternating with diarrhea- working with her PCP  No recent infections- still ok without IVIG.       Histories Interval Hsitory-05/17  She has a past medical history of Breast cancer (CMS Dx) and Immunoglobulin deficiency (CMS Dx) (07/08/2011).    She has a past surgical history that includes Breast biopsy.    Her family history includes Breast Cancer (age of onset: 15) in her sister.    She reports that she has never smoked. She has never used smokeless tobacco. She reports current alcohol use. She reports that she does not use drugs.    Allergies  Patient has no known allergies.    Medications  Current Outpatient Medications   Medication Sig   ??? acetaminophen Take 500 mg by mouth every 6 hours as needed.   ??? aspirin Take 81 mg by mouth daily.   ??? atenoloL Take 1 tablet (50 mg total) by mouth daily.   ??? atorvastatin Take 1 tablet by mouth daily   ??? calcium citrate/vitamin D3 (CITRACAL + D ORAL) Take 100 mg by mouth daily.   ??? dicyclomine Take 1 capsule (10 mg total) by mouth if needed. Take one capsule by mouth every 6 hours as needed for abdominal cramping   ??? gabapentin Take 100 mg by mouth 3  times a day.   ??? levothyroxine Take 100 mcg by mouth daily.   ??? linaCLOtide 145 mcg.   ??? multivit-iron-min-folic acid Chew 1 tablet by mouth daily.   ??? psyllium Take 1 packet by mouth daily.   ??? traMADoL Take 50 mg by mouth every 6 hours as needed for Pain.   ??? traZODone TK 1 T PO  QHS PRF INSOMNIA   ??? calcipotriene Apply twice daily to involved areas for 5 days   ??? fluticasone propion-salmeteroL Inhale 1 puff into the lungs 2 times a day.   ??? hydroCHLOROthiazide Take 25 mg by mouth daily.     No current facility-administered medications for this visit.     The following portions of the patient's history were reviewed and updated as appropriate: allergies, current medications, past family history, past medical history, past social history, past surgical history and problem list.    Review of Systems   Constitutional: Positive for activity change. Negative for fatigue and fever.   HENT: Negative for facial swelling.    Respiratory: Negative for cough, chest tightness, shortness of breath and wheezing.    Cardiovascular: Negative for chest pain and leg swelling.   Gastrointestinal: Positive for abdominal pain. Negative for blood in stool, constipation, diarrhea, nausea and vomiting.  Genitourinary: Negative for difficulty urinating, dyspareunia and dysuria.   Musculoskeletal: Negative for arthralgias, back pain and neck pain.        Right hip skin contraction/change in appearance   Skin: Negative for color change and rash.   Neurological: Negative for dizziness.   Hematological: Negative for adenopathy.   Psychiatric/Behavioral: Negative for agitation and behavioral problems.     Physical Exam  Constitutional:       Appearance: Normal appearance.      Comments: Appears healthy and spry for age   HENT:      Mouth/Throat:      Mouth: Mucous membranes are moist.   Eyes:      General: No scleral icterus.     Conjunctiva/sclera: Conjunctivae normal.   Cardiovascular:      Rate and Rhythm: Normal rate and regular rhythm.       Pulses: Normal pulses.      Heart sounds: Normal heart sounds.   Pulmonary:      Effort: Pulmonary effort is normal. No respiratory distress.   Abdominal:      General: Abdomen is flat. There is no distension.      Palpations: Abdomen is soft.   Musculoskeletal:         General: No swelling.      Right lower leg: No edema.      Left lower leg: No edema.      Comments: Wearing back/rib brace   Skin:     General: Skin is warm.      Coloration: Skin is not jaundiced.   Neurological:      General: No focal deficit present.      Mental Status: She is alert and oriented to person, place, and time.   Psychiatric:         Mood and Affect: Mood normal.         Behavior: Behavior normal.        Neurologic Exam     Mental Status   Oriented to person, place, and time.     Review of Lab Results  Lab Results   Component Value Date    WBC 3.0 (L) 07/08/2020    HGB 10.8 (L) 07/08/2020    HCT 30.8 (L) 07/08/2020    PLT 189 07/08/2020    CREATININE 0.73 07/08/2020    BUN 11 07/08/2020    PROT 6.9 07/08/2020    AST 23 07/08/2020    ALT 19 07/08/2020    BILITOT 0.9 07/08/2020    CALCIUM 10.0 07/08/2020    MG 1.9 09/01/2018    LDH 188 06/20/2017     Imaging   Requesting CT scans from [facility] of right hip    Investigations Reviewed:   DXA scan: severe osteoporosis of LUE    Cancer Staging:  Cancer Staging  Malignant neoplasm of female breast (CMS Dx)  Staging form: Breast, AJCC 6th Edition  - Clinical: No stage assigned - Unsigned  - Pathologic: Stage I (T1, N0, M0) - Signed by [name], MD on 03/25/2015      Assessment & Plan  #Hx stage I breast cancer s/p mastectomy and chemotherapy(late 90s)  -no clinical evidence of recurrence; upcoming mammogram scheduled    #Osteoporosis  -repeat DXA in late July and likely start bisphosphonate given T-score -4 on UE especially with recent falls and now fracture of Tspine and ribs.    # Anemia  Iron studies ordered- hgb down 1 point since last visit. PCP is working up  GI issues-  will update him with labs from today.    #Hx NHL, remote  Unclear subtype; given     #Hx hypogammaglobulinemia  Been on chronic IVIG supplementation  Will hold infusion today- last levels above 500. If remain > 500 continue to space out intervals.    #Right lateral hip morphologic changes  Hip appears dysmorphic though generally nontender and with decent range of motion. She has been falling more lately and perhaps she has some muscle tendon rupture (ie tensor fascia latae) that is destabilizing her. Will request recent imaging from [facility] to review and she may need MRI if CT inconclusive- unchanged today- not addressed     Check Ig levels in 6 weeks; fu in 8 weeks to review levels and discuss imaging from [facility] and her recent hospitalization for fractures post fall.

## 2020-07-11 ENCOUNTER — Encounter

## 2020-07-11 ENCOUNTER — Ambulatory Visit: Payer: MEDICARE | Attending: Hematology & Oncology

## 2020-07-14 MED ORDER — ferrous sulfate 325 (65 FE) MG tablet
325 | ORAL_TABLET | ORAL | 0 refills | Status: AC
Start: 2020-07-14 — End: ?

## 2020-07-14 NOTE — Unmapped (Signed)
Information received and given to NP.

## 2020-07-14 NOTE — Unmapped (Signed)
Request faxed to both Facilities.

## 2020-08-11 ENCOUNTER — Inpatient Hospital Stay: Admit: 2020-08-11 | Payer: MEDICARE

## 2020-08-11 ENCOUNTER — Encounter

## 2020-08-11 ENCOUNTER — Ambulatory Visit: Payer: MEDICARE

## 2020-08-11 DIAGNOSIS — M816 Localized osteoporosis [Lequesne]: Secondary | ICD-10-CM

## 2020-09-09 ENCOUNTER — Ambulatory Visit: Payer: MEDICARE

## 2020-09-11 ENCOUNTER — Ambulatory Visit: Admit: 2020-09-11 | Payer: MEDICARE

## 2020-09-11 ENCOUNTER — Ambulatory Visit: Admit: 2020-09-11 | Discharge: 2020-09-11 | Payer: MEDICARE

## 2020-09-11 DIAGNOSIS — D809 Immunodeficiency with predominantly antibody defects, unspecified: Secondary | ICD-10-CM

## 2020-09-11 DIAGNOSIS — C50919 Malignant neoplasm of unspecified site of unspecified female breast: Secondary | ICD-10-CM

## 2020-09-11 LAB — COMPREHENSIVE METABOLIC PANEL
ALT: 18 U/L (ref 7–52)
AST: 22 U/L (ref 13–39)
Albumin: 4.3 g/dL (ref 3.5–5.7)
Alkaline Phosphatase: 74 U/L (ref 36–125)
Anion Gap: 5 mmol/L (ref 3–16)
BUN: 25 mg/dL (ref 7–25)
CO2: 32 mmol/L (ref 21–33)
Calcium: 9.5 mg/dL (ref 8.6–10.3)
Chloride: 103 mmol/L (ref 98–110)
Creatinine: 0.74 mg/dL (ref 0.60–1.30)
EGFR: 78
Glucose: 99 mg/dL (ref 70–100)
Osmolality, Calculated: 294 mOsm/kg (ref 278–305)
Potassium: 3.8 mmol/L (ref 3.5–5.3)
Sodium: 140 mmol/L (ref 133–146)
Total Bilirubin: 0.8 mg/dL (ref 0.0–1.5)
Total Protein: 6.7 g/dL (ref 6.4–8.9)

## 2020-09-11 LAB — CBC
Hematocrit: 37.4 % (ref 35.0–45.0)
Hemoglobin: 12.6 g/dL (ref 11.7–15.5)
MCH: 29.9 pg (ref 27.0–33.0)
MCHC: 33.6 g/dL (ref 32.0–36.0)
MCV: 89 fL (ref 80.0–100.0)
MPV: 9.5 fL (ref 7.5–11.5)
Platelets: 139 10*3/uL (ref 140–400)
RBC: 4.21 10*6/uL (ref 3.80–5.10)
RDW: 15.9 % (ref 11.0–15.0)
WBC: 4.4 10*3/uL (ref 3.8–10.8)

## 2020-09-11 LAB — KAPPA/LAMBDA, FREE LIGHT CHAINS, SERUM
Kappa/Lambda Ratio: 1.83 ratio (ref 0.26–1.65)
Kappa: 19.4 mg/L (ref 3.3–19.4)
Lambda: 10.6 mg/L (ref 5.7–26.3)

## 2020-09-11 LAB — DIFFERENTIAL
Basophils Absolute: 18 /uL (ref 0–200)
Basophils Relative: 0.4 % (ref 0.0–1.0)
Eosinophils Absolute: 75 /uL (ref 15–500)
Eosinophils Relative: 1.7 % (ref 0.0–8.0)
Lymphocytes Absolute: 867 /uL (ref 850–3900)
Lymphocytes Relative: 19.7 % (ref 15.0–45.0)
Monocytes Absolute: 946 /uL (ref 200–950)
Monocytes Relative: 21.5 % (ref 0.0–12.0)
Neutrophils Absolute: 2495 /uL (ref 1500–7800)
Neutrophils Relative: 56.7 % (ref 40.0–80.0)
nRBC: 0 /100 WBC (ref 0–0)

## 2020-09-11 LAB — IGA: IgA: 88 mg/dL (ref 90.0–410.0)

## 2020-09-11 LAB — IGM: IgM: 145.4 mg/dL (ref 30.0–360.0)

## 2020-09-11 LAB — IGG: IgG: 674 mg/dL (ref 600.0–1560.0)

## 2020-09-11 NOTE — Unmapped (Signed)
tx held

## 2020-09-11 NOTE — Unmapped (Signed)
Transfer of Care  DIAGNOSES:  Left-sided breast cancer, history of non-Hodgkin's lymphoma, and history of immunoglobulin deficiency, on IVIG.  ??  Treatment:  Left breast cancer 1998 s/p Left mastectomy and chemotherapy  Lymphoma-?2004-2009-Rituxan-?  IVIG    History of Present Illness  Here for follow up    She has remote history of Stage 1 breast cancer (do not see original type) in late 90s treated with surgery and chemotherapy as well as a non-hodgkin lymphoma in early 2000's which she stated she got treatment for.     She has been on IVIG for hypogammaglobulinemia.     She lives in [place] for 6 months of the year and [place] for 6 months of the year.     Interval History  Better improving after her hospitalization  Colonoscopy ok  Still sob- but improved with PT  Pain better  Still has some GI issues but improved  No recent infections  Draw QIG today-evaluate if she needs infusion. If >500 no infusion       Histories Interval Hsitory-05/17  She has a past medical history of Breast cancer (CMS Dx) and Immunoglobulin deficiency (CMS Dx) (07/08/2011).    She has a past surgical history that includes Breast biopsy.    Her family history includes Breast Cancer (age of onset: 67) in her sister.    She reports that she has never smoked. She has never used smokeless tobacco. She reports current alcohol use. She reports that she does not use drugs.    Allergies  Patient has no known allergies.    Medications  Current Outpatient Medications   Medication Sig   ??? acetaminophen Take 500 mg by mouth every 6 hours as needed.   ??? aspirin Take 81 mg by mouth daily.   ??? atenoloL Take 1 tablet (50 mg total) by mouth daily.   ??? atorvastatin Take 1 tablet by mouth daily   ??? calcipotriene Apply twice daily to involved areas for 5 days   ??? calcium citrate/vitamin D3 (CITRACAL + D ORAL) Take 100 mg by mouth daily.   ??? dicyclomine Take 1 capsule (10 mg total) by mouth if needed. Take one capsule by mouth every 6 hours as needed for  abdominal cramping   ??? ferrous sulfate Take 1 tablet (325 mg total) by mouth every other day.   ??? fluticasone propion-salmeteroL Inhale 1 puff into the lungs 2 times a day.   ??? gabapentin Take 100 mg by mouth 3 times a day.   ??? levothyroxine Take 100 mcg by mouth daily.   ??? linaCLOtide 145 mcg.   ??? multivit-iron-min-folic acid Chew 1 tablet by mouth daily.   ??? psyllium Take 1 packet by mouth daily.   ??? traMADoL Take 50 mg by mouth every 6 hours as needed for Pain.   ??? traZODone TK 1 T PO  QHS PRF INSOMNIA     No current facility-administered medications for this visit.     The following portions of the patient's history were reviewed and updated as appropriate: allergies, current medications, past family history, past medical history, past social history, past surgical history and problem list.    Review of Systems   Constitutional: Positive for activity change and fatigue (improving). Negative for fever.   HENT: Negative for facial swelling.    Respiratory: Negative for cough, chest tightness, shortness of breath and wheezing.    Cardiovascular: Negative for chest pain and leg swelling.   Gastrointestinal: Positive for abdominal pain. Negative for blood  in stool, constipation, diarrhea, nausea and vomiting.   Genitourinary: Negative for difficulty urinating, dyspareunia and dysuria.   Musculoskeletal: Negative for arthralgias, back pain and neck pain.   Skin: Negative for color change and rash.   Neurological: Negative for dizziness.   Hematological: Negative for adenopathy.   Psychiatric/Behavioral: Negative for agitation and behavioral problems.     Physical Exam  Constitutional:       Appearance: Normal appearance.      Comments: Appears healthy and spry for age   HENT:      Mouth/Throat:      Mouth: Mucous membranes are moist.   Eyes:      General: No scleral icterus.     Conjunctiva/sclera: Conjunctivae normal.   Cardiovascular:      Rate and Rhythm: Normal rate and regular rhythm.      Heart sounds: Normal  heart sounds.   Pulmonary:      Effort: Pulmonary effort is normal. No respiratory distress.      Breath sounds: Normal breath sounds.   Abdominal:      General: Abdomen is flat. There is no distension.      Palpations: Abdomen is soft.   Musculoskeletal:      Right lower leg: No edema.      Left lower leg: No edema.   Skin:     General: Skin is warm and dry.      Coloration: Skin is not jaundiced.   Neurological:      General: No focal deficit present.      Mental Status: She is alert and oriented to person, place, and time.   Psychiatric:         Mood and Affect: Mood normal.         Behavior: Behavior normal.         Thought Content: Thought content normal.         Judgment: Judgment normal.        Neurologic Exam     Mental Status   Oriented to person, place, and time.     Review of Lab Results  Lab Results   Component Value Date    WBC 3.0 (L) 07/08/2020    HGB 10.8 (L) 07/08/2020    HCT 30.8 (L) 07/08/2020    PLT 189 07/08/2020    CREATININE 0.73 07/08/2020    BUN 11 07/08/2020    PROT 6.9 07/08/2020    AST 23 07/08/2020    ALT 19 07/08/2020    BILITOT 0.9 07/08/2020    CALCIUM 10.0 07/08/2020    MG 1.9 09/01/2018    LDH 188 06/20/2017     Imaging   Requesting CT scans from [facility] of right hip    Investigations Reviewed:   DXA scan: severe osteoporosis of LUE    Cancer Staging:  Cancer Staging  Malignant neoplasm of female breast (CMS Dx)  Staging form: Breast, AJCC 6th Edition  - Clinical: No stage assigned - Unsigned  - Pathologic: Stage I (T1, N0, M0) - Signed by [name], MD on 03/25/2015      Assessment & Plan  #Hx stage I breast cancer s/p mastectomy and chemotherapy(late 90s)  -no clinical evidence of recurrence; upcoming mammogram scheduled    #Osteoporosis  -repeat DXA in late July- improved- Osteopenia Lowest Tscore -1.2    # Anemia  Recent colonoscopy- work up through PCP- stable    #Hx NHL, remote  Unclear subtype; given     #Hx hypogammaglobulinemia  Been  on chronic IVIG  supplementation  Will hold infusion today- last levels above 500. If remain > 500 continue to space out intervals.    #Right lateral hip morphologic changes  Hip appears dysmorphic though generally nontender and with decent range of motion. She has been falling more lately and perhaps she has some muscle tendon rupture (ie tensor fascia latae) that is destabilizing her. Will request recent imaging from [facility] to review and she may need MRI if CT inconclusive- unchanged today- not addressed     Check Ig levels in 8 weeks  RTC 8 weeks with Dr Barnett Hatter

## 2020-09-15 NOTE — Unmapped (Signed)
Ashley Shiver, NP  Colin Ina, RN  Please call- her IgG levels are stable will check again at her next appt with Dr Barnett Hatter       ??     Mailbox full unable to leave message. Home number rings like a fax machine. Message sent via my chart.  [name] RN, BSN, OCN  [facility]  [place]  [name]

## 2020-09-15 NOTE — Unmapped (Signed)
Patient contacted office to inquire on if she needs IVIG. See previous encounter.  [name] RN, BSN, OCN  [facility]  [place]  [email or web address]

## 2020-10-07 NOTE — Unmapped (Signed)
Patient states she leaves to go out of town before 11/06/20 for the winter is asking to be seen prior to this date - Please advise

## 2020-10-08 NOTE — Unmapped (Signed)
Spoke with the patient and appointment rescheduled.

## 2020-10-09 ENCOUNTER — Encounter

## 2020-10-30 ENCOUNTER — Ambulatory Visit: Admit: 2020-10-30 | Payer: MEDICARE

## 2020-10-30 DIAGNOSIS — C50919 Malignant neoplasm of unspecified site of unspecified female breast: Secondary | ICD-10-CM

## 2020-10-30 DIAGNOSIS — D803 Selective deficiency of immunoglobulin G [IgG] subclasses: Secondary | ICD-10-CM

## 2020-10-30 LAB — CBC
Hematocrit: 38.8 % (ref 35.0–45.0)
Hemoglobin: 13.1 g/dL (ref 11.7–15.5)
MCH: 30 pg (ref 27.0–33.0)
MCHC: 33.9 g/dL (ref 32.0–36.0)
MCV: 88.4 fL (ref 80.0–100.0)
MPV: 10.1 fL (ref 7.5–11.5)
Platelets: 105 10*3/uL (ref 140–400)
RBC: 4.38 10*6/uL (ref 3.80–5.10)
RDW: 14.7 % (ref 11.0–15.0)
WBC: 2.6 10*3/uL (ref 3.8–10.8)

## 2020-10-30 LAB — DIFFERENTIAL
Basophils Absolute: 5 /uL (ref 0–200)
Basophils Relative: 0.2 % (ref 0.0–1.0)
Eosinophils Absolute: 34 /uL (ref 15–500)
Eosinophils Relative: 1.3 % (ref 0.0–8.0)
Lymphocytes Absolute: 491 /uL — ABNORMAL LOW (ref 850–3900)
Lymphocytes Relative: 18.9 % (ref 15.0–45.0)
Monocytes Absolute: 533 /uL (ref 200–950)
Monocytes Relative: 20.5 % — ABNORMAL HIGH (ref 0.0–12.0)
Neutrophils Absolute: 1537 /uL (ref 1500–7800)
Neutrophils Relative: 59.1 % (ref 40.0–80.0)
nRBC: 0 /100{WBCs} (ref 0–0)

## 2020-10-30 LAB — COMPREHENSIVE METABOLIC PANEL
ALT: 20 U/L (ref 7–52)
AST: 22 U/L (ref 13–39)
Albumin: 4.5 g/dL (ref 3.5–5.7)
Alkaline Phosphatase: 74 U/L (ref 36–125)
Anion Gap: 8 mmol/L (ref 3–16)
BUN: 28 mg/dL — ABNORMAL HIGH (ref 7–25)
CO2: 30 mmol/L (ref 21–33)
Calcium: 9.4 mg/dL (ref 8.6–10.3)
Chloride: 102 mmol/L (ref 98–110)
Creatinine: 0.79 mg/dL (ref 0.60–1.30)
EGFR: 72
Glucose: 67 mg/dL — ABNORMAL LOW (ref 70–100)
Osmolality, Calculated: 294 mosm/kg (ref 278–305)
Potassium: 3.8 mmol/L (ref 3.5–5.3)
Sodium: 140 mmol/L (ref 133–146)
Total Bilirubin: 0.7 mg/dL (ref 0.0–1.5)
Total Protein: 7.1 g/dL (ref 6.4–8.9)

## 2020-10-30 LAB — IGG: IgG: 693 mg/dL (ref 600.0–1560.0)

## 2020-10-30 LAB — IGA: IgA: 97 mg/dL (ref 90.0–410.0)

## 2020-10-30 LAB — IGM: IgM: 157.9 mg/dL (ref 30.0–360.0)

## 2020-10-30 LAB — KAPPA/LAMBDA, FREE LIGHT CHAINS, SERUM
Kappa/Lambda Ratio: 1.92 ratio (ref 0.26–1.65)
Kappa: 23 mg/L (ref 3.3–19.4)
Lambda: 12 mg/L (ref 5.7–26.3)

## 2020-10-30 MED ORDER — immune globulin (human) (IgG) (GAMMAGARD) Soln 25 g
10 | Freq: Once | INTRAMUSCULAR | Status: AC
Start: 2020-10-30 — End: 2020-10-30
  Administered 2020-10-30: 17:00:00 25 g/kg via INTRAVENOUS

## 2020-10-30 MED ORDER — diphenhydrAMINE (BENADRYL) injection 25 mg
50 | Freq: Every day | INTRAMUSCULAR | Status: AC | PRN
Start: 2020-10-30 — End: 2020-10-30

## 2020-10-30 MED ORDER — heparin lock flush Syrg 500 Units
100 | Freq: Every day | INTRAVENOUS | Status: AC | PRN
Start: 2020-10-30 — End: 2020-10-30

## 2020-10-30 MED ORDER — acetaminophen (TYLENOL) tablet 650 mg
325 | Freq: Once | ORAL | Status: AC
Start: 2020-10-30 — End: 2020-10-30
  Administered 2020-10-30: 17:00:00 650 mg via ORAL

## 2020-10-30 MED ORDER — sodium chloride flush 10 mL
Freq: Every day | INTRAMUSCULAR | Status: AC | PRN
Start: 2020-10-30 — End: 2020-10-30

## 2020-10-30 MED ORDER — hydrocortisone sod succ (PF) (SOLU-CORTEF) injection 100 mg
100 | Freq: Every day | INTRAMUSCULAR | Status: AC | PRN
Start: 2020-10-30 — End: 2020-10-30

## 2020-10-30 MED ORDER — diphenhydrAMINE (BENADRYL) capsule 25 mg
25 | Freq: Once | ORAL | Status: AC
Start: 2020-10-30 — End: 2020-10-30
  Administered 2020-10-30: 17:00:00 25 mg via ORAL

## 2020-10-30 MED ORDER — sodium chloride 0.9 % infusion
Freq: Every day | INTRAVENOUS | Status: AC | PRN
Start: 2020-10-30 — End: 2020-10-30
  Administered 2020-10-30: 17:00:00 25 mL/h via INTRAVENOUS

## 2020-10-30 MED ORDER — EPINEPHrine 1 mg/mL injection
1 | Freq: Every day | INTRAMUSCULAR | Status: AC | PRN
Start: 2020-10-30 — End: 2020-10-30

## 2020-10-30 MED ORDER — albuterol (PROVENTIL) 90 mcg/actuation inhaler 1-2 puff
90 | RESPIRATORY_TRACT | Status: AC | PRN
Start: 2020-10-30 — End: 2020-10-30

## 2020-10-30 MED FILL — GAMMAGARD LIQUID 10 % INJECTION SOLUTION: 10 10 % | INTRAMUSCULAR | Qty: 250

## 2020-10-30 MED FILL — TYLENOL 325 MG TABLET: 325 325 mg | ORAL | Qty: 2

## 2020-10-30 MED FILL — DIPHENHYDRAMINE 25 MG CAPSULE: 25 25 mg | ORAL | Qty: 1

## 2020-10-30 NOTE — Unmapped (Signed)
Pt. here today after MD/NP appointment for IVIG.  Labs reviewed.  vital signs, allergy and medication review.  Pre-medications administered.  Pt tolerated treatment well with no adverse reactions.  Pt given AVS and next appointment reviewed.  Pt leaves treatment area ambulatory with no issues.

## 2020-10-30 NOTE — Unmapped (Signed)
Transfer of Care  DIAGNOSES:  Left-sided breast cancer, history of non-Hodgkin's lymphoma, and history of immunoglobulin deficiency, on IVIG.  ??  Treatment:  Left breast cancer 1998 s/p Left mastectomy and chemotherapy  Lymphoma-?2004-2009-Rituxan-?  IVIG    History of Present Illness  Here for follow up    She has remote history of Stage 1 breast cancer (do not see original type) in late 90s treated with surgery and chemotherapy as well as a non-hodgkin lymphoma in early 2000's which she stated she got treatment for.     She has been on IVIG for hypogammaglobulinemia.     She lives in [place] for 6 months of the year and [place] for 6 months of the year.     Interval History: 10/13  Better improving after her hospitalization  Colonoscopy ok  Still sob- but improved with PT  Abdominal pain better- bowels functioning better  Still has some GI issues but improved  No recent infections  Draw QIG today-  Planning to leave for [facility] for 6 months in next 1-2 weeks  IVIG today     Histories Interval Hsitory-05/17  She has a past medical history of Breast cancer (CMS Dx) and Immunoglobulin deficiency (CMS Dx) (07/08/2011).    She has a past surgical history that includes Breast biopsy.    Her family history includes Breast Cancer (age of onset: 85) in her sister.    She reports that she has never smoked. She has never used smokeless tobacco. She reports current alcohol use. She reports that she does not use drugs.    Allergies  Patient has no known allergies.    Medications  Current Outpatient Medications   Medication Sig   ??? acetaminophen Take 500 mg by mouth every 6 hours as needed.   ??? aspirin Take 81 mg by mouth daily.   ??? atenoloL Take 1 tablet (50 mg total) by mouth daily.   ??? atorvastatin Take 1 tablet by mouth daily   ??? calcipotriene Apply twice daily to involved areas for 5 days   ??? calcium citrate/vitamin D3 (CITRACAL + D ORAL) Take 100 mg by mouth daily.   ??? dicyclomine Take 1 capsule (10 mg total) by mouth if needed.  Take one capsule by mouth every 6 hours as needed for abdominal cramping   ??? ferrous sulfate Take 1 tablet (325 mg total) by mouth every other day.   ??? fluticasone propion-salmeteroL Inhale 1 puff into the lungs 2 times a day.   ??? fluticasone propion-salmeteroL Inhale 1 puff into the lungs 2 times a day.   ??? gabapentin Take 100 mg by mouth 3 times a day.   ??? levothyroxine Take 100 mcg by mouth daily.   ??? linaCLOtide 145 mcg.   ??? multivit-iron-min-folic acid Chew 1 tablet by mouth daily.   ??? psyllium Take 1 packet by mouth daily.   ??? traMADoL Take 50 mg by mouth every 6 hours as needed for Pain.   ??? traZODone TK 1 T PO  QHS PRF INSOMNIA     No current facility-administered medications for this visit.     The following portions of the patient's history were reviewed and updated as appropriate: allergies, current medications, past family history, past medical history, past social history, past surgical history and problem list.    Review of Systems   Constitutional: Positive for fatigue (improving). Negative for activity change and fever.   HENT: Negative for facial swelling.    Respiratory: Negative for cough, chest tightness,  shortness of breath and wheezing.    Cardiovascular: Negative for chest pain and leg swelling.   Gastrointestinal: Negative for abdominal pain, blood in stool, constipation, diarrhea, nausea and vomiting.   Genitourinary: Negative for difficulty urinating, dyspareunia and dysuria.   Musculoskeletal: Negative for arthralgias, back pain and neck pain.   Skin: Negative for color change and rash.   Neurological: Negative for dizziness.   Hematological: Negative for adenopathy.   Psychiatric/Behavioral: Negative for agitation and behavioral problems.     Physical Exam  Constitutional:       General: She is not in acute distress.     Appearance: Normal appearance. She is well-developed.      Comments: Appears healthy and spry for age   HENT:      Head: Normocephalic.      Mouth/Throat:      Mouth:  Mucous membranes are moist.      Pharynx: Uvula midline.   Eyes:      Conjunctiva/sclera: Conjunctivae normal.   Neck:      Trachea: No tracheal deviation.   Cardiovascular:      Rate and Rhythm: Normal rate and regular rhythm.   Pulmonary:      Effort: Pulmonary effort is normal. No respiratory distress.      Breath sounds: Normal breath sounds. No wheezing or rales.   Abdominal:      General: Abdomen is flat. There is no distension.      Palpations: Abdomen is soft.   Musculoskeletal:         General: No tenderness. Normal range of motion.      Cervical back: Neck supple.      Right lower leg: No edema.      Left lower leg: No edema.   Lymphadenopathy:      Cervical: No cervical adenopathy.      Upper Body:      Right upper body: No supraclavicular adenopathy.      Left upper body: No supraclavicular adenopathy.   Skin:     General: Skin is warm and dry.      Coloration: Skin is not jaundiced.      Findings: No bruising, erythema or rash.      Nails: There is no clubbing.   Neurological:      General: No focal deficit present.      Mental Status: She is alert and oriented to person, place, and time.      Coordination: Coordination normal.   Psychiatric:         Mood and Affect: Mood normal.         Behavior: Behavior normal.         Thought Content: Thought content normal.         Judgment: Judgment normal.        Neurologic Exam     Mental Status   Oriented to person, place, and time.     Review of Lab Results  Lab Results   Component Value Date    WBC 2.6 (L) 10/30/2020    HGB 13.1 10/30/2020    HCT 38.8 10/30/2020    PLT 105 (L) 10/30/2020    CREATININE 0.74 09/11/2020    BUN 25 09/11/2020    PROT 6.7 09/11/2020    AST 22 09/11/2020    ALT 18 09/11/2020    BILITOT 0.8 09/11/2020    CALCIUM 9.5 09/11/2020    MG 1.9 09/01/2018    LDH 188 06/20/2017     Imaging  Investigations Reviewed:   DXA scan: severe osteoporosis of LUE    Cancer Staging:  Cancer Staging  Malignant neoplasm of female breast (CMS  Dx)  Staging form: Breast, AJCC 6th Edition  - Clinical: No stage assigned - Unsigned  - Pathologic: Stage I (T1, N0, M0) - Signed by [name], MD on 03/25/2015      Assessment & Plan  #Hx stage I breast cancer s/p mastectomy and chemotherapy(late 90s)  -no clinical evidence of recurrence; upcoming mammogram scheduled    #Osteoporosis  -repeat DXA in late July- improved- Osteopenia Lowest Tscore -1.2    # Anemia  Recent colonoscopy- work up through PCP- stable    #Hx NHL, remote  Unclear subtype; given     #Hx hypogammaglobulinemia  Been on chronic IVIG supplementation  Will give IVIG today as she will be in [facility] next 6 months- would like to get a dose before she goes  -Will send message to her [facility] provider with updates    #Right lateral hip morphologic changes  Hip appears dysmorphic though generally nontender and with decent range of motion. She has been falling more lately and perhaps she has some muscle tendon rupture (ie tensor fascia latae) that is destabilizing her. Will request recent imaging from [facility] to review and she may need MRI if CT inconclusive- unchanged today- not addressed     RTC 6 months or on return to [place]    I have seen and examined the patient with the NP, I have edited the note as necessary, and I agree with the assessment and plan with the following additions:    87 year old lady with a remote history of left breast cancer, treated with mastectomy and chemotherapy with no evidence of recurrence.  History of non-Hodgkin lymphoma lymphoma, treated close to 20 years ago.  Hypogammaglobulinemia, on IVIG will we will continue    Carlyon Shadow MD

## 2020-11-06 ENCOUNTER — Ambulatory Visit: Payer: MEDICARE

## 2020-11-06 ENCOUNTER — Encounter

## 2021-06-22 NOTE — Unmapped (Signed)
Pt calling in to sch follow up with Dr Barnett Hatter.    Said she should be sch for first week of July.  First available appt was 8/10, added for this date.    Pt asking for a call back to move appt up to beginning of July, [phone].

## 2021-08-06 ENCOUNTER — Ambulatory Visit: Payer: MEDICARE

## 2021-08-06 NOTE — Unmapped (Signed)
Spoke with the patient and she is rescheduled.

## 2021-08-06 NOTE — Unmapped (Signed)
This pt called the scheduling line this morning to cancel her appointment for today. She just found out this morning that she is covid positive. The soonest r/s I could find was in September. Could someone in the office call her back to get her in sometime in August?   Thanks,  [facility]

## 2021-08-27 ENCOUNTER — Ambulatory Visit: Payer: MEDICARE

## 2021-08-31 NOTE — Unmapped (Signed)
Called pt to schedule spoke with husband pt will call back.

## 2021-09-01 NOTE — Unmapped (Signed)
Lmvm for pt to return call to get scheduled for mohs.

## 2021-09-09 ENCOUNTER — Ambulatory Visit: Payer: MEDICARE

## 2021-09-16 ENCOUNTER — Ambulatory Visit: Admit: 2021-09-16 | Discharge: 2021-09-22 | Payer: MEDICARE

## 2021-09-16 DIAGNOSIS — C44729 Squamous cell carcinoma of skin of left lower limb, including hip: Secondary | ICD-10-CM

## 2021-09-16 NOTE — Unmapped (Signed)
Mohs surgery operative note    Indications for Mohs Micrographic Surgery: Tumor Location, Tumor Size, and Tissue Site    Signed informed consent was obtained.    I reiterated the risks of bleeding, infection, scar, sensory nerve damage resulting in numbness, motor nerve damage resulting in loss of functional muscles, and recurrence.     Premedication: none  Post op medication: none  Surgeon:   Marvene Staff, DO  Technician: Minette Brine   Type of Tumor: SCC  Lesion: primary  Location: left posterior ankle  Preoperative Size: 3 X 2.6   Postoperative Size: 3.5 cm X 2.9 cm   Anesthesia: 1% Lidocaine with Epinephrine 1/200000  Anesthesia Amount: 2 ml    Operation:   Surgical excision of cutaneous malignancy with continuous microscopic control (Mohs Micrographic Surgery).   Preparation: Prior biopsy on the above site was confirmed and pathology report obtained.  The patient confirmed the location of the biopsy site by looking into a mirror and pointing to the location.  The area of clinically apparent tumor was outlined using a surgical pen to define the margin.  The operative site was surgically prepped with betadine.  The area was then draped with a sterile drape and locally anesthetized.  This was done for each surgical stage of the procedure.     The lesion was removed by Mohs surgery, fresh-tissue technique in 2 stage with 1 section per stage. Stage 1(positive), stage 2(negative).  All tissue, which was removed from the lesion site, was examined by frozen section technique with detailed mappings of the tissue and with Dr. Marvene Staff reading the slides in the manner of Mohs chemosurgery or microscopically controlled excision.  At the completion of the surgery, the deep and peripheral margins were free of tumor.     Repair:  Various closure modalities were discussed with the patient, and it was decided that a simple pursestring would be best to preserve normal anatomic and functional relationships. Additional risk  of wound dehiscence was discussed.     The patient was placed on the operating room table. The area was anesthetized with Lidocaine 1% with epinephrine with added sodium bicarbonate, was given a sterile prep using betadine, and draped in the usual sterile fashion. Recreation and enlargement of the wound was performed by excising cones of tissue via the triangulation technique.    4- 0 vicryl was ran in the dermis. The length of the final incision line was 3 cm.        A pressure dressing was placed. Wound care reviewed and written instructions were provided. Call for any issues.         Patient instructed to RTC at any point for concerns including but not limited to: scar appearance, nerve damage, bleeding, signs of recurrence.

## 2021-09-16 NOTE — Unmapped (Signed)
Patient Consultation Questionnaire   Site of skin cancer: Left posterior ankle  Histologic diagnosis: SCC  How long ago did you notice this lesion? A year  Symptoms and signs of the skin cancer we are treating: pain and scabbing  Duration of symptoms: 1 year  Have you had any prior treatment to this lesion? Yes;   When: LN2   By Whom: Dermatologist in [place]  Have you had any other skin cancers? Yes;   Where: multiple areas   How long ago: multiple years  Are you on any blood thinners? Yes;   Taking: aspirin   How Often: daily  Do you have any artificial joints or heart valves? Yes;   Type: left knee and left hip 5 years ago  Do you have a pacemaker or defibrillator?  No

## 2021-09-17 ENCOUNTER — Ambulatory Visit: Admit: 2021-09-17 | Discharge: 2021-09-17 | Payer: MEDICARE

## 2021-09-17 ENCOUNTER — Ambulatory Visit: Admit: 2021-09-17 | Payer: MEDICARE

## 2021-09-17 DIAGNOSIS — D809 Immunodeficiency with predominantly antibody defects, unspecified: Secondary | ICD-10-CM

## 2021-09-17 DIAGNOSIS — Z08 Encounter for follow-up examination after completed treatment for malignant neoplasm: Secondary | ICD-10-CM

## 2021-09-17 DIAGNOSIS — C50919 Malignant neoplasm of unspecified site of unspecified female breast: Secondary | ICD-10-CM

## 2021-09-17 LAB — DIFFERENTIAL
Basophils Absolute: 15 /uL (ref 0–200)
Basophils Relative: 0.4 % (ref 0.0–1.0)
Eosinophils Absolute: 56 /uL (ref 15–500)
Eosinophils Relative: 1.5 % (ref 0.0–8.0)
Lymphocytes Absolute: 481 /uL (ref 850–3900)
Lymphocytes Relative: 13 % (ref 15.0–45.0)
Monocytes Absolute: 1032 /uL (ref 200–950)
Monocytes Relative: 27.9 % (ref 0.0–12.0)
Neutrophils Absolute: 2116 /uL (ref 1500–7800)
Neutrophils Relative: 57.2 % (ref 40.0–80.0)
nRBC: 0 /100 WBC (ref 0–0)

## 2021-09-17 LAB — CBC
Hematocrit: 34.7 % (ref 35.0–45.0)
Hemoglobin: 11.9 g/dL (ref 11.7–15.5)
MCH: 30.5 pg (ref 27.0–33.0)
MCHC: 34.3 g/dL (ref 32.0–36.0)
MCV: 88.9 fL (ref 80.0–100.0)
MPV: 9.2 fL (ref 7.5–11.5)
Platelets: 115 10*3/uL (ref 140–400)
RBC: 3.9 10*6/uL (ref 3.80–5.10)
RDW: 14.8 % (ref 11.0–15.0)
WBC: 3.5 10*3/uL (ref 3.8–10.8)

## 2021-09-17 LAB — COMPREHENSIVE METABOLIC PANEL
ALT: 13 U/L (ref 7–52)
AST: 18 U/L (ref 13–39)
Albumin: 4.4 g/dL (ref 3.5–5.7)
Alkaline Phosphatase: 60 U/L (ref 36–125)
Anion Gap: 8 mmol/L (ref 3–16)
BUN: 22 mg/dL (ref 7–25)
CO2: 30 mmol/L (ref 21–33)
Calcium: 9.8 mg/dL (ref 8.6–10.3)
Chloride: 101 mmol/L (ref 98–110)
Creatinine: 0.77 mg/dL (ref 0.60–1.30)
EGFR: 74
Glucose: 112 mg/dL (ref 70–100)
Osmolality, Calculated: 292 mOsm/kg (ref 278–305)
Potassium: 3.7 mmol/L (ref 3.5–5.3)
Sodium: 139 mmol/L (ref 133–146)
Total Bilirubin: 0.9 mg/dL (ref 0.0–1.5)
Total Protein: 6.8 g/dL (ref 6.4–8.9)

## 2021-09-17 LAB — IGG: IgG: 810 mg/dL (ref 600.0–1560.0)

## 2021-09-17 LAB — IGA: IgA: 106 mg/dL (ref 90.0–410.0)

## 2021-09-17 LAB — IGM: IgM: 158.4 mg/dL (ref 30.0–360.0)

## 2021-09-17 NOTE — Unmapped (Signed)
Transfer of Care  DIAGNOSES:  Left-sided breast cancer, history of non-Hodgkin's lymphoma, and history of immunoglobulin deficiency, on IVIG.     Treatment:  Left breast cancer 1998 s/p Left mastectomy and chemotherapy  Lymphoma-?2004-2009-Rituxan-?  IVIG    History of Present Illness  Here for follow up    She has remote history of Stage 1 breast cancer (do not see original type) in late 90s treated with surgery and chemotherapy as well as a non-hodgkin lymphoma in early 2000's which she stated she got treatment for.     She has been on IVIG for hypogammaglobulinemia.     She lives in [place] for 6 months of the year and [place] for 6 months of the year.     Interval History:   09/17/21 follow up  Broken right arm - stress fx   Surgery on L leg - cancerous growth removed   UTI - currently being treated  Leaving for [place] in Oct   Staying active   Feeling ok overall  Immunoglobulin labs - normal, does not need IVIG today  Denies any new headaches, fever, cough SOB, CP, new bone pain, change in bowel or bladder habits     Histories Interval Hsitory-05/17  She has a past medical history of Breast cancer (CMS-HCC) and Immunoglobulin deficiency (CMS-HCC) (07/08/2011).    She has a past surgical history that includes Breast biopsy.    Her family history includes Breast Cancer (age of onset: 36) in her sister.    She reports that she has never smoked. She has never used smokeless tobacco. She reports current alcohol use. She reports that she does not use drugs.    Allergies  Patient has no known allergies.    Medications  Current Outpatient Medications   Medication Sig    acetaminophen Take 1 tablet (500 mg total) by mouth every 6 hours as needed.    aspirin Take 1 tablet (81 mg total) by mouth daily.    atenoloL Take 1 tablet (50 mg total) by mouth daily.    atorvastatin Take 1 tablet by mouth daily    calcium citrate/vitamin D3 (CITRACAL + D ORAL) Take 100 mg by mouth daily.    dicyclomine Take 1 capsule (10 mg total) by mouth  if needed. Take one capsule by mouth every 6 hours as needed for abdominal cramping    fluticasone propion-salmeteroL Inhale 1 puff into the lungs 2 times a day.    hydroCHLOROthiazide Take 1 tablet (25 mg total) by mouth daily.    levothyroxine Take 1 tablet (88 mcg total) by mouth every morning before breakfast.    mometasone APPLY EVERY DAY AS NEEDED FOR INVOLVED LEG RAS    multivit-iron-min-folic acid Chew 1 tablet by mouth daily.    psyllium Take 1 packet by mouth daily.    calcipotriene Apply twice daily to involved areas for 5 days    cyanocobalamin Take 1 tablet (1,000 mcg total) by mouth daily. Indications: under the tongue    HYDROcodone-acetaminophen Take 1 tablet by mouth every 6 hours as needed for Pain.     No current facility-administered medications for this visit.     The following portions of the patient's history were reviewed and updated as appropriate: allergies, current medications, past family history, past medical history, past social history, past surgical history and problem list.    Review of Systems   Constitutional:  Positive for fatigue (improving). Negative for activity change and fever.   HENT:  Negative for facial  swelling.    Respiratory:  Negative for cough, chest tightness, shortness of breath and wheezing.    Cardiovascular:  Negative for chest pain and leg swelling.   Gastrointestinal:  Negative for abdominal pain, blood in stool, constipation, diarrhea, nausea and vomiting.   Genitourinary:  Negative for difficulty urinating, dyspareunia and dysuria.   Musculoskeletal:  Negative for arthralgias, back pain and neck pain.   Skin:  Negative for color change and rash.   Neurological:  Negative for dizziness.   Hematological:  Negative for adenopathy.   Psychiatric/Behavioral:  Negative for agitation and behavioral problems.      Physical Exam  Vitals reviewed.  Constitutional: Oriented to person, place, and time. Appears well-developed and well-nourished. No distress.   HENT:    Head: Normocephalic and atraumatic.    Eyes: Conjunctivae and EOM are normal.    Cardiovascular: Normal rate, regular rhythm.  Pulmonary/Chest: Effort normal.  Abdominal: Exhibits no distension and no masses.  Musculoskeletal: Exhibits no edema.  Lymphatics: No cervical adenopathy.   Neurological: Alert and oriented to person, place, and time.  Skin: No rash noted. No erythema.   Psychiatric: Has a normal mood and affect. Behavior is normal. Judgment and thought content normal.    Brace on R forearm for fracture     Review of Lab Results  Lab Results   Component Value Date    WBC 3.5 (L) 09/17/2021    HGB 11.9 09/17/2021    HCT 34.7 (L) 09/17/2021    PLT 115 (L) 09/17/2021    CREATININE 0.77 09/17/2021    BUN 22 09/17/2021    PROT 6.8 09/17/2021    AST 18 09/17/2021    ALT 13 09/17/2021    BILITOT 0.9 09/17/2021    CALCIUM 9.8 09/17/2021    MG 1.9 09/01/2018    LDH 188 06/20/2017     Imaging     Investigations Reviewed:   DXA scan: severe osteoporosis of LUE    Cancer Staging:   Cancer Staging   Malignant neoplasm of female breast (CMS-HCC)  Staging form: Breast, AJCC 6th Edition  - Clinical: No stage assigned - Unsigned  - Pathologic: Stage I (T1, N0, M0) - Signed by [name], MD on 03/25/2015      Assessment & Plan  #Hx stage I breast cancer s/p mastectomy and chemotherapy(late 90s)  -no clinical evidence of recurrence; upcoming mammogram scheduled    #Osteoporosis  -repeat DXA in late July- improved- Osteopenia Lowest Tscore -1.2    # Anemia  Recent colonoscopy- work up through PCP- stable    #Hx NHL, remote  Unclear subtype; given     #Hx hypogammaglobulinemia  Been on chronic IVIG supplementation  Immunoglobulin levels are normal today - she is asx  Does not need infusion     #Right lateral hip morphologic changes  Hip appears dysmorphic though generally nontender and with decent range of motion. She has been falling more lately and perhaps she has some muscle tendon rupture (ie tensor fascia latae) that is  destabilizing her. Will request recent imaging from [facility] to review and she may need MRI if CT inconclusive- unchanged today- not addressed     RTC annually    Signed by [facility] Keylin Ferryman PA on 09/18/2021 at 2:59 PM

## 2021-09-17 NOTE — Unmapped (Signed)
I have seen and independently examined the patient, I discussed patient care with the PA , I have edited the note where necessary, and I agree with the assessment and plan with the following additions:    88 year old lady with history of left breast cancer treated with mastectomy and chemotherapy.  History of non-Hodgkin's lymphoma treated with chemotherapy more than 20 years ago.  History of hypogammaglobulinemia, was on IVIG replacement, last year levels were normal, IVIG on hold.  We will repeat immunoglobulin levels today if normal she does not need to continue the infusion.  Labs reviewed: CBC and chemistry profile are normal except for mild leukopenia    Immunoglobulin levels are normal . No need for IVIG     [facility] M.D.

## 2021-09-30 ENCOUNTER — Institutional Professional Consult (permissible substitution): Admit: 2021-09-30 | Discharge: 2021-09-30 | Payer: MEDICARE

## 2021-09-30 DIAGNOSIS — Z5189 Encounter for other specified aftercare: Secondary | ICD-10-CM

## 2021-09-30 NOTE — Unmapped (Signed)
Pt was seen for wound check L calf. No signs of infection. Re bandaged patient and scheduled to follow-up in 5 weeks.

## 2021-10-30 ENCOUNTER — Ambulatory Visit
Admit: 2021-10-30 | Discharge: 2021-11-03 | Payer: MEDICARE | Attending: Student in an Organized Health Care Education/Training Program

## 2021-10-30 DIAGNOSIS — C44729 Squamous cell carcinoma of skin of left lower limb, including hip: Secondary | ICD-10-CM

## 2021-10-30 NOTE — Unmapped (Signed)
Dermatologic Surgery Clinic Note    Subjective: The patient is a 88 y.o. female who presents today for a wound check after recent dermatologic surgery. No concerns for infection today. The patient continues with daily wound cares as recommended.    Patient reports uncomplicated healing in interim. Denies significant pain. She continues with daily wound care.    No other associated symptoms, modifying factors, or prior treatments, except when noted above. The patient denies any constitutional symptoms, lymphadenopathy, unintentional weight loss or decreased appetite. No other skin concerns today.    Objective:   Gen: This is a well appearing individual in no acute distress. The patient is alert and oriented x 3.  An exam of the L lower extremity was performed today and visualized the following:  - Ovoid violaceous scar with small (>1 cm) central focus of granulation tissue  - The surgical site noted above is clean, dry, and intact. There is no surrounding erythema, purulence, or significant tenderness to palpation. No clinical evidence of infection noted today.    Assessment and Plan:     SCC, L posterior ankle, s/p MMS 09/16/21  - The patient's surgery site(s) is/are healing very well. No evidence of infection on examination today.  - The patient was told to continue with wound cares until the area(s) is/are no longer crusted.   - The patient should follow up with dermatologic surgery PRN, as well as continue with regular skin exams in general dermatology clinic.    Esau Grew, MD  Mohs micrographic surgeon

## 2022-09-07 ENCOUNTER — Inpatient Hospital Stay: Admit: 2022-09-07

## 2022-09-14 ENCOUNTER — Ambulatory Visit: Payer: MEDICARE

## 2022-09-14 ENCOUNTER — Ambulatory Visit: Admit: 2022-09-14 | Discharge: 2022-09-22 | Payer: MEDICARE

## 2022-09-14 ENCOUNTER — Ambulatory Visit: Admit: 2022-09-14 | Discharge: 2022-09-21 | Payer: MEDICARE

## 2022-09-14 DIAGNOSIS — M47812 Spondylosis without myelopathy or radiculopathy, cervical region: Secondary | ICD-10-CM

## 2022-09-14 DIAGNOSIS — M542 Cervicalgia: Secondary | ICD-10-CM

## 2022-09-14 MED ORDER — lidocaine (PF) (XYLOCAINE) 10 mg/mL (1 %) 3 mL
10 | Freq: Once | INTRAMUSCULAR | Status: AC
Start: 2022-09-14 — End: 2022-09-14
  Administered 2022-09-14: 20:00:00 via INTRAMUSCULAR

## 2022-09-14 NOTE — Progress Notes (Signed)
 Subjective:      Patient ID: Ashley Madden is a 89 y.o. female.  Chief Complaint   Patient presents with    New Patient Visit/ Consultation       HPI   Ashley Madden is a very pleasant 89 year old lady with a history of HTN, osteoarthritis who presents to clinic. The patient reports neck pain in which it has affected her daily activity after a fall 2 years ago. ESI has not provided relief. This pain feels like pressure. The pain radiates to her shoulders bilaterally. Sitting provides relief. Denies radiating arm pain. Denies numbness. PT has provided transient relief. Denies bowel or bladder concerns.         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    She has a past surgical history that includes Breast biopsy.    Her family history includes Breast Cancer (age of onset: 51) in her sister.    She reports that she has never smoked. She has never used smokeless tobacco. She reports current alcohol use. She reports that she does not use drugs.      Review of Systems  Review of systems: The patients presenting symptoms and review of symptoms are as noted in the history and physical and also as documented in the Epic chart. All other systems are reviewed and found noncontributory.   Allergies:   Patient has no known drug allergies or adverse reactions.    Medications:     Outpatient Encounter Medications as of 09/14/2022   Medication Sig Dispense Refill    acetaminophen (TYLENOL) 500 MG tablet Take 1 tablet (500 mg total) by mouth every 6 hours as needed.      aspirin 81 MG EC tablet Take 1 tablet (81 mg total) by mouth daily.      atenolol (TENORMIN) 50 MG tablet Take 1 tablet (50 mg total) by mouth daily. 30 tablet 0    atorvastatin (LIPITOR) 20 MG tablet Take 1 tablet by mouth daily  0    calcipotriene (DOVONOX) 0.005 % ointment Apply twice daily to involved areas for 5 days      calcium citrate/vitamin D3 (CITRACAL + D ORAL)  Take 100 mg by mouth daily.      cyanocobalamin (VITAMIN B-12) 1000 MCG tablet Take 1 tablet (1,000 mcg total) by mouth daily. Indications: under the tongue      dicyclomine (BENTYL) 10 MG capsule Take 1 capsule (10 mg total) by mouth if needed. Take one capsule by mouth every 6 hours as needed for abdominal cramping 30 capsule 3    fluticasone propion-salmeteroL (WIXELA INHUB) 250-50 mcg/dose Inhale 1 puff into the lungs 2 times a day. 60 each 3    hydroCHLOROthiazide (HYDRODIURIL) 25 MG tablet Take 1 tablet (25 mg total) by mouth daily.      HYDROcodone-acetaminophen (NORCO) 5-325 mg per tablet Take 1 tablet by mouth every 6 hours as needed for Pain.      levothyroxine (SYNTHROID) 88 MCG tablet Take 1 tablet (88 mcg total) by mouth every morning before breakfast.      mometasone (ELOCON) 0.1 % cream APPLY EVERY DAY AS NEEDED FOR INVOLVED LEG RAS      multivit-iron-min-folic acid (CENTRUM) 3,500-18-0.4 unit-mg-mg Chew Chew 1 tablet by mouth daily.      psyllium (METAMUCIL) packet Take 1 packet by mouth daily.       No facility-administered encounter medications on file  as of 09/14/2022.        Objective:       There were no vitals taken for this visit.  Neurologic Exam     Motor Exam     Strength   Strength 5/5 except as noted.     Physical Exam    Today's body mass index is unknown because there is no height or weight on file.       Gen: well developed, well nourished, NAD, A&O   HEENT: normocephalic, atraumatic  Neck: supple  Chest: symmetric chest rise, non-labored breathing  CV: RRR  Abd: Soft, NT, ND  Neuro:   Psychiatric: normal mood, behavior appropriate  Skin: no rashes      Imaging:     MRI 07/11/21  Demonstrates C3-7 multilevel foraminal narrowing       Assessment / Plan     Ashley Madden is a very pleasant 89 year old lady with a history of HTN, osteoarthritis who presents to clinic. The patient reports neck pain in which it has affected her daily activity after a fall 2 years ago. ESI has not provided  relief. This pain feels like pressure. The pain radiates to her shoulders bilaterally. Sitting provides relief. Denies radiating arm pain.     - Review of MRI 07/11/21 demonstrates C3-7 multilevel foraminal narrowing    - Given the patients symptoms, we will recommend conservative treatment including trigger point injection. Patient to follow-up thereafter.         Number and Complexity of Problems (Level 4)  During this encounter, I addressed 1 or more chronic illnesses with exacerbation, progression, or side effects of treatment      Amount and/or Complexity of Data Ordered, Reviewed, or Analyzed (Level 4)  I reviewed 2 external note(s) from: 09/01/21, 10/30/21.  I reviewed 1 test(s) including:mri.      Risk of Complication and/or Morbidity or Mortality of Patient Management (Level  3)  The patient's management entails Low risk of complications and/or morbidity or mortality.    Overall LOS:  4       Scribe Attestation  By signing my name below, I, [facility], attest that this documentation has been prepared under the direction and in the presence of Reubin Milan, MD   Scribe name: Karleen Hampshire [facility] Date: 09/14/2022  ----  Attending Physician Attestation     The scribes documentation has been prepared under my direction and personally reviewed by me in its entirety. I confirm that the note above accurately reflects all work, treatment, procedures, and medical decision making performed by me.      Reubin Milan, MD   11:08 PM, 09/17/2022

## 2022-09-14 NOTE — H&P (Addendum)
Patient with previous neck surgery with Dr. Lourena Simmonds who has continued myofascial neck and upper trap pain    A trigger point injection was performed at the site of maximal tenderness using 1% plain Lidocaine. This was well tolerated, and followed by immediate relief of pain.     Muscles injected included upper traps and paracervicals    Will call in 2 weeks to determine how effective injections were.     Florinda Marker, MD  Rehabilitation Transition Medicine  Physical Medicine and Rehabilitation

## 2022-09-14 NOTE — Patient Instructions (Signed)
An After Visit Summary was printed and given to the patient.    Follow up PRN.

## 2022-09-22 NOTE — Telephone Encounter (Signed)
Spoke to patient. Patient reports some improvement in pain and stiffness in the upper back and neck. Patient reports 40-50% improvement in the pain. Patient reports having 32 one on one PT sessions and continues to do the neck exercises at home. RN will discuss with Dr. Chestine Spore and follow up with patient regarding next steps.

## 2022-09-23 NOTE — Telephone Encounter (Signed)
Per patient, has completed PT. Scheduled for TPI with Dr. Chestine Spore on 10/28/2022.

## 2022-10-28 ENCOUNTER — Ambulatory Visit: Admit: 2022-10-28 | Discharge: 2022-11-09 | Payer: MEDICARE

## 2022-10-28 DIAGNOSIS — M7918 Myalgia, other site: Secondary | ICD-10-CM

## 2022-10-28 DIAGNOSIS — M542 Cervicalgia: Secondary | ICD-10-CM

## 2022-10-28 MED ORDER — betamethasone acetate-betamethasone sodium phosphate (CELESTONE) injection 12 mg
6 | Freq: Once | INTRAMUSCULAR | Status: AC
Start: 2022-10-28 — End: 2022-10-28
  Administered 2022-10-28: 21:00:00 via INTRA_ARTICULAR

## 2022-10-28 MED ORDER — lidocaine (PF) (XYLOCAINE) 10 mg/mL (1 %) 3 mL
10 | Freq: Once | INTRAMUSCULAR | Status: AC
Start: 2022-10-28 — End: 2022-10-28
  Administered 2022-10-28: 21:00:00 via INTRAMUSCULAR

## 2022-10-28 NOTE — Procedures (Addendum)
Trigger Point Injection:  Performed by: Florinda Marker, MD      Pre-operative diagnosis: myofascial pain complicated by post herpetic neuralgia  Post-operative diagnosis: myofascial pain    After risks and benefits were explained including bleeding, infection, worsening of the pain, damage to the area being injected, weakness, allergic reaction to medications, vascular injection, and nerve damage, signed consent was obtained. All questions were answered.          A trigger point injection was performed at the site of maximal tenderness along the upper trapezius and midline cervical paraspinal region using 1% plain Lidocaine and betamethasone. This was well tolerated, and followed by some relief of pain.    Administrations This Visit       betamethasone acetate-betamethasone sodium phosphate (CELESTONE) injection 12 mg       Admin Date  10/28/2022  17:15 Action  Given Dose  12 mg Route  Intra-articular Site   Documented By  Lottie Dawson, MD    Ordering Provider: Lottie Dawson, MD    NDC: [phone]    Lot#: [id number]    Manufacturer: [employer]    Patient Supplied?: No              lidocaine (PF) (XYLOCAINE) 10 mg/mL (1 %) 3 mL       Admin Date  10/28/2022  17:16 Action  Given Dose  3 mL Route  Injection Site   Documented By  Lottie Dawson, MD    Ordering Provider: Lottie Dawson, MD    NDC: [phone]    Lot#: [id number]    Manufacturer: [employer]    Patient Supplied?: No                    Florinda Marker, MD  Rehabilitation Transition Medicine  Physical Medicine and Rehabilitation

## 2022-10-28 NOTE — Patient Instructions (Signed)
Try this gel for your neck pain

## 2022-11-17 ENCOUNTER — Ambulatory Visit: Admit: 2022-11-17 | Discharge: 2022-11-23 | Payer: MEDICARE

## 2022-11-17 DIAGNOSIS — M4135 Thoracogenic scoliosis, thoracolumbar region: Secondary | ICD-10-CM

## 2022-11-17 NOTE — Patient Instructions (Signed)
An After Visit Summary was printed and given to the patient.    Referral provided for physical therapy.     Follow-up with PM&R Regarding occipital nerve block.    Follow-up via telephone prior to Christmas if needed.

## 2022-11-17 NOTE — Telephone Encounter (Signed)
 Patient called requesting an update. Please advise.

## 2022-11-17 NOTE — Progress Notes (Unsigned)
Subjective:      Patient ID: Ashley Madden is a 89 y.o. female.  Chief Complaint   Patient presents with    Follow-up       HPI   Ashley Madden is a very pleasant 89 year old lady who presents to clinic. The patient reports continued neck pain felt as a pressure/pushing in which it has affected her daily activity after a fall 2 years ago. Laying down relieves her pain. Recent trigger point injections provided transient relief. ESI has not provided relief. She reports she is unable to walk due to the pain. Denies numbness. PT has provided transient relief. Denies bowel or bladder concerns.          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    She has a past surgical history that includes Breast biopsy.    Her family history includes Breast Cancer (age of onset: 28) in her sister.    She reports that she has never smoked. She has never used smokeless tobacco. She reports current alcohol use. She reports that she does not use drugs.      Review of Systems  Review of systems: The patients presenting symptoms and review of symptoms are as noted in the history and physical and also as documented in the Epic chart. All other systems are reviewed and found noncontributory.   Allergies:   Patient has no known drug allergies or adverse reactions.    Medications:     Outpatient Encounter Medications as of 11/17/2022   Medication Sig Dispense Refill    acetaminophen (TYLENOL) 500 MG tablet Take 1 tablet (500 mg total) by mouth every 6 hours as needed.      aspirin 81 MG EC tablet Take 1 tablet (81 mg total) by mouth daily.      atenolol (TENORMIN) 50 MG tablet Take 1 tablet (50 mg total) by mouth daily. 30 tablet 0    atorvastatin (LIPITOR) 20 MG tablet Take 1 tablet by mouth daily  0    calcipotriene (DOVONOX) 0.005 % ointment Apply twice daily to involved areas for 5  days      calcium carbonate (OS-CAL) 600 mg calcium (1,500 mg) Tab Take 600 mg by mouth 2 times a day with meals.      calcium citrate/vitamin D3 (CITRACAL + D ORAL) Take 100 mg by mouth daily.      cyanocobalamin (VITAMIN B-12) 1000 MCG tablet Take 1 tablet (1,000 mcg total) by mouth daily. Indications: under the tongue      dicyclomine (BENTYL) 10 MG capsule Take 1 capsule (10 mg total) by mouth if needed. Take one capsule by mouth every 6 hours as needed for abdominal cramping 30 capsule 3    fluticasone propion-salmeteroL (WIXELA INHUB) 250-50 mcg/dose Inhale 1 puff into the lungs 2 times a day. 60 each 3    gabapentin (NEURONTIN) 100 MG capsule Take 1 capsule (100 mg total) by mouth 3 times a day. Two in the morning and one in mid afternoon and then one at bedtime      hydroCHLOROthiazide (HYDRODIURIL) 25 MG tablet Take 1 tablet (25 mg total) by mouth daily.      HYDROcodone-acetaminophen (NORCO) 5-325 mg per tablet Take 1 tablet by mouth every 6 hours as needed for Pain.      immune globulin,gamma,IgG, (IMMUNE GLOBULIN, HUMAN,, IGG, IV) Intravenous once as needed.  levothyroxine (SYNTHROID) 88 MCG tablet Take 1 tablet (88 mcg total) by mouth every morning before breakfast.      linaCLOtide (LINZESS) 145 mcg Cap Take 1 capsule (145 mcg total) by mouth once as needed.      methocarbamoL (ROBAXIN) 500 MG tablet Take 1 tablet (500 mg total) by mouth 2 times a day.      mometasone (ELOCON) 0.1 % cream APPLY EVERY DAY AS NEEDED FOR INVOLVED LEG RAS      multivit-iron-min-folic acid (CENTRUM) 3,500-18-0.4 unit-mg-mg Chew Chew 1 tablet by mouth daily.      multivit-min/iron/FA/vit K/lut (CENTRUM SILVER WOMEN ORAL) Take by mouth once as needed.      psyllium (METAMUCIL) packet Take 1 packet by mouth daily.      traMADoL (ULTRAM) 50 mg tablet Take 1 tablet (50 mg total) by mouth every 6 hours as needed for Pain.       No facility-administered encounter medications on file as of 11/17/2022.        Objective:        Madden pressure 118/72, pulse 73, temperature 97.1 F (36.2 C), temperature source Temporal, height 5' 1 (1.549 m), weight 129 lb 9.6 oz (58.8 kg), SpO2 97%.  Neurologic Exam     Motor Exam     Strength   Strength 5/5 throughout.     Physical Exam  Neurological:      Motor: Motor strength is normal.        Today's body mass index is 24.49 kg/m. Bmi deferred      Imaging:     There is no new relevant imaging at this time.       Assessment / Plan   ***  Ashley Madden is a very pleasant 89 year old lady who presents to clinic. The patient reports continued neck pain radiating to the top of her head is felt as a pressure/pushing in which it has affected her daily activity after a fall 2 years ago. Recent trigger point injections provided transient relief.      - Review of MRI 07/11/21 demonstrates C3-7 multilevel foraminal narrowing     - Given the patients continued radiating head pain, we will refer for greater occipital nerve block for pain relief. Patient to follow-up via telephone thereafter.     - Start PT for extension exercises    - Start aqua therapy         Number and Complexity of Problems (Level 4)  During this encounter, I addressed 1 or more chronic illnesses with exacerbation, progression, or side effects of treatment      Amount and/or Complexity of Data Ordered, Reviewed, or Analyzed (Level 4)  I reviewed 3+ external note(s) from: 09/14/22, 11/05/22, 11/04/22.      Risk of Complication and/or Morbidity or Mortality of Patient Management (Level  3)  The patient's management entails Low risk of complications and/or morbidity or mortality.    Overall LOS:  4      Total time spent with patient was 22 minutes.  Greater than 50% of that time was spent counseling and coordinating the care of the patient.  Please see my assessment and plan for details.     Scribe Attestation  By signing my name below, I, Aliyana Dlugosz [facility], attest that this documentation has been prepared under the direction and in the  presence of Reubin Milan, MD   Scribe name: Karleen Hampshire [facility] Date: 11/17/2022  ----  Attending Physician Attestation     ***

## 2022-11-17 NOTE — Telephone Encounter (Signed)
Patient called to schedule an injection with Dr. Chestine Spore. Requesting a call back, please advise.    Patient's Daughter    Aurea Graff [phone]

## 2022-11-18 NOTE — Telephone Encounter (Signed)
Duplicate encounter

## 2022-11-18 NOTE — Telephone Encounter (Signed)
Dr. Chestine Spore doesn't do occipital nerve blocks. Pending response to see which provider can do the block.

## 2022-11-18 NOTE — Addendum Note (Signed)
Addended by: Patrecia Pace on: 11/18/2022 03:11 PM     Modules accepted: Orders

## 2022-11-18 NOTE — Telephone Encounter (Signed)
Pts spouse called, he states Lourena Simmonds urged them to schedule a nerve block with [facility] and to get the pt in ASAP. He is requesting call back. He states it doesn't matter the location Chestine Spore is at, they are willing to go anywhere to get the pt in sooner. Please call him back to set this up, please advise.

## 2022-11-18 NOTE — Addendum Note (Signed)
Addended by: Gertha Calkin on: 11/18/2022 03:07 PM     Modules accepted: Orders

## 2022-11-18 NOTE — Telephone Encounter (Signed)
Spoke to pt and her spouse about previous encounter. Explain to them that Dr Chestine Spore do not do occipital nerve blocks and that her colleague Dr Casimer Leek is able to do it. PT spouse wanted procedure done today or tomorrow. Told spouse that procedure has to get approve first. Spouse suggested that pt can get nerve block done in [place] and needed an order.     Order has been place and pt will take order to [place].

## 2022-11-22 NOTE — Telephone Encounter (Signed)
Spoke to pt and she wants order for Aqua Therapy and nerve block referral fax to Dr. Waynard Reeds in [place].     Referrals will be faxed once Aqua Order has been sign.

## 2022-11-22 NOTE — Telephone Encounter (Signed)
Pt calling  Pt needs referral emailed to her   Email [email or web address]  Pt has appt with MD in [place] on Thurs day so she needs this sent ASAP    Pt [phone]

## 2022-11-22 NOTE — Telephone Encounter (Signed)
Pt  called. Requesting to know if Dr. Lourena Simmonds has any recommendations for a Physical therapist, and Physician that does occipital nerve blocks in [facility].    Pt states she is currently residing in [facility]    Please return call to pt at [phone].

## 2023-01-22 IMAGING — MR MRI CERVICAL SPINE WITHOUT CONTRAST
7 of 12 series · 11 of 48 positions shown · IV contrast (gadolinium)
Comparison: None

________________________________________________________________________________________________ 
MRI CERVICAL SPINE WITHOUT CONTRAST, 01/22/2023 [DATE]: 
CLINICAL INDICATION: Other cervical disc degeneration, unsp cervical region , 
neck pain radiates into left shoulder and arm. No trauma. History of breast 
cancer.
TECHNIQUE: Multiplanar, multiecho position MR images of the cervical spine were 
performed without intravenous gadolinium enhancement. Patient was scanned on a 
1.5T magnet.

[Series 101: survey · axial · 10.0mm · 1.25mm/px · z∈[-6,+200]mm · 2 of 10 slices shown]
[im 1/10]
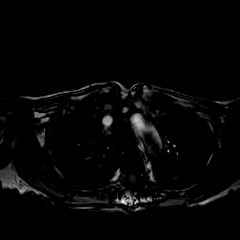
[im 10/10]
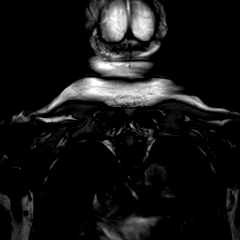

[Series 201: t2w_cor-surv · coronal · 5.0mm · 0.69mm/px · 2 of 7 slices shown]
[im 1/7]
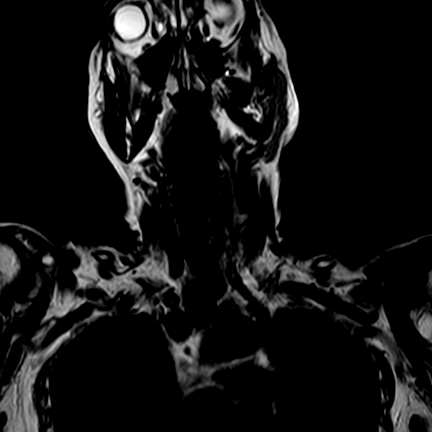
[im 7/7]
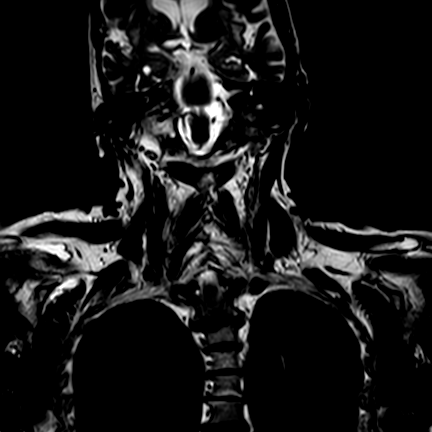

[Series 301: T1 · sagittal · 3.0mm · 0.39mm/px · 1 of 15 slices shown]
[im 1/15]
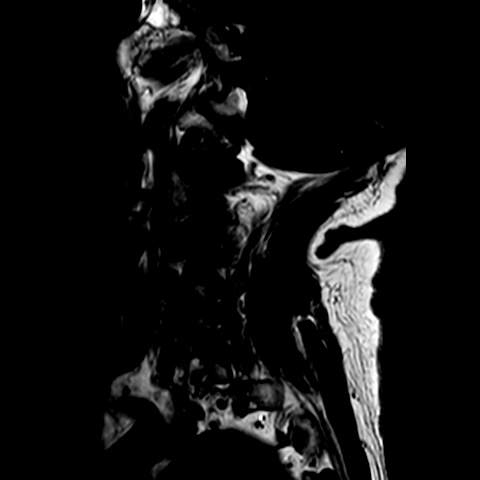

[Series 402: (id)_mdixon_tse · sagittal · 3.0mm · 0.35mm/px · 1 of 15 slices shown]
[im 1/15]
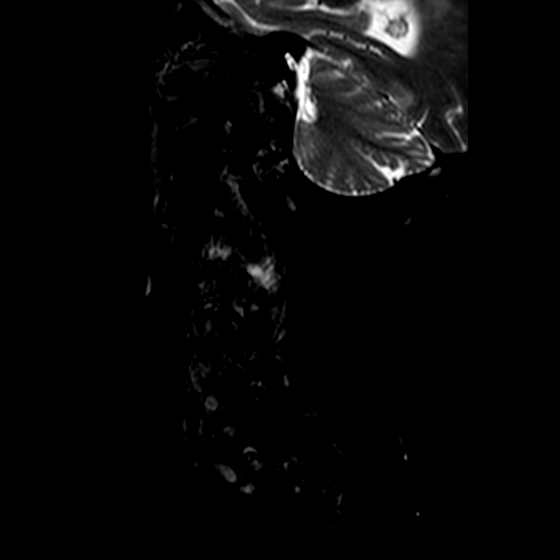

[Series 403: st2w_mdixon_tse · sagittal · 3.0mm · 0.35mm/px · 1 of 15 slices shown]
[im 1/15]
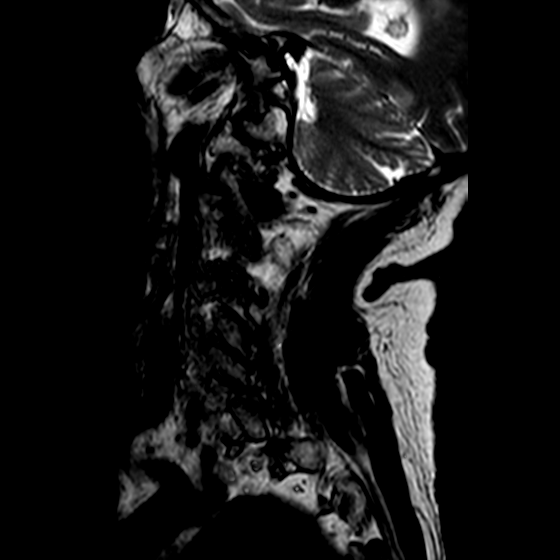

[Series 504: csp left oblq · oblique · 1.0mm · 0.15mm/px · 2 of 36 slices shown]
[im 1/36]
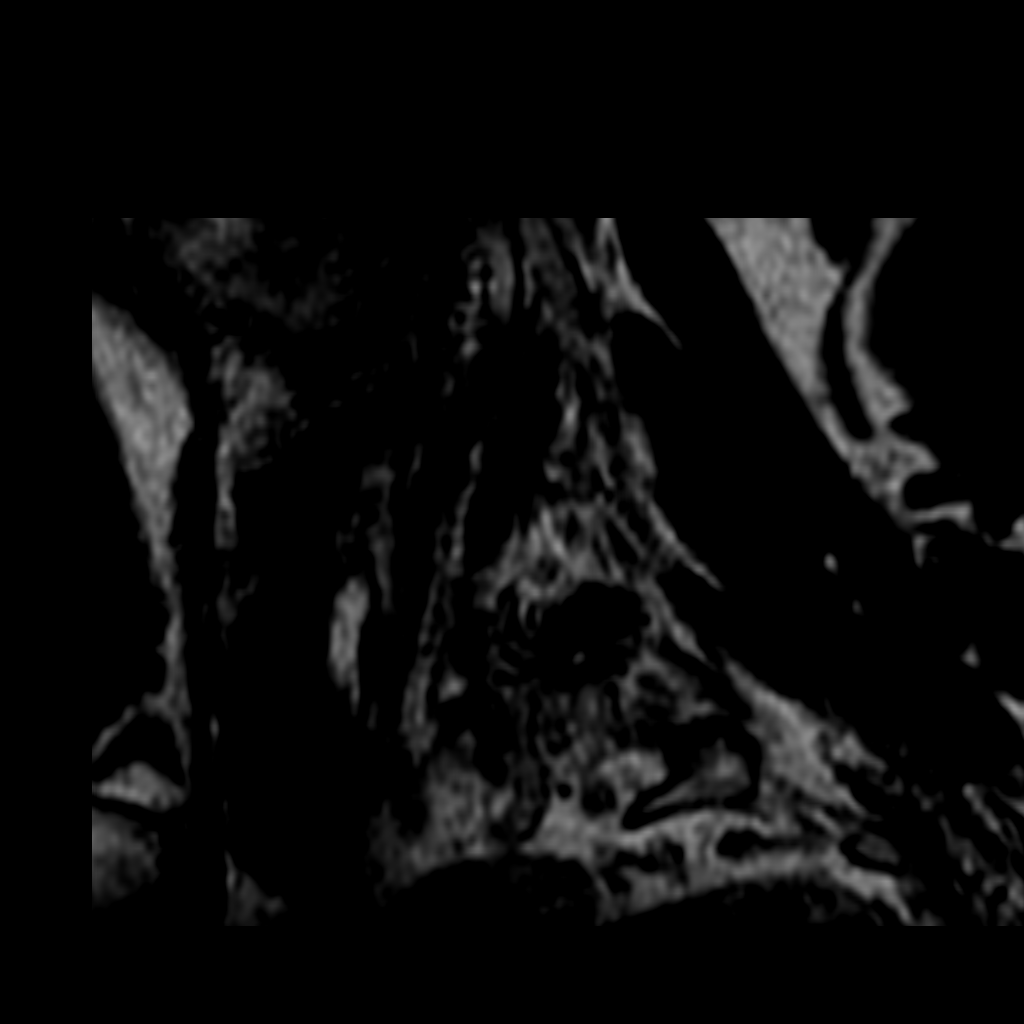
[im 18/36]
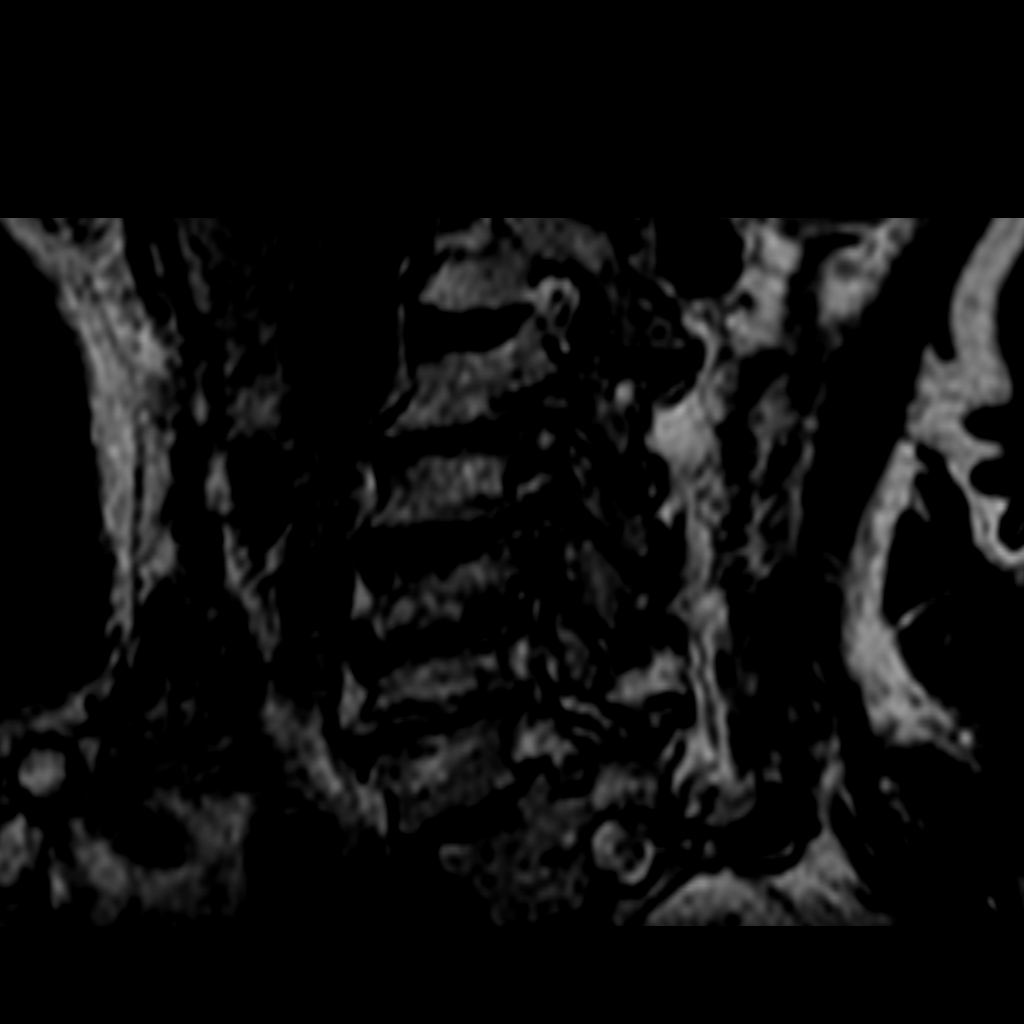

[Series 601: T2 · oblique · 3.0mm · 0.29mm/px · 2 of 18 slices shown]
[im 1/18]
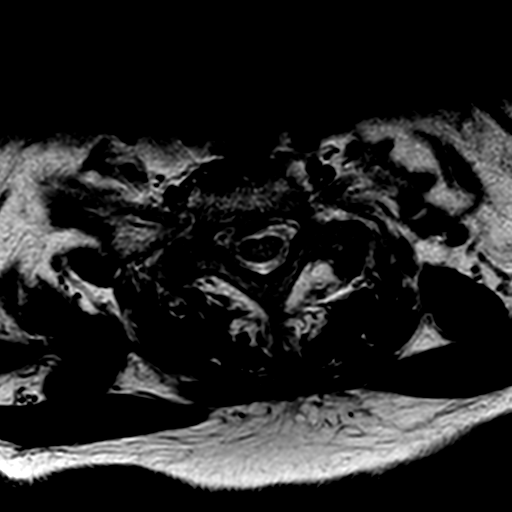
[im 18/18]
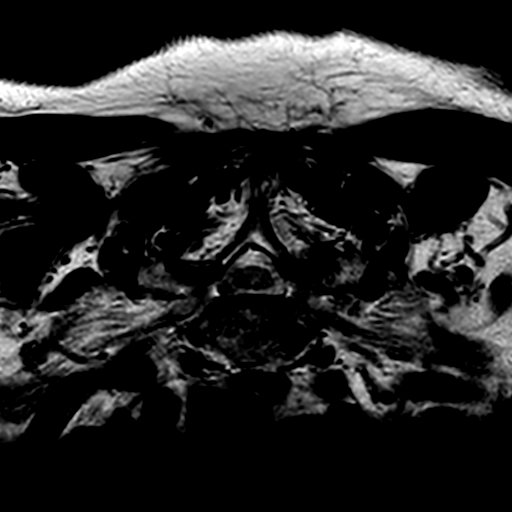

[11 of 48 positions shown; findings below may reference images not displayed]

FINDINGS: -------------------------------------------------------------------------------- 
----------------- 
GENERAL: 
ALIGNMENT: Mild levoconvex upper thoracic scoliosis. Anatomic coronal alignment 
within the cervical spine. Grade 1 anterolisthesis C4 on C5 with grade 1 
retrolisthesis C5 on C6 and C6 on C7. Grade 1 anterolisthesis C7 on T1. 
VERTEBRAL BODY HEIGHT: Moderate chronic appearing superior endplate compression 
deformity of T12.  
MARROW SIGNAL: No evidence of a marrow infiltrative process. Type I endplate 
signal changes C3-C4, C5-C6 and C6-C7 and C7-T1. There is mild facet edema 
involving the right C3-C4 facet and associated facet soft tissues. 
CORD SIGNAL: Normal.  
ADDITIONAL FINDINGS: 2 mm pontine infarct. 
-------------------------------------------------------------------------------- 
---------------- 
SEGMENTAL: 
CRANIOCERVICAL JUNCTION: No significant stenosis. 
C2-C3: Ligamentum flavum hypertrophy. Slight ventral cord flattening. Borderline 
canal stenosis. Facet arthropathy. Foramina patent. 
C3-C4: Loss of disc height with disc osteophyte complex and ligamentum flavum 
hypertrophy creating ventral cord flattening and mild to moderate canal 
stenosis. Uncinate spurring and facet arthropathy with mild right and moderate 
to severe left foraminal narrowing. 
C4-C5: Slight loss of disc height along the posterior disc margin. Ligamentum 
flavum hypertrophy. Disc osteophyte complex with ventral cord distortion and 
ventral cord flattening and mild canal stenosis. Uncinate spurring with facet 
arthropathy with mild right and moderate left foraminal narrowing. 
C5-C6: Loss of disc height. Disc osteophyte complex with ligamentum flavum 
hypertrophy and ventral cord flattening. Mild to moderate canal stenosis. 
Uncinate spurring with facet arthropathy with moderate to severe bilateral 
foraminal narrowing. 
C6-C7: Loss of disc height. Ventral cord flattening with mild canal stenosis. 
Bilateral uncinate spurring and facet arthropathy with moderately severe right 
and severe left foraminal narrowing. 
C7-T1: Loss of disc height. Ventral cord flattening. Canal otherwise patent. 
Facet arthropathy. Foramina patent. 
No significant or critical canal or foraminal stenosis within the upper thoracic 
spine. 
-------------------------------------------------------------------------------- 
---------------
IMPRESSION: Multilevel cervical degenerative changes. 
Facet edema right C3-C4 facet and associated facet soft tissues may be reactive. 
Mild to moderate canal stenosis C3-C4 and C5-C6. Evidence of ventral cord 
flattening C2-C3 through C6-C7. 
Multilevel significant foraminal narrowing as above.

## 2023-07-15 NOTE — Telephone Encounter (Signed)
 Formatting of this note might be different from the original.   AS PER DR. COSETTE ASKED  FOR INVITAE DIAGNOSTIC TESTING RESULTS TO BE SCANNED INTO PATIENT CHART.   Electronically signed by Maranda Polite, [place] at 07/15/2023 10:29 AM EDT

## 2023-08-09 ENCOUNTER — Ambulatory Visit: Admit: 2023-08-09 | Discharge: 2023-08-14 | Payer: MEDICARE

## 2023-08-09 DIAGNOSIS — M5481 Occipital neuralgia: Principal | ICD-10-CM

## 2023-08-09 NOTE — Progress Notes (Signed)
Subjective:      Patient ID: Ashley Madden is a 89 y.o. female.  No chief complaint on file.      HPI   Ashley Madden is a very pleasant 89 year old lady who presents to clinic. The patient reports continued neck pain in her neck which she describes as a pressure/pushing sensation, radiating down into her R>L shoulders in the setting of occipital neuralgia. She notes that her most recent greater occipital nerve block as well as PT have not provided relief. She is unable to keep fixed horizontal gaze while ambulating. Denies bowel or bladder concerns.          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    She has a past surgical history that includes Breast biopsy.    Her family history includes Breast Cancer (age of onset: 4) in her sister.    She reports that she has never smoked. She has never used smokeless tobacco. She reports current alcohol use. She reports that she does not use drugs.      Review of Systems  Review of systems: The patients presenting symptoms and review of symptoms are as noted in the history and physical and also as documented in the Epic chart. All other systems are reviewed and found noncontributory.   Allergies:   Patient has no known drug allergies or adverse reactions.    Medications:   Encounter Medications[1]     Objective:       There were no vitals taken for this visit.  Neurological Exam    Motor   Strength is 5/5 throughout all four extremities.    Physical Exam  Neurological:      Motor: Motor strength is normal.      Full strength   @Today 's body mass index is unknown because there is no height or weight on file. Bmi deferred     Imaging:    There is no new relevant imaging at this time.         Assessment / Plan     Ashley Madden is a very pleasant 89 year old lady who presents to clinic. The patient reports continued  neck pain radiating to the top of her head is felt as a pressure/pushing that radiates down into her R>L shoulders in the setting of occipital neuralgia.     - Given patient is having symptoms of occipital neuralgia, we will refer her to Dr. Norleen Deward Job for consideration for an occipital nerve stimulator.          Number and Complexity of Problems (Level 4)  During this encounter, I addressed 1 or more chronic illnesses with exacerbation, progression, or side effects of treatment      Amount and/or Complexity of Data Ordered, Reviewed, or Analyzed (Level 2)  I reviewed 1 external note(s) from: 11/17/22.      Risk of Complication and/or Morbidity or Mortality of Patient Management (Level  5)  The patient's management entails High risk of complications and/or morbidity or mortality.    Overall LOS:  4      Total time spent with patient was 12 minutes.  Greater than 50% of that time was spent counseling and coordinating the care of the patient.  Please see my assessment and plan for details.     Scribe Attestation  By signing my name below, I,  BRETT MURCKO, attest that this documentation has been prepared under the direction and in the presence of Adolphus Norlander, MD   Scribe name: HOSEY FURTH Date: 08/09/2023  ----  Attending Physician Attestation     The scribes documentation has been prepared under my direction and personally reviewed by me in its entirety. I confirm that the note above accurately reflects all work, treatment, procedures, and medical decision making performed by me.      Adolphus Norlander, MD   8:23 AM, 08/10/2023              [1]   Outpatient Encounter Medications as of 08/09/2023   Medication Sig Dispense Refill    acetaminophen  (TYLENOL ) 500 MG tablet Take 1 tablet (500 mg total) by mouth every 6 hours as needed.      aspirin 81 MG EC tablet Take 1 tablet (81 mg total) by mouth daily.      atenolol  (TENORMIN ) 50 MG tablet Take 1 tablet (50 mg total) by mouth daily. 30 tablet 0    atorvastatin  (LIPITOR) 20 MG tablet Take 1 tablet by mouth daily  0    calcipotriene (DOVONOX) 0.005 % ointment Apply twice daily to involved areas for 5 days      calcium carbonate (OS-CAL) 600 mg calcium (1,500 mg) Tab Take 600 mg by mouth 2 times a day with meals.      calcium citrate/vitamin D3 (CITRACAL + D ORAL) Take 100 mg by mouth daily.      cyanocobalamin (VITAMIN B-12) 1000 MCG tablet Take 1 tablet (1,000 mcg total) by mouth daily. Indications: under the tongue      dicyclomine  (BENTYL ) 10 MG capsule Take 1 capsule (10 mg total) by mouth if needed. Take one capsule by mouth every 6 hours as needed for abdominal cramping 30 capsule 3    fluticasone  propion-salmeteroL (WIXELA INHUB ) 250-50 mcg/dose Inhale 1 puff into the lungs 2 times a day. 60 each 3    gabapentin (NEURONTIN) 100 MG capsule Take 1 capsule (100 mg total) by mouth 3 times a day. Two in the morning and one in mid afternoon and then one at bedtime      hydroCHLOROthiazide (HYDRODIURIL) 25 MG tablet Take 1 tablet (25 mg total) by mouth daily.      HYDROcodone-acetaminophen  (NORCO) 5-325 mg per tablet Take 1 tablet by mouth every 6 hours as needed for Pain.      immune globulin ,gamma,IgG, (IMMUNE GLOBULIN , HUMAN,, IGG, IV) Intravenous once as needed.      levothyroxine (SYNTHROID) 88 MCG tablet Take 1 tablet (88 mcg total) by mouth every morning before breakfast.      linaCLOtide (LINZESS) 145 mcg Cap Take 1 capsule (145 mcg total) by mouth once as needed.      methocarbamoL (ROBAXIN) 500 MG tablet Take 1 tablet (500 mg total) by mouth 2 times a day.      mometasone (ELOCON) 0.1 % cream APPLY EVERY DAY AS NEEDED FOR INVOLVED LEG RAS      multivit-iron -min-folic acid (CENTRUM) 3,500-18-0.4 unit-mg-mg Chew Chew 1 tablet by mouth daily.      multivit-min/iron /FA/vit K/lut (CENTRUM SILVER WOMEN ORAL) Take by mouth once as needed.      psyllium (METAMUCIL) packet Take 1 packet by mouth daily.      traMADoL (ULTRAM) 50 mg tablet Take 1 tablet (50 mg total) by  mouth every 6 hours as needed for Pain.       No facility-administered encounter medications on file as of  08/09/2023.

## 2023-08-09 NOTE — Patient Instructions (Addendum)
 An After Visit Summary was printed and given to the patient.    Appointment scheduled with Dr. Celinda.

## 2023-09-27 ENCOUNTER — Ambulatory Visit: Payer: MEDICARE

## 2023-09-27 NOTE — Telephone Encounter (Signed)
 Ashley Madden called states her appointment was cancelled , spoke w/ RN     MD is out sick today , patient will receive a call back regarding rescheduling appointment     Patient verbalized understanding , she can be reached at [phone]

## 2023-09-28 NOTE — Telephone Encounter (Signed)
 Pt is rescheduled to tomorrow at 9:20 am

## 2023-09-28 NOTE — Telephone Encounter (Signed)
 Called pt to reschedule appt to tomorrow at 10 am. Pt did not answer LVM with call back number

## 2023-09-29 ENCOUNTER — Ambulatory Visit: Admit: 2023-09-29 | Discharge: 2023-10-03 | Payer: MEDICARE

## 2023-09-29 ENCOUNTER — Inpatient Hospital Stay: Admit: 2023-09-29 | Discharge: 2023-10-06 | Payer: MEDICARE

## 2023-09-29 DIAGNOSIS — M542 Cervicalgia: Secondary | ICD-10-CM

## 2023-09-29 NOTE — Progress Notes (Signed)
 Chief Complaint: Neck pain - referral for occipital nerve stimulator    History of Present Illness:   Ashley Madden  is a 89 year old woman who has pain when she holds her head upright. The pain starts at her lower occiput and goes down her neck and to her shoulder on the left side. She has undergone physical therapy. She notes that after some treatments, such as an inversion table, her pain improved for some time and she was able to hold her head up for a few hours afterwards. But mostly, her head hangs forward when she walks and it hurts to pull it back. Her balance is getting worse. She used to walk 2 miles per day, but now she is more limited due to her difficulty looking ahead. There was some concern from other providers that this could be occipital neuralgia. She underwent an occipital nerve block which she said did not help. Her pain is only when she stands and tries to hold her head up to look forward. It is in the back of her neck    Allergies  Allergies[1]    Meds  Current Outpatient Medications   Medication Sig    acetaminophen  Take 1 tablet (500 mg total) by mouth every 6 hours as needed.    aspirin Take 1 tablet (81 mg total) by mouth daily.    atenoloL  Take 1 tablet (50 mg total) by mouth daily.    atorvastatin Take 1 tablet by mouth daily    calcium carbonate Take 600 mg by mouth 2 times a day with meals.    calcium citrate/vitamin D3 (CITRACAL + D ORAL) Take 100 mg by mouth daily.    cyanocobalamin Take 1 tablet (1,000 mcg total) by mouth daily. Indications: under the tongue    dicyclomine  Take 1 capsule (10 mg total) by mouth if needed. Take one capsule by mouth every 6 hours as needed for abdominal cramping    fluticasone  propion-salmeteroL Inhale 1 puff into the lungs 2 times a day.    gabapentin Take 1 capsule (100 mg total) by mouth 3 times a day. Two in the morning and one in mid afternoon and then one at bedtime    hydroCHLOROthiazide Take 1 tablet (25 mg total) by mouth daily.     levothyroxine Take 1 tablet (88 mcg total) by mouth every morning before breakfast.    methocarbamoL Take 1 tablet (500 mg total) by mouth 2 times a day.    mometasone APPLY EVERY DAY AS NEEDED FOR INVOLVED LEG RAS    multivit-min/iron /FA/vit K/lut (CENTRUM SILVER WOMEN ORAL) Take by mouth once as needed.    psyllium Take 1 packet by mouth daily.    traMADoL Take 1 tablet (50 mg total) by mouth every 6 hours as needed for Pain.    calcipotriene Apply twice daily to involved areas for 5 days (Patient not taking: Reported on 09/29/2023)    HYDROcodone-acetaminophen  Take 1 tablet by mouth every 6 hours as needed for Pain. (Patient not taking: Reported on 09/29/2023)    immune globulin ,gamma,IgG, (IMMUNE GLOBULIN , HUMAN,, IGG, IV) Intravenous once as needed. (Patient not taking: Reported on 09/29/2023)    linaCLOtide Take 1 capsule (145 mcg total) by mouth once as needed. (Patient not taking: Reported on 09/29/2023)    multivit-iron -min-folic acid Chew 1 tablet by mouth daily. (Patient not taking: Reported on 09/29/2023)     No current facility-administered medications for this visit.       Past Medical History  Past Medical History:   Diagnosis Date    Breast cancer (CMS-HCC)     Immunoglobulin deficiency (CMS-HCC) 07/08/2011    Lymphoma (CMS-HCC) 2024    Postherpetic neuralgia     Shingles        Past Surgical History  Past Surgical History:   Procedure Laterality Date    BREAST BIOPSY         Social History  married  Tobacco Use: Low Risk  (08/10/2023)    Patient History     Smoking Tobacco Use: Never     Smokeless Tobacco Use: Never     Passive Exposure: Not on file         Physical Exam:  Vitals:  Vitals:    09/29/23 0908   BP: 139/70   Pulse: 80   Temp: 95.2 F (35.1 C)   SpO2: 98%         Motor power  Right Upper extremity strength:  Deltoid: 5  Biceps: 5  Triceps: 5  Hand Grip: 5    Left Upper extremity strength:  Deltoid: 5  Biceps: 5  Triceps: 5  Hand Grip: 5    Right Lower extremity strength:  Hip flexion: 5  Knee  extension: 5  Dorsiflexion: 5  Plantar flexion: 5    Left Lower extremity strength:  Hip flexion: 5  Knee extension: 5  Dorsiflexion: 5  Plantar flexion: 5      Radiology:  MRI C-spine (2025 and 2025): Moderate but not severe stenosis. Significant degenerative changes at multiple levels. Supine position, but significant upper thoracic kyphosis on 2023 MRI (cropped out of image in 2025)    Assessment:  Ashley Madden is a 89 y.o. year old woman with chronic neck pain. I do not think this represents occipital neuralgia based on the type of pain, the distribution of the pain, and the apparent trigger of the pain, and the lack of improvement with an occipital nerve block. I suspect the patient is having muscle strain from trying to extend her neck sufficiently to maintain a horizontal gaze, and the cause of this is likely her significant upper thoracic kyphosis. I do not think an occipital nerve stimulator will help her.     She is not interested in spinal deformity correction and this would probably not be reasonable at her age. Plan for referral for PM&R for help with possible adaptive strategies. Will also obtain scoliosis X-rays as these may be of some use to future providers.       Plan:  Physical medicine referral  Scoliosis X-rays     Today's body mass index is 24.87 kg/m. BMI is normal.      I have spent greater than 40 minutes reviewing records and imaging, interviewing and examining the patient, and documenting the encounter.          [1] No Known Drug Allergies or Adverse Reactions

## 2023-09-29 NOTE — Telephone Encounter (Signed)
 Received referral from Dr Celinda for neck pain that starts at her lower occiput and goes down her neck and to her shoulder on the left side   Called patient, left voicemail with scheduling phone number.

## 2023-10-04 ENCOUNTER — Ambulatory Visit: Payer: MEDICARE

## 2023-10-07 ENCOUNTER — Ambulatory Visit: Admit: 2023-10-07 | Discharge: 2023-10-18 | Payer: MEDICARE

## 2023-10-07 DIAGNOSIS — M542 Cervicalgia: Principal | ICD-10-CM

## 2023-10-07 MED ORDER — lidocaine (PF) (XYLOCAINE) 10 mg/mL (1 %) 5 mL
10 | Freq: Once | INTRAMUSCULAR | Status: AC
Start: 2023-10-07 — End: 2023-10-07
  Administered 2023-10-07: 21:00:00 via INTRAMUSCULAR

## 2023-10-07 NOTE — Progress Notes (Unsigned)
 PHYSICAL MEDICINE & REHABILITATION  [facility]  Follow Up Visit    Date of Service: 10/07/2023      Chief Complaint  Chief Complaint   Patient presents with    Back Pain     Neck pain with L should pain        Last Assessment and Plan:   Ashley Madden is a 89 y.o. 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 Celinda for neck and shoulder pain.     Ashley Madden presents with husband who provides additional history and Ashley Madden fully participates in exam     Pt complains of worsening neck and shoulder pain for the last few months, L>R , worse with walking and long periods of standing which is upsetting as she was fairly active, the pain does not radiate, no n/t of extremities. She takes tylenol /aleve/advil with some relief. She was able to trial a C collar and occasionally wears soft neck collar for long trips.       Functional History, Status and Classification:         Therapy Services:     Has worked with PT, they have done myofascial manipulations, tilt tables that provide temporary relief, a few hours to a day.       Medications:     Current Outpatient Medications   Medication Sig    acetaminophen  Take 1 tablet (500 mg total) by mouth every 6 hours as needed.    aspirin Take 1 tablet (81 mg total) by mouth daily.    atenoloL  Take 1 tablet (50 mg total) by mouth daily.    atorvastatin Take 1 tablet by mouth daily    calcium citrate/vitamin D3 (CITRACAL + D ORAL) Take 100 mg by mouth daily.    dicyclomine  Take 1 capsule (10 mg total) by mouth if needed. Take one capsule by mouth every 6 hours as needed for abdominal cramping    fluticasone  propion-salmeteroL Inhale 1 puff into the lungs 2 times a day.    gabapentin Take 1 capsule (100 mg total) by mouth 3 times a day. Two in the morning and one in mid afternoon and then one at bedtime     hydroCHLOROthiazide Take 1 tablet (25 mg total) by mouth daily.    levothyroxine Take 1 tablet (88 mcg total) by mouth every morning before breakfast.    multivit-min/iron /FA/vit K/lut (CENTRUM SILVER WOMEN ORAL) Take by mouth once as needed.    psyllium Take 1 packet by mouth daily. (Patient taking differently: Take 1 packet by mouth daily. PRN)    calcipotriene Apply twice daily to involved areas for 5 days (Patient not taking: Reported on 09/29/2023)    calcium carbonate Take 600 mg by mouth 2 times a day with meals. (Patient not taking: Reported on 10/07/2023)    cyanocobalamin Take 1 tablet (1,000 mcg total) by mouth daily. Indications: under the tongue    HYDROcodone-acetaminophen  Take 1 tablet by mouth every 6 hours as needed for Pain. (Patient not taking: Reported on 09/29/2023)    immune globulin ,gamma,IgG, (IMMUNE GLOBULIN , HUMAN,, IGG, IV) Intravenous once as needed. (Patient not taking: Reported on 09/29/2023)    linaCLOtide Take 1 capsule (145 mcg total) by mouth once as needed. (Patient not taking: Reported on 09/29/2023)    methocarbamoL Take 1 tablet (500 mg total) by mouth 2 times a day.    mometasone APPLY EVERY DAY  AS NEEDED FOR INVOLVED LEG RAS    multivit-iron -min-folic acid Chew 1 tablet by mouth daily. (Patient not taking: Reported on 10/07/2023)    traMADoL Take 1 tablet (50 mg total) by mouth every 6 hours as needed for Pain.     No current facility-administered medications for this visit.          Allergies:   Allergies[1]      Data/Imaging:   X-ray Scoliosis Survey AP-Lat 2-3 vws  Result Date: 10/02/2023  IMPRESSION: 1.  S-shaped scoliotic curvature to the thoracolumbar spine with moderate to severe multilevel thoracolumbar degenerative disc disease and lumbar predominant spondylosis. 2.  Minimal retrolisthesis of L2 on L3. 3.  Moderate to severe right hip osteoarthritis. Report Verified by: Camellia Crock, MD at 10/02/2023 8:13 AM EDT        Physical Examination:     Significant kyphosis while in  seat, neck and trapezius muscles were very tight, nonTTP   Limited ROM, postured in seat to relieve tension on neck     Assessment/Plan:   Ashley Madden is a 89 y.o. with pertinent history of breast cancer (CMS-HCC), immunoglobulin deficiency (CMS-HCC) (07/08/2011), lymphoma (CMS-HCC) (2024), postherpetic neuralgia, and shingles presenting for chronic neck and shoulder pain L>R that has gradually gotten worse over the past 2-3 months with standing and walking. Imaging revealed scoliotic curvature, evident kyphosis on exam while seated. Takes aleve/advil/tylenol  with mild to moderate resolution of symptoms, feels better sitting down when able to hold head/neck up.    Visit Diagnoses:  There are no diagnoses linked to this encounter.       Discussed kyphosis, kyphosis braces and trigger point injections. Agree with Dr. Celinda, pt does not need nerve stimulator at this time.      Pt agreed to look into kyphosis braces and total 5 TPIs performed  (3 L, 2 on R) of occiput and trapezius muscles.     - See body of note for supportive documentation related to level of service  - I reviewed the following prior external notes: neurosurgery   - Total time I spent for E/M services on the date of the encounter: 35 minutes      Needs follow up in  6 months for follow up for relief       Glady Ok, MD  Physical Medicine & Rehabilitation   PGY-2          [1] No Known Drug Allergies or Adverse Reactions

## 2023-10-07 NOTE — Progress Notes (Unsigned)
 PM&R Chart Prep     Patient is scheduled for an Established Patient Appointment -    Date of last appointment with this provider:  10/28/2022  for Trigger point injections    Refer to the provider's last note, specifically the documented Plan.    Have plan/recommendations been completed? Yes.N/A -NO orders were placed at this visit    If orders are incomplete, call the patient to verify orders were not completed outside of [facility].   Patient did have a XR SCOLIOSIS SURVEY AP AND LATERAL 2-3 VIEWS  done on 09/29/23 ordered by Dr. Celinda    Please refer to office note from DR. Sheehy on 09/29/2023    Does this patient require oxygen? No.    Has this patient had a recent fall? Yes.   If yes, will the patient need assistance from staff? No
